# Patient Record
Sex: Female | Born: 1950
Health system: Southern US, Community
[De-identification: ages and names within clinical notes are randomized; demographics above are authoritative.]

## PROBLEM LIST (undated history)

## (undated) DIAGNOSIS — M1990 Unspecified osteoarthritis, unspecified site: Secondary | ICD-10-CM

## (undated) DIAGNOSIS — C4492 Squamous cell carcinoma of skin, unspecified: Secondary | ICD-10-CM

## (undated) DIAGNOSIS — K5900 Constipation, unspecified: Secondary | ICD-10-CM

## (undated) DIAGNOSIS — E785 Hyperlipidemia, unspecified: Secondary | ICD-10-CM

## (undated) DIAGNOSIS — J939 Pneumothorax, unspecified: Secondary | ICD-10-CM

## (undated) DIAGNOSIS — C4491 Basal cell carcinoma of skin, unspecified: Secondary | ICD-10-CM

## (undated) DIAGNOSIS — E538 Deficiency of other specified B group vitamins: Secondary | ICD-10-CM

## (undated) DIAGNOSIS — E559 Vitamin D deficiency, unspecified: Secondary | ICD-10-CM

## (undated) HISTORY — PX: ABDOMINAL HYSTERECTOMY: SHX81

## (undated) HISTORY — PX: KNEE SURGERY: SHX244

## (undated) HISTORY — DX: Basal cell carcinoma of skin, unspecified: C44.91

## (undated) HISTORY — DX: Constipation, unspecified: K59.00

## (undated) HISTORY — DX: Deficiency of other specified B group vitamins: E53.8

## (undated) HISTORY — PX: RECONSTRUCTION OF EYELID: SHX6576

## (undated) HISTORY — DX: Vitamin D deficiency, unspecified: E55.9

## (undated) HISTORY — DX: Hyperlipidemia, unspecified: E78.5

## (undated) HISTORY — PX: WISDOM TOOTH EXTRACTION: SHX21

## (undated) HISTORY — PX: APPENDECTOMY: SHX54

## (undated) HISTORY — DX: Squamous cell carcinoma of skin, unspecified: C44.92

## (undated) HISTORY — DX: Unspecified osteoarthritis, unspecified site: M19.90

## (undated) HISTORY — PX: TUBAL LIGATION: SHX77

## (undated) HISTORY — DX: Pneumothorax, unspecified: J93.9

## (undated) MED FILL — Nivolumab IV Soln 100 MG/10ML: INTRAVENOUS | Qty: 48 | Status: AC

## (undated) MED FILL — Nivolumab IV Soln 100 MG/10ML: INTRAVENOUS | Qty: 21.1 | Status: AC

---

## 2017-10-06 DIAGNOSIS — Z01818 Encounter for other preprocedural examination: Secondary | ICD-10-CM | POA: Diagnosis not present

## 2017-11-04 DIAGNOSIS — F418 Other specified anxiety disorders: Secondary | ICD-10-CM | POA: Diagnosis not present

## 2017-11-04 DIAGNOSIS — R079 Chest pain, unspecified: Secondary | ICD-10-CM | POA: Diagnosis not present

## 2017-11-04 DIAGNOSIS — K219 Gastro-esophageal reflux disease without esophagitis: Secondary | ICD-10-CM | POA: Diagnosis not present

## 2017-11-04 DIAGNOSIS — M1711 Unilateral primary osteoarthritis, right knee: Secondary | ICD-10-CM | POA: Diagnosis not present

## 2017-11-04 DIAGNOSIS — I5033 Acute on chronic diastolic (congestive) heart failure: Secondary | ICD-10-CM | POA: Diagnosis not present

## 2017-11-04 DIAGNOSIS — E7800 Pure hypercholesterolemia, unspecified: Secondary | ICD-10-CM | POA: Diagnosis not present

## 2017-11-05 DIAGNOSIS — E7800 Pure hypercholesterolemia, unspecified: Secondary | ICD-10-CM | POA: Diagnosis not present

## 2017-11-05 DIAGNOSIS — I5033 Acute on chronic diastolic (congestive) heart failure: Secondary | ICD-10-CM | POA: Diagnosis not present

## 2017-11-05 DIAGNOSIS — K219 Gastro-esophageal reflux disease without esophagitis: Secondary | ICD-10-CM | POA: Diagnosis not present

## 2017-11-05 DIAGNOSIS — M1711 Unilateral primary osteoarthritis, right knee: Secondary | ICD-10-CM | POA: Diagnosis not present

## 2017-11-05 DIAGNOSIS — F418 Other specified anxiety disorders: Secondary | ICD-10-CM | POA: Diagnosis not present

## 2017-11-06 DIAGNOSIS — M1711 Unilateral primary osteoarthritis, right knee: Secondary | ICD-10-CM | POA: Diagnosis not present

## 2017-11-06 DIAGNOSIS — I5033 Acute on chronic diastolic (congestive) heart failure: Secondary | ICD-10-CM | POA: Diagnosis not present

## 2017-11-06 DIAGNOSIS — F418 Other specified anxiety disorders: Secondary | ICD-10-CM | POA: Diagnosis not present

## 2017-11-06 DIAGNOSIS — E7800 Pure hypercholesterolemia, unspecified: Secondary | ICD-10-CM | POA: Diagnosis not present

## 2017-11-07 DIAGNOSIS — F418 Other specified anxiety disorders: Secondary | ICD-10-CM | POA: Diagnosis not present

## 2017-11-07 DIAGNOSIS — E7800 Pure hypercholesterolemia, unspecified: Secondary | ICD-10-CM | POA: Diagnosis not present

## 2017-11-07 DIAGNOSIS — I5033 Acute on chronic diastolic (congestive) heart failure: Secondary | ICD-10-CM | POA: Diagnosis not present

## 2017-11-07 DIAGNOSIS — M1711 Unilateral primary osteoarthritis, right knee: Secondary | ICD-10-CM | POA: Diagnosis not present

## 2018-03-11 ENCOUNTER — Encounter: Payer: Self-pay | Admitting: Gastroenterology

## 2020-02-01 DIAGNOSIS — C44722 Squamous cell carcinoma of skin of right lower limb, including hip: Secondary | ICD-10-CM | POA: Diagnosis not present

## 2020-02-01 DIAGNOSIS — C44212 Basal cell carcinoma of skin of right ear and external auricular canal: Secondary | ICD-10-CM | POA: Diagnosis not present

## 2020-02-01 DIAGNOSIS — C44319 Basal cell carcinoma of skin of other parts of face: Secondary | ICD-10-CM | POA: Diagnosis not present

## 2020-02-01 DIAGNOSIS — D485 Neoplasm of uncertain behavior of skin: Secondary | ICD-10-CM | POA: Diagnosis not present

## 2020-02-17 DIAGNOSIS — E349 Endocrine disorder, unspecified: Secondary | ICD-10-CM | POA: Diagnosis not present

## 2020-02-17 DIAGNOSIS — Z1231 Encounter for screening mammogram for malignant neoplasm of breast: Secondary | ICD-10-CM | POA: Diagnosis not present

## 2020-02-17 DIAGNOSIS — Z01419 Encounter for gynecological examination (general) (routine) without abnormal findings: Secondary | ICD-10-CM | POA: Diagnosis not present

## 2020-02-17 DIAGNOSIS — Z780 Asymptomatic menopausal state: Secondary | ICD-10-CM | POA: Diagnosis not present

## 2020-02-17 DIAGNOSIS — F329 Major depressive disorder, single episode, unspecified: Secondary | ICD-10-CM | POA: Diagnosis not present

## 2020-02-21 DIAGNOSIS — C44212 Basal cell carcinoma of skin of right ear and external auricular canal: Secondary | ICD-10-CM | POA: Diagnosis not present

## 2020-02-21 DIAGNOSIS — C44319 Basal cell carcinoma of skin of other parts of face: Secondary | ICD-10-CM | POA: Diagnosis not present

## 2020-02-28 DIAGNOSIS — E7800 Pure hypercholesterolemia, unspecified: Secondary | ICD-10-CM | POA: Diagnosis not present

## 2020-02-28 DIAGNOSIS — Z79899 Other long term (current) drug therapy: Secondary | ICD-10-CM | POA: Diagnosis not present

## 2020-02-28 DIAGNOSIS — R739 Hyperglycemia, unspecified: Secondary | ICD-10-CM | POA: Diagnosis not present

## 2020-03-07 DIAGNOSIS — M4800 Spinal stenosis, site unspecified: Secondary | ICD-10-CM | POA: Diagnosis not present

## 2020-03-07 DIAGNOSIS — Z6827 Body mass index (BMI) 27.0-27.9, adult: Secondary | ICD-10-CM | POA: Diagnosis not present

## 2020-03-07 DIAGNOSIS — J309 Allergic rhinitis, unspecified: Secondary | ICD-10-CM | POA: Diagnosis not present

## 2020-03-07 DIAGNOSIS — E7800 Pure hypercholesterolemia, unspecified: Secondary | ICD-10-CM | POA: Diagnosis not present

## 2020-03-07 DIAGNOSIS — E538 Deficiency of other specified B group vitamins: Secondary | ICD-10-CM | POA: Diagnosis not present

## 2020-03-07 DIAGNOSIS — I10 Essential (primary) hypertension: Secondary | ICD-10-CM | POA: Diagnosis not present

## 2020-03-07 DIAGNOSIS — Z0000 Encounter for general adult medical examination without abnormal findings: Secondary | ICD-10-CM | POA: Diagnosis not present

## 2020-03-13 DIAGNOSIS — H8109 Meniere's disease, unspecified ear: Secondary | ICD-10-CM | POA: Diagnosis not present

## 2020-03-13 DIAGNOSIS — H903 Sensorineural hearing loss, bilateral: Secondary | ICD-10-CM | POA: Diagnosis not present

## 2020-03-20 DIAGNOSIS — C44212 Basal cell carcinoma of skin of right ear and external auricular canal: Secondary | ICD-10-CM | POA: Diagnosis not present

## 2020-03-21 DIAGNOSIS — H8103 Meniere's disease, bilateral: Secondary | ICD-10-CM | POA: Diagnosis not present

## 2020-06-15 DIAGNOSIS — E538 Deficiency of other specified B group vitamins: Secondary | ICD-10-CM | POA: Diagnosis not present

## 2020-06-15 DIAGNOSIS — R5383 Other fatigue: Secondary | ICD-10-CM | POA: Diagnosis not present

## 2020-06-15 DIAGNOSIS — Z6826 Body mass index (BMI) 26.0-26.9, adult: Secondary | ICD-10-CM | POA: Diagnosis not present

## 2020-06-27 DIAGNOSIS — H8103 Meniere's disease, bilateral: Secondary | ICD-10-CM | POA: Diagnosis not present

## 2020-07-14 DIAGNOSIS — D692 Other nonthrombocytopenic purpura: Secondary | ICD-10-CM | POA: Diagnosis not present

## 2020-07-14 DIAGNOSIS — Z85828 Personal history of other malignant neoplasm of skin: Secondary | ICD-10-CM | POA: Diagnosis not present

## 2020-07-14 DIAGNOSIS — L905 Scar conditions and fibrosis of skin: Secondary | ICD-10-CM | POA: Diagnosis not present

## 2020-07-14 DIAGNOSIS — C44311 Basal cell carcinoma of skin of nose: Secondary | ICD-10-CM | POA: Diagnosis not present

## 2020-07-14 DIAGNOSIS — C44722 Squamous cell carcinoma of skin of right lower limb, including hip: Secondary | ICD-10-CM | POA: Diagnosis not present

## 2020-07-14 DIAGNOSIS — C44519 Basal cell carcinoma of skin of other part of trunk: Secondary | ICD-10-CM | POA: Diagnosis not present

## 2020-07-14 DIAGNOSIS — C44529 Squamous cell carcinoma of skin of other part of trunk: Secondary | ICD-10-CM | POA: Diagnosis not present

## 2020-07-14 DIAGNOSIS — D485 Neoplasm of uncertain behavior of skin: Secondary | ICD-10-CM | POA: Diagnosis not present

## 2020-07-24 DIAGNOSIS — C44529 Squamous cell carcinoma of skin of other part of trunk: Secondary | ICD-10-CM | POA: Diagnosis not present

## 2020-07-24 DIAGNOSIS — C44722 Squamous cell carcinoma of skin of right lower limb, including hip: Secondary | ICD-10-CM | POA: Diagnosis not present

## 2020-07-24 DIAGNOSIS — C44511 Basal cell carcinoma of skin of breast: Secondary | ICD-10-CM | POA: Diagnosis not present

## 2020-07-24 DIAGNOSIS — C44311 Basal cell carcinoma of skin of nose: Secondary | ICD-10-CM | POA: Diagnosis not present

## 2020-07-31 DIAGNOSIS — Z20828 Contact with and (suspected) exposure to other viral communicable diseases: Secondary | ICD-10-CM | POA: Diagnosis not present

## 2020-07-31 DIAGNOSIS — J0120 Acute ethmoidal sinusitis, unspecified: Secondary | ICD-10-CM | POA: Diagnosis not present

## 2020-08-10 DIAGNOSIS — D045 Carcinoma in situ of skin of trunk: Secondary | ICD-10-CM | POA: Diagnosis not present

## 2020-08-10 DIAGNOSIS — D0472 Carcinoma in situ of skin of left lower limb, including hip: Secondary | ICD-10-CM | POA: Diagnosis not present

## 2020-08-10 DIAGNOSIS — C44311 Basal cell carcinoma of skin of nose: Secondary | ICD-10-CM | POA: Diagnosis not present

## 2020-09-14 DIAGNOSIS — R0781 Pleurodynia: Secondary | ICD-10-CM | POA: Diagnosis not present

## 2020-09-21 DIAGNOSIS — R160 Hepatomegaly, not elsewhere classified: Secondary | ICD-10-CM | POA: Diagnosis not present

## 2020-09-21 DIAGNOSIS — J432 Centrilobular emphysema: Secondary | ICD-10-CM | POA: Diagnosis not present

## 2020-09-21 DIAGNOSIS — J90 Pleural effusion, not elsewhere classified: Secondary | ICD-10-CM | POA: Diagnosis not present

## 2020-09-21 DIAGNOSIS — Z902 Acquired absence of lung [part of]: Secondary | ICD-10-CM | POA: Diagnosis not present

## 2020-09-21 DIAGNOSIS — I700 Atherosclerosis of aorta: Secondary | ICD-10-CM | POA: Diagnosis not present

## 2020-09-28 DIAGNOSIS — R160 Hepatomegaly, not elsewhere classified: Secondary | ICD-10-CM | POA: Diagnosis not present

## 2020-09-28 DIAGNOSIS — N2889 Other specified disorders of kidney and ureter: Secondary | ICD-10-CM | POA: Diagnosis not present

## 2020-09-28 DIAGNOSIS — K760 Fatty (change of) liver, not elsewhere classified: Secondary | ICD-10-CM | POA: Diagnosis not present

## 2020-09-28 DIAGNOSIS — I700 Atherosclerosis of aorta: Secondary | ICD-10-CM | POA: Diagnosis not present

## 2020-09-28 DIAGNOSIS — J90 Pleural effusion, not elsewhere classified: Secondary | ICD-10-CM | POA: Diagnosis not present

## 2020-10-05 DIAGNOSIS — H8103 Meniere's disease, bilateral: Secondary | ICD-10-CM | POA: Diagnosis not present

## 2020-10-06 ENCOUNTER — Other Ambulatory Visit (HOSPITAL_COMMUNITY): Payer: Self-pay | Admitting: Urology

## 2020-10-06 DIAGNOSIS — D49511 Neoplasm of unspecified behavior of right kidney: Secondary | ICD-10-CM | POA: Diagnosis not present

## 2020-10-06 DIAGNOSIS — D376 Neoplasm of uncertain behavior of liver, gallbladder and bile ducts: Secondary | ICD-10-CM | POA: Diagnosis not present

## 2020-10-10 ENCOUNTER — Encounter (HOSPITAL_COMMUNITY): Payer: Self-pay

## 2020-10-10 NOTE — Progress Notes (Signed)
[place] Female, 69 y.o., 08/29/1951 MRN:  [id number] Phone:  [phone] (H) PCP:  Mateo Flow, MD Coverage:  [employer] [employer]  RE: Biopsy Received: Today Message Details  Greggory Keen, MD  Lenore Cordia Ceasar Mons, MD Dr Lovena Neighbours is off today so I cc'd him on this message.   Large right liver dome lesions appear to be identical to normal liver and likely benign contour irregularity from previous trauma or diaphragmatic hernia.   The other very small 1.8 cm right liver dome lesion described on the MR is very high and would be difficult to target with [place] or CT to confidently bx.   I reviewed with Drs Earleen Newport and [facility]), and we agree that surveillance with MR in 3-6 months is best option. It would be unlikely to be an isolated small renal met to the liver.   Dr Lovena Neighbours, If you have any questions or want to discuss further please reach out to me when you are back this week.   We will cancel this bx request.   Thanks  Daryll Brod   Previous Messages  ----- Message -----  From: Lenore Cordia  Sent: 10/09/2020  4:33 PM EDT  To: [facility] Procedure Requests  Subject: Biopsy                      Procedure Requested: Interventional Radiology Without Contrast    Reason for Procedure: Biopsy of Liver Mass    Provider Requesting: Ellison Hughs  Provider Telephone: [phone]    Other Info:

## 2020-10-17 ENCOUNTER — Other Ambulatory Visit: Payer: Self-pay | Admitting: Urology

## 2020-11-14 DIAGNOSIS — D49511 Neoplasm of unspecified behavior of right kidney: Secondary | ICD-10-CM | POA: Diagnosis not present

## 2020-11-14 NOTE — Patient Instructions (Addendum)
DUE TO COVID-19 ONLY ONE VISITOR IS ALLOWED TO COME WITH YOU AND STAY IN THE WAITING ROOM ONLY DURING PRE OP AND PROCEDURE.   IF YOU WILL BE ADMITTED INTO THE HOSPITAL YOU ARE ALLOWED ONE SUPPORT PERSON DURING VISITATION HOURS ONLY (10AM -8PM)   . The support person may change daily. . The support person must pass our screening, gel in and out, and wear a mask at all times, including in the patient's room. . Patients must also wear a mask when staff or their support person are in the room.   COVID SWAB TESTING MUST BE COMPLETED ON:  Tuesday, 11-21-20 @   [address] [place], [place] [address]  (Must self quarantine after testing. Follow instructions on handout.)        Your procedure is scheduled on:  Friday, 11-24-20   Report to [facility] Main  Entrance   Report to admitting at 11:30 AM   Call this number if you have problems the morning of surgery [phone]   Do not eat food :After Midnight.   May have liquids until 10:30 AM day of surgery  CLEAR LIQUID DIET  Foods Allowed                                                                     Foods Excluded  Water, Black Coffee and tea, regular and decaf              liquids that you cannot  Plain Jell-O in any flavor  (No red)                                     see through such as: Fruit ices (not with fruit pulp)                                      milk, soups, orange juice              Iced Popsicles (No red)                                      All solid food                                   Apple juices Sports drinks like Gatorade (No red) Lightly seasoned clear broth or consume(fat free) Sugar, honey syrup    Oral Hygiene is also important to reduce your risk of infection.                                    Remember - BRUSH YOUR TEETH THE MORNING OF SURGERY WITH YOUR REGULAR TOOTHPASTE   Do NOT smoke after Midnight   Take these medicines the morning of surgery with A SIP OF WATER:  Celexa, Pepcid  You may not have any metal on your body including hair pins, jewelry, and body piercings             Do not wear make-up, lotions, powders, perfumes/cologne, or deodorant             Do not wear nail polish.  Do not shave  48 hours prior to surgery.              Do not bring valuables to the hospital. [place].   Contacts, dentures or bridgework may not be worn into surgery.   Bring small overnight bag day of surgery.    Special Instructions: Bring a copy of your healthcare power of attorney and living will documents         the day of surgery if you haven't scanned them in before.              Please read over the following fact sheets you were given: IF YOU HAVE QUESTIONS ABOUT YOUR PRE OP INSTRUCTIONS PLEASE CALL [phone]   [place] - Preparing for Surgery Before surgery, you can play an important role.  Because skin is not sterile, your skin needs to be as free of germs as possible.  You can reduce the number of germs on your skin by washing with CHG (chlorahexidine gluconate) soap before surgery.  CHG is an antiseptic cleaner which kills germs and bonds with the skin to continue killing germs even after washing. Please DO NOT use if you have an allergy to CHG or antibacterial soaps.  If your skin becomes reddened/irritated stop using the CHG and inform your nurse when you arrive at Short Stay. Do not shave (including legs and underarms) for at least 48 hours prior to the first CHG shower.  You may shave your face/neck.  Please follow these instructions carefully:  1.  Shower with CHG Soap the night before surgery and the  morning of surgery.  2.  If you choose to wash your hair, wash your hair first as usual with your normal  shampoo.  3.  After you shampoo, rinse your hair and body thoroughly to remove the shampoo.                             4.  Use CHG as you would any other liquid soap.  You can apply chg  directly to the skin and wash.  Gently with a scrungie or clean washcloth.  5.  Apply the CHG Soap to your body ONLY FROM THE NECK DOWN.   Do   not use on face/ open                           Wound or open sores. Avoid contact with eyes, ears mouth and   genitals (private parts).                       Wash face,  Genitals (private parts) with your normal soap.             6.  Wash thoroughly, paying special attention to the area where your    surgery  will be performed.  7.  Thoroughly rinse your body with warm water from the neck down.  8.  DO NOT shower/wash with your normal soap after using and rinsing off the CHG Soap.  9.  Pat yourself dry with a clean towel.            10.  Wear clean pajamas.            11.  Place clean sheets on your bed the night of your first shower and do not  sleep with pets. Day of Surgery : Do not apply any lotions/deodorants the morning of surgery.  Please wear clean clothes to the hospital/surgery center.  FAILURE TO FOLLOW THESE INSTRUCTIONS MAY RESULT IN THE CANCELLATION OF YOUR SURGERY  PATIENT SIGNATURE_________________________________  NURSE SIGNATURE__________________________________  ________________________________________________________________________  WHAT IS A BLOOD TRANSFUSION? Blood Transfusion Information  A transfusion is the replacement of blood or some of its parts. Blood is made up of multiple cells which provide different functions.  Red blood cells carry oxygen and are used for blood loss replacement.  White blood cells fight against infection.  Platelets control bleeding.  Plasma helps clot blood.  Other blood products are available for specialized needs, such as hemophilia or other clotting disorders. BEFORE THE TRANSFUSION  Who gives blood for transfusions?   Healthy volunteers who are fully evaluated to make sure their blood is safe. This is blood bank blood. Transfusion therapy is the safest it has ever  been in the practice of medicine. Before blood is taken from a donor, a complete history is taken to make sure that person has no history of diseases nor engages in risky social behavior (examples are intravenous drug use or sexual activity with multiple partners). The donor's travel history is screened to minimize risk of transmitting infections, such as malaria. The donated blood is tested for signs of infectious diseases, such as HIV and hepatitis. The blood is then tested to be sure it is compatible with you in order to minimize the chance of a transfusion reaction. If you or a relative donates blood, this is often done in anticipation of surgery and is not appropriate for emergency situations. It takes many days to process the donated blood. RISKS AND COMPLICATIONS Although transfusion therapy is very safe and saves many lives, the main dangers of transfusion include:   Getting an infectious disease.  Developing a transfusion reaction. This is an allergic reaction to something in the blood you were given. Every precaution is taken to prevent this. The decision to have a blood transfusion has been considered carefully by your caregiver before blood is given. Blood is not given unless the benefits outweigh the risks. AFTER THE TRANSFUSION  Right after receiving a blood transfusion, you will usually feel much better and more energetic. This is especially true if your red blood cells have gotten low (anemic). The transfusion raises the level of the red blood cells which carry oxygen, and this usually causes an energy increase.  The nurse administering the transfusion will monitor you carefully for complications. HOME CARE INSTRUCTIONS  No special instructions are needed after a transfusion. You may find your energy is better. Speak with your caregiver about any limitations on activity for underlying diseases you may have. SEEK MEDICAL CARE IF:   Your condition is not improving after your  transfusion.  You develop redness or irritation at the intravenous (IV) site. SEEK IMMEDIATE MEDICAL CARE IF:  Any of the following symptoms occur over the next 12 hours:  Shaking chills.  You have a temperature by mouth above 102 F (38.9 C), not controlled by medicine.  Chest, back, or muscle pain.  People around you feel you are not acting correctly  or are confused.  Shortness of breath or difficulty breathing.  Dizziness and fainting.  You get a rash or develop hives.  You have a decrease in urine output.  Your urine turns a dark color or changes to pink, red, or brown. Any of the following symptoms occur over the next 10 days:  You have a temperature by mouth above 102 F (38.9 C), not controlled by medicine.  Shortness of breath.  Weakness after normal activity.  The white part of the eye turns yellow (jaundice).  You have a decrease in the amount of urine or are urinating less often.  Your urine turns a dark color or changes to pink, red, or brown. Document Released: 12/06/2000 Document Revised: 03/02/2012 Document Reviewed: 07/25/2008 ExitCare Patient Information 2014 [place].  _______________________________________________________________________   Incentive Spirometer  An incentive spirometer is a tool that can help keep your lungs clear and active. This tool measures how well you are filling your lungs with each breath. Taking long deep breaths may help reverse or decrease the chance of developing breathing (pulmonary) problems (especially infection) following:  A long period of time when you are unable to move or be active. BEFORE THE PROCEDURE   If the spirometer includes an indicator to show your best effort, your nurse or respiratory therapist will set it to a desired goal.  If possible, sit up straight or lean slightly forward. Try not to slouch.  Hold the incentive spirometer in an upright position. INSTRUCTIONS FOR USE  1. Sit on the  edge of your bed if possible, or sit up as far as you can in bed or on a chair. 2. Hold the incentive spirometer in an upright position. 3. Breathe out normally. 4. Place the mouthpiece in your mouth and seal your lips tightly around it. 5. Breathe in slowly and as deeply as possible, raising the piston or the ball toward the top of the column. 6. Hold your breath for 3-5 seconds or for as long as possible. Allow the piston or ball to fall to the bottom of the column. 7. Remove the mouthpiece from your mouth and breathe out normally. 8. Rest for a few seconds and repeat Steps 1 through 7 at least 10 times every 1-2 hours when you are awake. Take your time and take a few normal breaths between deep breaths. 9. The spirometer may include an indicator to show your best effort. Use the indicator as a goal to work toward during each repetition. 10. After each set of 10 deep breaths, practice coughing to be sure your lungs are clear. If you have an incision (the cut made at the time of surgery), support your incision when coughing by placing a pillow or rolled up towels firmly against it. Once you are able to get out of bed, walk around indoors and cough well. You may stop using the incentive spirometer when instructed by your caregiver.  RISKS AND COMPLICATIONS  Take your time so you do not get dizzy or light-headed.  If you are in pain, you may need to take or ask for pain medication before doing incentive spirometry. It is harder to take a deep breath if you are having pain. AFTER USE  Rest and breathe slowly and easily.  It can be helpful to keep track of a log of your progress. Your caregiver can provide you with a simple table to help with this. If you are using the spirometer at home, follow these instructions: [place] IF:  You are having difficultly using the spirometer.  You have trouble using the spirometer as often as instructed.  Your pain medication is not giving enough  relief while using the spirometer.  You develop fever of 100.5 F (38.1 C) or higher. SEEK IMMEDIATE MEDICAL CARE IF:   You cough up bloody sputum that had not been present before.  You develop fever of 102 F (38.9 C) or greater.  You develop worsening pain at or near the incision site. MAKE SURE YOU:   Understand these instructions.  Will watch your condition.  Will get help right away if you are not doing well or get worse. Document Released: 04/21/2007 Document Revised: 03/02/2012 Document Reviewed: 06/22/2007 [facility] Patient Information 2014 [place], [place].   ________________________________________________________________________

## 2020-11-14 NOTE — Progress Notes (Signed)
COVID Vaccine Completed: Date COVID Vaccine completed: COVID vaccine manufacturer: [employer]   PCP - Bertram Millard, MD Cardiologist -   Chest x-ray -  EKG -  Stress Test -  ECHO -  Cardiac Cath -  Pacemaker/ICD device last checked:  Sleep Study -  CPAP -   Fasting Blood Sugar -  Checks Blood Sugar _____ times a day  Blood Thinner Instructions: Aspirin Instructions: Last Dose:  Anesthesia review:   Patient denies shortness of breath, fever, cough and chest pain at PAT appointment   Patient verbalized understanding of instructions that were given to them at the PAT appointment. Patient was also instructed that they will need to review over the PAT instructions again at home before surgery.

## 2020-11-15 ENCOUNTER — Encounter (HOSPITAL_COMMUNITY)
Admission: RE | Admit: 2020-11-15 | Discharge: 2020-11-15 | Disposition: A | Discharge status: Home / Self Care | Payer: Medicare PPO | Source: Ambulatory Visit | Attending: Urology | Admitting: Urology

## 2020-11-28 ENCOUNTER — Other Ambulatory Visit (HOSPITAL_COMMUNITY)
Admission: RE | Admit: 2020-11-28 | Discharge: 2020-11-28 | Disposition: A | Discharge status: Home / Self Care | Payer: Medicare PPO | Source: Ambulatory Visit | Attending: Pulmonary Disease | Admitting: Pulmonary Disease

## 2020-11-28 ENCOUNTER — Ambulatory Visit (INDEPENDENT_AMBULATORY_CARE_PROVIDER_SITE_OTHER): Payer: Medicare PPO

## 2020-11-28 ENCOUNTER — Other Ambulatory Visit: Payer: Self-pay

## 2020-11-28 ENCOUNTER — Encounter: Payer: Self-pay | Admitting: Pulmonary Disease

## 2020-11-28 ENCOUNTER — Ambulatory Visit: Payer: Medicare PPO | Admitting: Pulmonary Disease

## 2020-11-28 VITALS — BP 114/64 | HR 58 | Ht 64.0 in | Wt 153.6 lb

## 2020-11-28 DIAGNOSIS — J432 Centrilobular emphysema: Secondary | ICD-10-CM

## 2020-11-28 DIAGNOSIS — D49511 Neoplasm of unspecified behavior of right kidney: Secondary | ICD-10-CM

## 2020-11-28 DIAGNOSIS — R0602 Shortness of breath: Secondary | ICD-10-CM

## 2020-11-28 DIAGNOSIS — J90 Pleural effusion, not elsewhere classified: Secondary | ICD-10-CM

## 2020-11-28 DIAGNOSIS — J9811 Atelectasis: Secondary | ICD-10-CM | POA: Diagnosis not present

## 2020-11-28 DIAGNOSIS — Z85828 Personal history of other malignant neoplasm of skin: Secondary | ICD-10-CM | POA: Diagnosis not present

## 2020-11-28 DIAGNOSIS — C649 Malignant neoplasm of unspecified kidney, except renal pelvis: Secondary | ICD-10-CM | POA: Diagnosis not present

## 2020-11-28 DIAGNOSIS — N2889 Other specified disorders of kidney and ureter: Secondary | ICD-10-CM

## 2020-11-28 DIAGNOSIS — J910 Malignant pleural effusion: Secondary | ICD-10-CM | POA: Diagnosis not present

## 2020-11-28 DIAGNOSIS — R918 Other nonspecific abnormal finding of lung field: Secondary | ICD-10-CM | POA: Diagnosis not present

## 2020-11-28 DIAGNOSIS — C801 Malignant (primary) neoplasm, unspecified: Secondary | ICD-10-CM | POA: Diagnosis not present

## 2020-11-28 DIAGNOSIS — C782 Secondary malignant neoplasm of pleura: Secondary | ICD-10-CM | POA: Diagnosis not present

## 2020-11-28 LAB — BODY FLUID CELL COUNT WITH DIFFERENTIAL
Lymphs, Fluid: 22 %
Monocyte-Macrophage-Serous Fluid: 77 % (ref 50–90)
Neutrophil Count, Fluid: 1 % (ref 0–25)
Total Nucleated Cell Count, Fluid: 961 cu mm (ref 0–1000)

## 2020-11-28 LAB — COMPREHENSIVE METABOLIC PANEL
ALT: 21 U/L (ref 0–35)
AST: 22 U/L (ref 0–37)
Albumin: 4.3 g/dL (ref 3.5–5.2)
Alkaline Phosphatase: 66 U/L (ref 39–117)
BUN: 14 mg/dL (ref 6–23)
CO2: 26 mEq/L (ref 19–32)
Calcium: 8.8 mg/dL (ref 8.4–10.5)
Chloride: 104 mEq/L (ref 96–112)
Creatinine, Ser: 0.97 mg/dL (ref 0.40–1.20)
GFR: 58.62 mL/min — ABNORMAL LOW (ref >60.00)
Glucose, Bld: 98 mg/dL (ref 70–99)
Potassium: 3.9 mEq/L (ref 3.5–5.1)
Sodium: 138 mEq/L (ref 135–145)
Total Bilirubin: 0.3 mg/dL (ref 0.2–1.2)
Total Protein: 6.9 g/dL (ref 6.0–8.3)

## 2020-11-28 LAB — CBC
HCT: 41.8 % (ref 36.0–46.0)
Hemoglobin: 14 g/dL (ref 12.0–15.0)
MCHC: 33.4 g/dL (ref 30.0–36.0)
MCV: 97.2 fl (ref 78.0–100.0)
Platelets: 202 10*3/uL (ref 150.0–400.0)
RBC: 4.3 Mil/uL (ref 3.87–5.11)
RDW: 13.6 % (ref 11.5–15.5)
WBC: 8 10*3/uL (ref 4.0–10.5)

## 2020-11-28 LAB — PROTIME-INR
INR: 1 ratio (ref 0.8–1.0)
Prothrombin Time: 10.7 s (ref 9.6–13.1)

## 2020-11-28 LAB — LACTATE DEHYDROGENASE, PLEURAL OR PERITONEAL FLUID: LD, Fluid: 216 U/L — ABNORMAL HIGH (ref 3–23)

## 2020-11-28 LAB — CREATININE, FLUID (PLEURAL, PERITONEAL, JP DRAINAGE): Creat, Fluid: 0.8 mg/dL

## 2020-11-28 LAB — ALBUMIN, PLEURAL OR PERITONEAL FLUID: Albumin, Fluid: 3.4 g/dL

## 2020-11-28 LAB — PROTEIN, PLEURAL OR PERITONEAL FLUID: Total protein, fluid: 4.5 g/dL

## 2020-11-28 LAB — GLUCOSE, PLEURAL OR PERITONEAL FLUID: Glucose, Fluid: 110 mg/dL

## 2020-11-28 NOTE — Patient Instructions (Signed)
We will call you with the results of the thoracentesis by the end of the week.   We will schedule follow up according to the results of the pleural fluid  Please call if you have worsening pain or shortness of breath after the procedure.

## 2020-11-28 NOTE — Progress Notes (Signed)
Thoracentesis  Procedure Note  [name] [name]  [id number]  02/17/1951  Date:11/28/20  Time:1:37 PM   Provider Performing:Kimila Papaleo B Chrysta Fulcher   Procedure: Thoracentesis with imaging guidance ([address])  Indication(s) Pleural Effusion  Consent Risks of the procedure as well as the alternatives and risks of each were explained to the patient and/or caregiver.  Consent for the procedure was obtained and is signed in the bedside chart  Anesthesia Topical only with 1% lidocaine    Time Out Verified patient identification, verified procedure, site/side was marked, verified correct patient position, special equipment/implants available, medications/allergies/relevant history reviewed, required imaging and test results available.   Sterile Technique Maximal sterile technique including full sterile barrier drape, hand hygiene, sterile gown, sterile gloves, mask, hair covering, sterile ultrasound probe cover (if used).  Procedure Description Ultrasound was used to identify appropriate pleural anatomy for placement and overlying skin marked.  Area of drainage cleaned and draped in sterile fashion. Lidocaine was used to anesthetize the skin and subcutaneous tissue.  600 cc's of cloudy straw colored appearing fluid was drained from the right pleural space. Catheter then removed and bandaid applied to site.   Complications/Tolerance None; patient tolerated the procedure well. Chest X-ray is ordered to confirm no post-procedural complication.   EBL Minimal   Specimen(s) Pleural fluid

## 2020-11-28 NOTE — Progress Notes (Signed)
Synopsis: Referred by Ellison Hughs, MD for pleural effusion, cough and dyspnea  Subjective:   PATIENT ID: [name] [name] GENDER: female DOB: April 02, 1951, MRN: [id number]   HPI  Chief Complaint  Patient presents with  . Consult    Pt is here due to having pleural effusion seen on imaging. Pt does have problems with cough, SOB, and chest discomfort from coughing.   [name] [name] is a 69 year old woman, former smoker with history of GERD, seasonal allergies and Meniere's Disease who is referred to pulmonary clinic for evaluation of pleural effusion and shortness of breath.   She underwent workup by her PCP in 9/2061for cough and chest tightness. She had a chest radiograph first that showed fluid on right lung. She then had a CT Chest scan on 08/25/20 which showed large right pleural effusion and multiple exophytic liver masses. An MRI of her abdomen was obtained on 09/28/20 which showed Avidly enhancing 4.3 cm renal cortical mass in the lateral lower right kidney, compatible with clear cell renal cell carcinoma. Avidly enhancing 1.8 cm segment 7 right liver dome mass with subtle diffusion signal abnormality, indeterminate, cannot exclude a solitary liver metastasis. She reports she was scheduled for liver biopsy but then this was decided against by the radiologist. She was seen in Dr. [facility] on 11/14/20 for surgical planning of the right renal mass but then surgery was postponed due to concern for the right pleural effusion in regards to her respiratory status for surgery. Surgery is planned for 12/29/2020 at this time.   She has had dyspnea and cough since September. The cough has become progressively worse and is mainly dry with rare, clear sputum production. She has exertional dyspnea. She has discomfort on her right side. She feels like something isn't right when she takes a deep breath. She denies hemoptysis. She denies weight loss, fevers, chills or sweats.   She has history of  bilateral spontaneous pneumothoraces that required surgical intervention and wedge resection. She has centrilobular emphysematous changes of the upper lobes which likely led to these pneumothoraces in the past.   She has history of basal and squamous cell skin cancers. Her mother has history of pancreatic cancer.     She is a former smoker and quit in 2001. She has about a 50 pack year history.   Past Medical History:  Diagnosis Date  . Bilateral pneumothoraces   . Carcinoma, basal cell, skin   . Squamous cell skin cancer      Family History  Problem Relation Age of Onset  . Pancreatic cancer Mother      Social History   Socioeconomic History  . Marital status: Married    Spouse name: Not on file  . Number of children: Not on file  . Years of education: Not on file  . Highest education level: Not on file  Occupational History  . Not on file  Tobacco Use  . Smoking status: Former Smoker    Packs/day: 1.50    Years: 33.00    Pack years: 49.50    Types: Cigarettes    Quit date: 09/12/2000    Years since quitting: 20.2  . Smokeless tobacco: Never Used  Substance and Sexual Activity  . Alcohol use: Not on file  . Drug use: Not on file  . Sexual activity: Not on file  Other Topics Concern  . Not on file  Social History Narrative  . Not on file   Social Determinants of Health   Financial  Resource Strain:   . Difficulty of Paying Living Expenses: Not on file  Food Insecurity:   . Worried About Charity fundraiser in the Last Year: Not on file  . Ran Out of Food in the Last Year: Not on file  Transportation Needs:   . Lack of Transportation (Medical): Not on file  . Lack of Transportation (Non-Medical): Not on file  Physical Activity:   . Days of Exercise per Week: Not on file  . Minutes of Exercise per Session: Not on file  Stress:   . Feeling of Stress : Not on file  Social Connections:   . Frequency of Communication with Friends and Family: Not on file  .  Frequency of Social Gatherings with Friends and Family: Not on file  . Attends Religious Services: Not on file  . Active Member of Clubs or Organizations: Not on file  . Attends Archivist Meetings: Not on file  . Marital Status: Not on file  Intimate Partner Violence:   . Fear of Current or Ex-Partner: Not on file  . Emotionally Abused: Not on file  . Physically Abused: Not on file  . Sexually Abused: Not on file     Allergies  Allergen Reactions  . Gentamicin     Eyes burning    . Codeine Itching    Itching inside of the body, and "crazy"  . Demerol Hcl [Meperidine] Itching  . Fml [Fluorometholone]     Eyes burning   . Morphine And Related Itching    Itching inside the body, and "crazy"  . Neomycin-Polymyxin-Gramicidin     Eyes burning   . Prednisone Itching  . Sulfa Antibiotics Itching  . Tobramycin     Eyes burning   . Tramadol Hcl Itching    Itching inside the body     Outpatient Medications Prior to Visit  Medication Sig Dispense Refill  . amitriptyline (ELAVIL) 25 MG tablet Take 25 mg by mouth daily.    Marland Kitchen atorvastatin (LIPITOR) 20 MG tablet Take 20 mg by mouth at bedtime.    . Cholecalciferol (VITAMIN D) 50 MCG (2000 UT) CAPS Take 2,000 Units by mouth daily.    . citalopram (CELEXA) 20 MG tablet Take 20 mg by mouth daily.    Marland Kitchen docusate sodium (COLACE) 100 MG capsule Take 100 mg by mouth daily.    Marland Kitchen estrogens, conjugated, (PREMARIN) 0.3 MG tablet Take 0.3 mg by mouth every other day.    . famotidine (PEPCID) 20 MG tablet Take 20 mg by mouth daily.    Marland Kitchen gabapentin (NEURONTIN) 100 MG capsule Take 200 mg by mouth at bedtime.    . Inulin (FIBER CHOICE PO) Take 1 tablet by mouth 3 times/day as needed-between meals & bedtime.    . naproxen sodium (ALEVE) 220 MG tablet Take 220 mg by mouth 2 (two) times daily with a meal.    . vitamin B-12 (CYANOCOBALAMIN) 1000 MCG tablet Take 1,000 mcg by mouth daily.     No facility-administered medications prior to  visit.    Review of Systems  Constitutional: Negative for chills, diaphoresis, fever, malaise/fatigue and weight loss.  HENT: Negative for congestion, sinus pain and sore throat.   Eyes: Negative.   Respiratory: Positive for cough and shortness of breath. Negative for hemoptysis, sputum production and wheezing.   Cardiovascular: Negative for chest pain, palpitations, orthopnea, leg swelling and PND.  Gastrointestinal: Negative for abdominal pain, heartburn, nausea and vomiting.  Genitourinary: Negative for dysuria and hematuria.  Musculoskeletal: Negative.   Skin: Negative.   Neurological: Negative for dizziness, weakness and headaches.  Endo/Heme/Allergies: Negative.   Psychiatric/Behavioral: Negative.     Objective:   Vitals:   11/28/20 0911  BP: 114/64  Pulse: (!) 58  SpO2: 95%  Weight: 153 lb 9.6 oz (69.7 kg)  Height: 5\' 4"  (1.626 m)     Physical Exam Constitutional:      General: She is not in acute distress.    Appearance: Normal appearance. She is normal weight. She is not ill-appearing.  HENT:     Head: Normocephalic and atraumatic.     Nose: Nose normal.     Mouth/Throat:     Mouth: Mucous membranes are moist.     Pharynx: Oropharynx is clear.  Eyes:     Conjunctiva/sclera: Conjunctivae normal.     Pupils: Pupils are equal, round, and reactive to light.  Cardiovascular:     Rate and Rhythm: Normal rate and regular rhythm.     Pulses: Normal pulses.     Heart sounds: Normal heart sounds. No murmur heard.   Pulmonary:     Effort: Pulmonary effort is normal. No respiratory distress.     Breath sounds: Decreased breath sounds (right mid and lower lung fields) present. No wheezing, rhonchi or rales.  Abdominal:     General: Bowel sounds are normal.     Palpations: Abdomen is soft.  Musculoskeletal:     Cervical back: Neck supple.     Right lower leg: No edema.     Left lower leg: No edema.  Lymphadenopathy:     Cervical: No cervical adenopathy.  Skin:     Capillary Refill: Capillary refill takes less than 2 seconds.     Findings: No lesion or rash.  Neurological:     General: No focal deficit present.     Mental Status: She is alert.  Psychiatric:        Mood and Affect: Mood normal.        Behavior: Behavior normal.        Thought Content: Thought content normal.        Judgment: Judgment normal.    CBC    Component Value Date/Time   WBC 8.0 11/28/2020 0955   RBC 4.30 11/28/2020 0955   HGB 14.0 11/28/2020 0955   HCT 41.8 11/28/2020 0955   PLT 202.0 11/28/2020 0955   MCV 97.2 11/28/2020 0955   MCHC 33.4 11/28/2020 0955   RDW 13.6 11/28/2020 0955    Chest imaging: CT Chest 08/25/20 Multiple exophytic masses arising from the posterior right hepatic dome, measuring up to 5.2 cm. This appearance is unusual but is certainly considered concerning for neoplasm. Consider MRI abdomen with/without contrast for further evaluation.  Associated moderate right pleural effusion and subpulmonic effusion.  Postsurgical changes related to right upper lobe wedge resection.  Aortic Atherosclerosis (ICD10-I70.0) and Emphysema (ICD10-J43.9).  MRI Abdomen 09/28/20 1. Avidly enhancing 4.3 cm renal cortical mass in the lateral lower right kidney, compatible with clear cell renal cell carcinoma. No evidence of renal vein tumor thrombus. 2. Avidly enhancing 1.8 cm segment 7 right liver dome mass with subtle diffusion signal abnormality, indeterminate, cannot exclude a solitary liver metastasis. No additional potential sites of metastatic disease in the abdomen. 3. Multiple separate exophytic similar-appearing solid liver masses arising superiorly from the posterior right liver dome, largest 5.4 cm, each demonstrating signal intensity and enhancement identical to the normal liver parenchyma, favoring benign contour irregularity of the liver, potentially the sequelae  of remote trauma. 4. Mild diffuse hepatic steatosis. 5. Moderate dependent  right pleural effusion.  PFT: No flowsheet data found.  Labs:  Echo: No echo on file  Heart Catheterization: No cath on file      Assessment & Plan:   Pleural effusion - Plan: INR/PT, CBC, Lactate Dehydrogenase (LDH), Lactate Dehydrogenase (LDH), CBC, INR/PT, Comprehensive metabolic panel, Comprehensive metabolic panel, DG Chest 1 View, Body Fluid Culture, Creatinine, fluid - peritoneal or pleural, Cholesterol, Pleural Fluid, Lactate dehydrogenase, fluid - peritoneal or pleural, Albumin, fluid - peritoneal or pleural, Body fluid cell count with differential, Protein, total, Glucose, Glucose, Protein, total, Body fluid cell count with differential, Albumin, fluid - peritoneal or pleural, Lactate dehydrogenase, fluid - peritoneal or pleural, Cholesterol, Pleural Fluid, Creatinine, fluid - peritoneal or pleural, Body Fluid Culture, pH, body fluid, pH, body fluid, Cytology - non gyn, Cytology - non gyn, CANCELED: COMPLETE METABOLIC PANEL WITH GFR, CANCELED: Pleural Fluid Cytology, CANCELED: Glucose, CANCELED: Protein, total, CANCELED: Pleural Fluid Cytology, CANCELED: Body Fluid Culture  Shortness of breath - Plan: DG Chest 2 View, Comprehensive metabolic panel, Comprehensive metabolic panel, DG Chest 1 View  Renal mass - Plan: Comprehensive metabolic panel, Comprehensive metabolic panel  Neoplasm of right kidney - Plan: US BIOPSY (LIVER)  Centrilobular emphysema (HCC)  Discussion: Jackelin Correia is a 69 year old woman, former smoker with history of GERD, seasonal allergies and Meniere's Disease who is referred to pulmonary clinic for evaluation of pleural effusion and shortness of breath.   The pleural effusion is likely the leading cause of her dyspnea and cough. The etiology of the effusion is unknown but most concerning for a malignant effusion given her recent findings of right renal mass and exophytic liver masses.   We will plan for thoracentesis today in clinic. Check PT/INR and  CBC to check platelets. We will also check LDH and total protein levels. Pleural fluid will be sent for cell count, cultures, cytology, glucose, LDH, total protein, cholesterol, albumin and creatinine.  Will determine follow up based on pleural fluid studies.  She also has upper lobe predominant centriblobular emphysema based on recent CT chest scan which could also be contributing to her dyspnea. We will consider inhaler therapy in the future.   >60 minutes was spent on this visit including chart review, procedure scheduling, and documentation.   Freda Jackson, MD [facility] Office: [phone]   See Amion for Pager Details     Current Outpatient Medications:  .  amitriptyline (ELAVIL) 25 MG tablet, Take 25 mg by mouth daily., Disp: , Rfl:  .  atorvastatin (LIPITOR) 20 MG tablet, Take 20 mg by mouth at bedtime., Disp: , Rfl:  .  Cholecalciferol (VITAMIN D) 50 MCG (2000 UT) CAPS, Take 2,000 Units by mouth daily., Disp: , Rfl:  .  citalopram (CELEXA) 20 MG tablet, Take 20 mg by mouth daily., Disp: , Rfl:  .  docusate sodium (COLACE) 100 MG capsule, Take 100 mg by mouth daily., Disp: , Rfl:  .  estrogens, conjugated, (PREMARIN) 0.3 MG tablet, Take 0.3 mg by mouth every other day., Disp: , Rfl:  .  famotidine (PEPCID) 20 MG tablet, Take 20 mg by mouth daily., Disp: , Rfl:  .  gabapentin (NEURONTIN) 100 MG capsule, Take 200 mg by mouth at bedtime., Disp: , Rfl:  .  Inulin (FIBER CHOICE PO), Take 1 tablet by mouth 3 times/day as needed-between meals & bedtime., Disp: , Rfl:  .  naproxen sodium (ALEVE) 220 MG  tablet, Take 220 mg by mouth 2 (two) times daily with a meal., Disp: , Rfl:  .  vitamin B-12 (CYANOCOBALAMIN) 1000 MCG tablet, Take 1,000 mcg by mouth daily., Disp: , Rfl:

## 2020-11-29 LAB — LACTATE DEHYDROGENASE: LDH: 180 U/L (ref 120–250)

## 2020-11-30 ENCOUNTER — Telehealth: Payer: Self-pay | Admitting: Pulmonary Disease

## 2020-11-30 DIAGNOSIS — C801 Malignant (primary) neoplasm, unspecified: Secondary | ICD-10-CM

## 2020-11-30 LAB — CYTOLOGY - NON PAP

## 2020-11-30 NOTE — Telephone Encounter (Signed)
I have sent this order to [facility]

## 2020-11-30 NOTE — Telephone Encounter (Signed)
11/30/2020  I have placed a referral to oncology of the recommendations of Dr. Erin Fulling.  Placing comments section [place] for location.  Will route to [facility] for follow-up.  Will route to Dr. Katrine Coho.  Wyn Quaker FNP

## 2020-11-30 NOTE — Telephone Encounter (Signed)
Please place urgent referral to Oncology, preferably to the clinic in [place] as the pleural fluid from 11/28/20 has resulted with adenocarcinoma with final results pending from pathology based on specific staining. I am hoping the final results are in today or tomorrow. Ideal appointment would be for tomorrow or Monday.   I have called the patient and let her know the results so she is expecting calls with appointment times etc.  Thanks, Freda Jackson, MD [place] Office: [phone]   See Amion for Pager Details

## 2020-12-01 LAB — PH, BODY FLUID: pH, Fluid: 7.4

## 2020-12-01 NOTE — Telephone Encounter (Signed)
Please call the [facility] to confirm appointment date/time. I do not see an appointment scheduled in epic for her yet.  Thanks, Wille Glaser

## 2020-12-02 LAB — CHOLESTEROL, PLEURAL FLUID: Cholesterol, Pleural Fluid: 94 mg/dL

## 2020-12-04 LAB — BODY FLUID CULTURE

## 2020-12-07 ENCOUNTER — Inpatient Hospital Stay: Payer: Medicare PPO | Attending: Oncology

## 2020-12-07 ENCOUNTER — Other Ambulatory Visit: Payer: Self-pay | Admitting: Oncology

## 2020-12-07 ENCOUNTER — Other Ambulatory Visit: Payer: Self-pay | Admitting: Hematology and Oncology

## 2020-12-07 ENCOUNTER — Encounter: Payer: Self-pay | Admitting: Oncology

## 2020-12-07 ENCOUNTER — Other Ambulatory Visit: Payer: Self-pay

## 2020-12-07 ENCOUNTER — Inpatient Hospital Stay (INDEPENDENT_AMBULATORY_CARE_PROVIDER_SITE_OTHER): Payer: Medicare PPO | Admitting: Oncology

## 2020-12-07 VITALS — BP 173/74 | HR 75 | Temp 97.9°F | Resp 16 | Ht 64.0 in | Wt 153.5 lb

## 2020-12-07 DIAGNOSIS — D649 Anemia, unspecified: Secondary | ICD-10-CM | POA: Diagnosis not present

## 2020-12-07 DIAGNOSIS — E538 Deficiency of other specified B group vitamins: Secondary | ICD-10-CM | POA: Diagnosis not present

## 2020-12-07 DIAGNOSIS — E559 Vitamin D deficiency, unspecified: Secondary | ICD-10-CM

## 2020-12-07 DIAGNOSIS — D3001 Benign neoplasm of right kidney: Secondary | ICD-10-CM

## 2020-12-07 DIAGNOSIS — J910 Malignant pleural effusion: Secondary | ICD-10-CM | POA: Diagnosis not present

## 2020-12-07 DIAGNOSIS — Z0001 Encounter for general adult medical examination with abnormal findings: Secondary | ICD-10-CM | POA: Diagnosis not present

## 2020-12-07 DIAGNOSIS — C3410 Malignant neoplasm of upper lobe, unspecified bronchus or lung: Secondary | ICD-10-CM

## 2020-12-07 DIAGNOSIS — C641 Malignant neoplasm of right kidney, except renal pelvis: Secondary | ICD-10-CM

## 2020-12-07 DIAGNOSIS — F17210 Nicotine dependence, cigarettes, uncomplicated: Secondary | ICD-10-CM | POA: Diagnosis not present

## 2020-12-07 DIAGNOSIS — C801 Malignant (primary) neoplasm, unspecified: Secondary | ICD-10-CM | POA: Diagnosis not present

## 2020-12-07 LAB — HEPATIC FUNCTION PANEL
ALT: 27 (ref 7–35)
AST: 32 (ref 13–35)
Alkaline Phosphatase: 76 (ref 25–125)
Bilirubin, Total: 0.4

## 2020-12-07 LAB — CBC
MCV: 97 (ref 81–99)
RBC: 4.22 (ref 3.87–5.11)

## 2020-12-07 LAB — BASIC METABOLIC PANEL
BUN: 17 (ref 4–21)
CO2: 25 — AB (ref 13–22)
Chloride: 106 (ref 99–108)
Creatinine: 0.9 (ref 0.5–1.1)
Glucose: 109
Potassium: 3.7 (ref 3.4–5.3)
Sodium: 139 (ref 137–147)

## 2020-12-07 LAB — CBC AND DIFFERENTIAL
HCT: 41 (ref 36–46)
Hemoglobin: 13.7 (ref 12.0–16.0)
Neutrophils Absolute: 4.74
Platelets: 196 (ref 150–399)
WBC: 7.9

## 2020-12-07 LAB — COMPREHENSIVE METABOLIC PANEL
Albumin: 4.2 (ref 3.5–5.0)
Calcium: 8.9 (ref 8.7–10.7)

## 2020-12-07 NOTE — Progress Notes (Signed)
[facility]  [address] [place],  [place]  [address] [phone]  Clinic Day:  12/10/2020  Referring physician: Lauraine Rinne, NP   HISTORY OF PRESENT ILLNESS:  The patient is a 69 y.o. female  who I was asked to consult upon for newly diagnosed adenocarcinoma.   The patient's history dates back to September 2021 when she began having significant coughing for 2 consecutive days.  She initially was tested for COVID, which came back negative.  However, as her coughing persisted, a chest x-ray was ordered.  This study showed a right-sided pleural effusion.  Based upon this finding, a chest CT was then done, which also showed a prominent right-sided pleural effusion.  As concerning abdominal findings were seen per her chest CT, an abdominal MRI was done, which showed a lesion in her right kidney. Ultimately, the patient underwent a thoracentesis in early December 2021, which revealed malignant cells consistent with adenocarcinoma.  Immunohistochemistry of her malignant cells came back positive for CA-9 and CK-7.  These findings raised the suspicion of an underlying renal cell carcinoma being present.  The patient comes in today to go over her thoracentesis results and their implications.  The patient denies having any weight loss over these past few months.  She also denies having any changes in her urinary habits, which includes the possibility of hematuria.  PAST MEDICAL HISTORY:   Past Medical History:  Diagnosis Date  . Arthritis   . Bilateral pneumothoraces   . Carcinoma, basal cell, skin   . Constipation   . Hyperlipidemia   . Squamous cell skin cancer   . Vitamin B12 deficiency   . Vitamin D deficiency    Hypercholesterolemia, vertigo, TMJ complications, endometriosis  PAST SURGICAL HISTORY:   Past Surgical History:  Procedure Laterality Date  . ABDOMINAL HYSTERECTOMY    . APPENDECTOMY    . KNEE SURGERY Right    TOTAL REPLACEMENT  .  RECONSTRUCTION OF EYELID Bilateral    LOWER LID REPAIR POST CANCER EXCISION  . TUBAL LIGATION    . WISDOM TOOTH EXTRACTION     Right partial lung surgery, right knee surgery, appendectomy  CURRENT MEDICATIONS:   Current Outpatient Medications  Medication Sig Dispense Refill  . amitriptyline (ELAVIL) 25 MG tablet Take 25 mg by mouth daily.    Marland Kitchen atorvastatin (LIPITOR) 20 MG tablet Take 20 mg by mouth at bedtime.    . Cholecalciferol (VITAMIN D) 50 MCG (2000 UT) CAPS Take 2,000 Units by mouth daily.    . citalopram (CELEXA) 20 MG tablet Take 20 mg by mouth daily.    Marland Kitchen docusate sodium (COLACE) 100 MG capsule Take 100 mg by mouth daily.    Marland Kitchen estrogens, conjugated, (PREMARIN) 0.3 MG tablet Take 0.3 mg by mouth every other day.    . famotidine (PEPCID) 20 MG tablet Take 20 mg by mouth daily.    Marland Kitchen gabapentin (NEURONTIN) 100 MG capsule Take 200 mg by mouth at bedtime.    . Inulin (FIBER CHOICE PO) Take 1 tablet by mouth 3 times/day as needed-between meals & bedtime.    . naproxen sodium (ALEVE) 220 MG tablet Take 220 mg by mouth 2 (two) times daily with a meal.    . vitamin B-12 (CYANOCOBALAMIN) 1000 MCG tablet Take 1,000 mcg by mouth daily.     No current facility-administered medications for this visit.    ALLERGIES:   Allergies  Allergen Reactions  . Gentamicin     Eyes burning    .  Codeine Itching    Itching inside of the body, and "crazy"  . Demerol Hcl [Meperidine] Itching  . Fml [Fluorometholone]     Eyes burning   . Morphine And Related Itching    Itching inside the body, and "crazy"  . Neomycin-Polymyxin-Gramicidin     Eyes burning   . Prednisone Itching  . Sulfa Antibiotics Itching  . Tobramycin     Eyes burning   . Tramadol Hcl Itching    Itching inside the body    FAMILY HISTORY:   Family History  Problem Relation Age of Onset  . Pancreatic cancer Mother   . Breast cancer Mother   . Melanoma Mother   . Diabetes Father   . Heart attack Father    Her  mother also had ocular melanoma and breast cancer.  Both grandmothers died from unknown cancers.  SOCIAL HISTORY:  The patient was born and raised in [place].  She lives in town with her husband of nearly 78 years.  They have no children.  She was a history teacher for 30 years.  She has smoked as much as a pack and half cigarettes daily for 32 years.  She drinks a glass of wine at night.  REVIEW OF SYSTEMS:  Review of Systems  Constitutional: Positive for fatigue. Negative for fever.  HENT:   Positive for hearing loss. Negative for sore throat.   Eyes: Negative for eye problems.  Respiratory: Positive for cough and shortness of breath. Negative for chest tightness and hemoptysis.   Cardiovascular: Negative for chest pain and palpitations.  Gastrointestinal: Positive for constipation. Negative for abdominal distention, abdominal pain, blood in stool, diarrhea, nausea and vomiting.  Endocrine: Negative for hot flashes.  Genitourinary: Positive for bladder incontinence. Negative for difficulty urinating, dysuria, frequency, hematuria and nocturia.   Musculoskeletal: Positive for arthralgias. Negative for back pain, gait problem and myalgias.  Skin: Negative.  Negative for itching and rash.  Neurological: Negative.  Negative for dizziness, extremity weakness, gait problem, headaches, light-headedness and numbness.  Hematological: Negative.   Psychiatric/Behavioral: Negative for depression and suicidal ideas. The patient is nervous/anxious.      PHYSICAL EXAM:  Blood pressure (!) 173/74, pulse 75, temperature 97.9 F (36.6 C), resp. rate 16, height 5\' 4"  (1.626 m), weight 153 lb 8 oz (69.6 kg), SpO2 95 %. Wt Readings from Last 3 Encounters:  12/07/20 153 lb 8 oz (69.6 kg)  11/28/20 153 lb 9.6 oz (69.7 kg)   Body mass index is 26.35 kg/m. Performance status (ECOG): 0 Physical Exam Constitutional:      Appearance: Normal appearance. She is not ill-appearing.  HENT:      Mouth/Throat:     Mouth: Mucous membranes are moist.     Pharynx: Oropharynx is clear. No oropharyngeal exudate or posterior oropharyngeal erythema.  Cardiovascular:     Rate and Rhythm: Normal rate and regular rhythm.     Heart sounds: No murmur heard. No friction rub. No gallop.   Pulmonary:     Effort: Pulmonary effort is normal. No respiratory distress.     Breath sounds: Normal breath sounds. No wheezing, rhonchi or rales.  Chest:  Breasts:     Right: No axillary adenopathy or supraclavicular adenopathy.     Left: No axillary adenopathy or supraclavicular adenopathy.    Abdominal:     General: Bowel sounds are normal. There is no distension.     Palpations: Abdomen is soft. There is no mass.     Tenderness: There is  no abdominal tenderness.  Musculoskeletal:        General: No swelling.     Right lower leg: No edema.     Left lower leg: No edema.  Lymphadenopathy:     Cervical: No cervical adenopathy.     Upper Body:     Right upper body: No supraclavicular or axillary adenopathy.     Left upper body: No supraclavicular or axillary adenopathy.     Lower Body: No right inguinal adenopathy. No left inguinal adenopathy.  Skin:    General: Skin is warm.     Coloration: Skin is not jaundiced.     Findings: No lesion or rash.  Neurological:     General: No focal deficit present.     Mental Status: She is alert and oriented to person, place, and time. Mental status is at baseline.     Cranial Nerves: Cranial nerves are intact.  Psychiatric:        Mood and Affect: Mood normal.        Behavior: Behavior normal.        Thought Content: Thought content normal.    LABS:   CBC Latest Ref Rng & Units 12/07/2020 11/28/2020  WBC - 7.9 8.0  Hemoglobin 12.0 - 16.0 13.7 14.0  Hematocrit 36 - 46 41 41.8  Platelets 150 - 399 196 202.0   CMP Latest Ref Rng & Units 12/07/2020 11/28/2020  Glucose 70 - 99 mg/dL - 98  BUN 4 - 21 17 14   Creatinine 0.5 - 1.1 0.9 0.97  Sodium 137 - 147  139 138  Potassium 3.4 - 5.3 3.7 3.9  Chloride 99 - 108 106 104  CO2 13 - 22 25(A) 26  Calcium 8.7 - 10.7 8.9 8.8  Total Protein 6.0 - 8.3 g/dL - 6.9  Total Bilirubin 0.2 - 1.2 mg/dL - 0.3  Alkaline Phos 25 - 125 76 66  AST 13 - 35 32 22  ALT 7 - 35 27 21   STUDIES:  DG Chest 1 View  Result Date: 11/28/2020 CLINICAL DATA:  Post thoracentesis EXAM: CHEST  1 VIEW COMPARISON:  Single view 1304 hours compared to 0902 hours FINDINGS: Decrease in RIGHT pleural effusion and basilar atelectasis post thoracentesis. No pneumothorax. Stable heart size and mediastinal contours. Bronchitic changes with RIGHT mid lung scarring. Postsurgical changes at both lung apices. No acute infiltrate. Bones demineralized with degenerative disc disease changes thoracic spine. IMPRESSION: Decreased RIGHT pleural effusion and basilar atelectasis post thoracentesis. No pneumothorax. Persistent bronchitic and postsurgical changes. Electronically Signed   By: Lavonia Dana M.D.   On: 11/28/2020 13:27   DG Chest 2 View  Result Date: 11/28/2020 CLINICAL DATA:  Shortness of breath. EXAM: CHEST - 2 VIEW COMPARISON:  11/04/2017 chest radiographs and prior.  09/21/2020 CT. FINDINGS: Unchanged peribronchial cuffing. Biapical postsurgical sequela, unchanged. Right basilar opacities and small right pleural effusion, unchanged. No pneumothorax. No new focal consolidation. Stable cardiomediastinal silhouette. Multilevel spondylosis. IMPRESSION: Decreased right basilar opacities and pleural effusion. No pneumothorax. Stable postsurgical sequela and bronchitic changes. Electronically Signed   By: Primitivo Gauze M.D.   On: 11/28/2020 16:40     ASSESSMENT & PLAN:  A 69 y.o. female who I was asked to consult upon for metastatic adenocarcinoma.    In clinic today, I went over her CT scans and pleural fluid pathology with her, for which she understands she is likely dealing with metastatic disease.  For completeness, I will get a PET scan  to ensure she  does not have other occult areas of disease metastasis.  As her pleural fluid did stain positive for CA 9, this is highly suspicious for her having renal cell carcinoma. Ultimately, I would like to get more tissue to definitively prove this diagnosis.  This patient may need a kidney biopsy or biopsy from another site which can definitively give a tissue diagnosis of her type of metastatic disease.  I will see this patient back in 1 week to go over her PET scan images and their implications.  Although somewhat despondent with everything discussed, the patient understands all the plans discussed today and is in agreement with them.  I do appreciate Lauraine Rinne, NP for his new consult.   Kodie Pick Macarthur Critchley, MD

## 2020-12-11 ENCOUNTER — Telehealth: Payer: Self-pay | Admitting: Oncology

## 2020-12-11 NOTE — Telephone Encounter (Signed)
Per 12/16 sched note -pet scan sched on 12/25/20.DV sched on 1/4.All appts given to patient

## 2020-12-19 ENCOUNTER — Encounter: Payer: Self-pay | Admitting: Hematology and Oncology

## 2020-12-20 DIAGNOSIS — H903 Sensorineural hearing loss, bilateral: Secondary | ICD-10-CM | POA: Diagnosis not present

## 2020-12-25 DIAGNOSIS — I2510 Atherosclerotic heart disease of native coronary artery without angina pectoris: Secondary | ICD-10-CM | POA: Diagnosis not present

## 2020-12-25 DIAGNOSIS — N2889 Other specified disorders of kidney and ureter: Secondary | ICD-10-CM | POA: Diagnosis not present

## 2020-12-25 DIAGNOSIS — C3410 Malignant neoplasm of upper lobe, unspecified bronchus or lung: Secondary | ICD-10-CM | POA: Diagnosis not present

## 2020-12-25 DIAGNOSIS — J90 Pleural effusion, not elsewhere classified: Secondary | ICD-10-CM | POA: Diagnosis not present

## 2020-12-25 DIAGNOSIS — J432 Centrilobular emphysema: Secondary | ICD-10-CM | POA: Diagnosis not present

## 2020-12-25 DIAGNOSIS — D3001 Benign neoplasm of right kidney: Secondary | ICD-10-CM | POA: Diagnosis not present

## 2020-12-25 DIAGNOSIS — R933 Abnormal findings on diagnostic imaging of other parts of digestive tract: Secondary | ICD-10-CM | POA: Diagnosis not present

## 2020-12-25 DIAGNOSIS — I700 Atherosclerosis of aorta: Secondary | ICD-10-CM | POA: Diagnosis not present

## 2020-12-25 NOTE — Progress Notes (Signed)
[name]  [address] [place],  [place]  [address] [phone]  Clinic Day:  12/27/2020  Referring physician: Mateo Flow, MD   HISTORY OF PRESENT ILLNESS:  The patient is a 70 y.o. female with newly diagnosed adenocarcinoma.  This was proven per a right-sided thoracentesis in early December 2021, which revealed malignant cells consistent with adenocarcinoma.  Immunohistochemistry of her malignant cells came back positive for CA-9 and CK-7.  The CA-9 positivity raised the suspicion of an underlying renal cell carcinoma being present.  Scans done around the same time showed a right renal mass.  She comes in today to go over her PET scan images.  This study was done to determine the origin of her cancer and if there are other areas of occult disease metastasis.  The patient has been doing okay.  However, she is starting to cough again.  This is what led to a chest x-ray being done initially, which revealed her right-sided pleural effusion.    PHYSICAL EXAM:  Blood pressure (!) 176/82, pulse 69, temperature 98.1 F (36.7 C), resp. rate 16, height 5\' 4"  (1.626 m), weight 155 lb 4.8 oz (70.4 kg), SpO2 95 %. Wt Readings from Last 3 Encounters:  12/26/20 155 lb 4.8 oz (70.4 kg)  12/07/20 153 lb 8 oz (69.6 kg)  11/28/20 153 lb 9.6 oz (69.7 kg)   Body mass index is 26.66 kg/m. Performance status (ECOG): 0 Physical Exam Constitutional:      Appearance: Normal appearance. She is not ill-appearing.  HENT:     Mouth/Throat:     Mouth: Mucous membranes are moist.     Pharynx: Oropharynx is clear. No oropharyngeal exudate or posterior oropharyngeal erythema.  Cardiovascular:     Rate and Rhythm: Normal rate and regular rhythm.     Heart sounds: No murmur heard. No friction rub. No gallop.   Pulmonary:     Effort: Pulmonary effort is normal. No respiratory distress.     Breath sounds: Normal breath sounds. No wheezing, rhonchi or rales.  Chest:   Breasts:     Right: No axillary adenopathy or supraclavicular adenopathy.     Left: No axillary adenopathy or supraclavicular adenopathy.    Abdominal:     General: Bowel sounds are normal. There is no distension.     Palpations: Abdomen is soft. There is no mass.     Tenderness: There is no abdominal tenderness.  Musculoskeletal:        General: No swelling.     Right lower leg: No edema.     Left lower leg: No edema.  Lymphadenopathy:     Cervical: No cervical adenopathy.     Upper Body:     Right upper body: No supraclavicular or axillary adenopathy.     Left upper body: No supraclavicular or axillary adenopathy.     Lower Body: No right inguinal adenopathy. No left inguinal adenopathy.  Skin:    General: Skin is warm.     Coloration: Skin is not jaundiced.     Findings: No lesion or rash.  Neurological:     General: No focal deficit present.     Mental Status: She is alert and oriented to person, place, and time. Mental status is at baseline.     Cranial Nerves: Cranial nerves are intact.  Psychiatric:        Mood and Affect: Mood normal.        Behavior: Behavior normal.  Thought Content: Thought content normal.    LABS:   CBC Latest Ref Rng & Units 12/07/2020 11/28/2020  WBC - 7.9 8.0  Hemoglobin 12.0 - 16.0 13.7 14.0  Hematocrit 36 - 46 41 41.8  Platelets 150 - 399 196 202.0   CMP Latest Ref Rng & Units 12/07/2020 11/28/2020  Glucose 70 - 99 mg/dL - 98  BUN 4 - 21 17 14   Creatinine 0.5 - 1.1 0.9 0.97  Sodium 137 - 147 139 138  Potassium 3.4 - 5.3 3.7 3.9  Chloride 99 - 108 106 104  CO2 13 - 22 25(A) 26  Calcium 8.7 - 10.7 8.9 8.8  Total Protein 6.0 - 8.3 g/dL - 6.9  Total Bilirubin 0.2 - 1.2 mg/dL - 0.3  Alkaline Phos 25 - 125 76 66  AST 13 - 35 32 22  ALT 7 - 35 27 21   STUDIES:  Her PET scan revealed the following: FINDINGS: Mediastinal blood pool activity: SUV max 2.4  Liver activity: SUV max NA  NECK: No areas of abnormal  hypermetabolism.  Incidental CT findings: No cervical adenopathy.  CHEST: Areas of left-sided pleural hypermetabolism corresponding to soft tissue thickening most conspicuous is at the anterior left apex, adjacent to hyperattenuation (calcification versus surgical sutures). 2.6 x 1.2 cm and a S.U.V. max of 5.7 on 62/3. This is similar CT appearance to 09/21/2020. Other areas of more mild hypermetabolism and left-sided pleural thickening, including posteriorly on 87/3 ( a S.U.V. max of 2.9). Similar in CT appearance on 09/21/2020.  No pulmonary parenchymal hypermetabolism.  Incidental CT findings: Aortic and lad coronary artery calcification. Similar moderate right pleural effusion. Right apical surgical sutures. Centrilobular emphysema.  ABDOMEN/PELVIS: No hypermetabolism within segment 7 of the liver to correspond to the hyperenhancing lesion on prior MRI. No hypermetabolism to correspond to the presumed lobular hepatic contour within the adjacent hepatic dome as detailed on MRI.  The partially exophytic inter/lower pole right renal mass measures 3.4 cm and a S.U.V. max of 4.8 on 178/3. Grossly similar to on the prior MRI.  No abdominopelvic nodal hypermetabolism.  Incidental CT findings: Abdominal aortic atherosclerosis. No abdominopelvic adenopathy. Colonic stool burden suggests constipation. Mild pelvic floor laxity.  SKELETON: Left anterior fifth and sixth rib hypermetabolism corresponding to nonacute fractures. No suspicious osseous hypermetabolism.  Incidental CT findings: Grade 1-2 L4-5 anterolisthesis. Presumed bone islands within the pelvis.  IMPRESSION: 1. Hypermetabolic right renal mass, consistent with renal cell carcinoma. 2. No hypermetabolism to correspond to the hyperenhancing lesion within segment 7 of the liver. This remains technically indeterminate and can be re-evaluated at follow-up imaging. 3. Extensive left-sided pleural hypermetabolism and soft  tissue thickening. Given clinical history and morphology, favored to be related to talc pleurodesis. Correlate with prior surgical history. If no such history, pleural metastasis would be a concern. 4. Similar moderate right pleural effusion. 5. Incidental findings, including: Coronary artery atherosclerosis. Aortic Atherosclerosis (ICD10-I70.0). Possible constipation. Pelvic floor laxity.  ASSESSMENT & PLAN:  A 70 y.o. female who I was asked to consult upon for metastatic adenocarcinoma.    In clinic today, I went over her PET scan images with her, for which she could see her right renal lesion, the disease in her left hemithorax, and her recurrent right effusion.  All of these findings are worrisome for metastatic renal cell cancer.  I did speak with her urologist, who is in agreement with her no longer being considered for a cytoreductive nephrectomy, particularly as she has no problems with hematuria  or other genitourinary issues.  I will arrange for her to undergo an ultrasound-guided right renal biopsy to prove her diagnosis.  I will see her back in 2 weeks to undergo her biopsy results, which will be used to formulate her treatment plan.  The patient understands all the plans discussed today and is in agreement with them.  Washington Whedbee Macarthur Critchley, MD

## 2020-12-26 ENCOUNTER — Other Ambulatory Visit: Payer: Self-pay

## 2020-12-26 ENCOUNTER — Inpatient Hospital Stay: Payer: Medicare PPO | Attending: Oncology | Admitting: Oncology

## 2020-12-26 VITALS — BP 176/82 | HR 69 | Temp 98.1°F | Resp 16 | Ht 64.0 in | Wt 155.3 lb

## 2020-12-26 DIAGNOSIS — C641 Malignant neoplasm of right kidney, except renal pelvis: Secondary | ICD-10-CM

## 2020-12-27 ENCOUNTER — Encounter: Payer: Self-pay | Admitting: Oncology

## 2020-12-28 ENCOUNTER — Telehealth: Payer: Self-pay | Admitting: Oncology

## 2020-12-28 DIAGNOSIS — Z03818 Encounter for observation for suspected exposure to other biological agents ruled out: Secondary | ICD-10-CM | POA: Diagnosis not present

## 2020-12-28 NOTE — Telephone Encounter (Signed)
Gave Tammy message that patient called regarding her Biopsy that is to be scheduled

## 2020-12-29 ENCOUNTER — Ambulatory Visit: Admit: 2020-12-29 | Payer: Medicare PPO | Admitting: Urology

## 2020-12-29 SURGERY — NEPHRECTOMY, PARTIAL, ROBOT-ASSISTED
Anesthesia: General | Laterality: Right

## 2021-01-01 ENCOUNTER — Other Ambulatory Visit: Payer: Self-pay | Admitting: Hematology and Oncology

## 2021-01-01 DIAGNOSIS — C649 Malignant neoplasm of unspecified kidney, except renal pelvis: Secondary | ICD-10-CM | POA: Diagnosis not present

## 2021-01-01 DIAGNOSIS — C641 Malignant neoplasm of right kidney, except renal pelvis: Secondary | ICD-10-CM | POA: Diagnosis not present

## 2021-01-01 DIAGNOSIS — N2889 Other specified disorders of kidney and ureter: Secondary | ICD-10-CM | POA: Diagnosis not present

## 2021-01-01 DIAGNOSIS — Z01812 Encounter for preprocedural laboratory examination: Secondary | ICD-10-CM | POA: Diagnosis not present

## 2021-01-01 LAB — BASIC METABOLIC PANEL
BUN: 12 (ref 4–21)
CO2: 24 — AB (ref 13–22)
Chloride: 108 (ref 99–108)
Creatinine: 0.8 (ref 0.5–1.1)
Glucose: 132
Potassium: 3.9 (ref 3.4–5.3)
Sodium: 141 (ref 137–147)

## 2021-01-01 LAB — COMPREHENSIVE METABOLIC PANEL
Albumin: 4.6 (ref 3.5–5.0)
Calcium: 9.5 (ref 8.7–10.7)

## 2021-01-01 LAB — CBC AND DIFFERENTIAL
HCT: 33 — AB (ref 36–46)
Hemoglobin: 15.2 (ref 12.0–16.0)
Neutrophils Absolute: 3.9
Platelets: 192 (ref 150–399)
WBC: 6.4

## 2021-01-01 LAB — PROTIME-INR: Protime: 10.3 (ref 10.0–13.8)

## 2021-01-01 LAB — HEPATIC FUNCTION PANEL
ALT: 28 (ref 7–35)
AST: 30 (ref 13–35)
Alkaline Phosphatase: 71 (ref 25–125)
Bilirubin, Total: 0.6

## 2021-01-01 LAB — CBC
MCV: 98 (ref 81–99)
RBC: 4.62 (ref 3.87–5.11)

## 2021-01-04 ENCOUNTER — Telehealth: Payer: Self-pay | Admitting: Hematology and Oncology

## 2021-01-04 NOTE — Telephone Encounter (Signed)
Patient scheduled for 1/19 at 1:30 pm

## 2021-01-05 ENCOUNTER — Other Ambulatory Visit: Payer: Self-pay | Admitting: Hematology and Oncology

## 2021-01-05 DIAGNOSIS — C641 Malignant neoplasm of right kidney, except renal pelvis: Secondary | ICD-10-CM

## 2021-01-05 DIAGNOSIS — D3001 Benign neoplasm of right kidney: Secondary | ICD-10-CM | POA: Insufficient documentation

## 2021-01-10 ENCOUNTER — Telehealth: Payer: Self-pay

## 2021-01-10 ENCOUNTER — Inpatient Hospital Stay: Payer: Medicare PPO | Attending: Hematology and Oncology | Admitting: Hematology and Oncology

## 2021-01-10 ENCOUNTER — Other Ambulatory Visit: Payer: Self-pay

## 2021-01-10 DIAGNOSIS — C641 Malignant neoplasm of right kidney, except renal pelvis: Secondary | ICD-10-CM

## 2021-01-10 NOTE — Progress Notes (Deleted)
[facility]  [address] [place],  [place]  [address] [phone]  Clinic Day:  01/10/2021  Referring physician: Mateo Flow, MD   CHIEF COMPLAINT:  CC: ***  Current Treatment:  ***   HISTORY OF PRESENT ILLNESS:  [name] [name] is a 70 y.o. female with a history of ***    INTERVAL HISTORY:  [name] [name] is here today for routine follow up of her ***. She denies fevers or chills. She  denies pain. Her appetite is good. Her weight {Weight change:10426}.  REVIEW OF SYSTEMS:  Review of Systems - Oncology   VITALS:  There were no vitals taken for this visit.  Wt Readings from Last 3 Encounters:  12/26/20 155 lb 4.8 oz (70.4 kg)  12/07/20 153 lb 8 oz (69.6 kg)  11/28/20 153 lb 9.6 oz (69.7 kg)    There is no height or weight on file to calculate BMI.  Performance status (ECOG): {CHL ONC [id number]  PHYSICAL EXAM:  Physical Exam Lymph nodes:   There is no cervical, clavicular, axillary or inguinal lymphadenopathy.   LABS:   CBC Latest Ref Rng & Units 01/01/2021 12/07/2020 11/28/2020  WBC - 6.4 7.9 8.0  Hemoglobin 12.0 - 16.0 15.2 13.7 14.0  Hematocrit 36 - 46 33(A) 41 41.8  Platelets 150 - 399 192 196 202.0   CMP Latest Ref Rng & Units 01/01/2021 12/07/2020 11/28/2020  Glucose 70 - 99 mg/dL - - 98  BUN 4 - 21 12 17 14   Creatinine 0.5 - 1.1 0.8 0.9 0.97  Sodium 137 - 147 141 139 138  Potassium 3.4 - 5.3 3.9 3.7 3.9  Chloride 99 - 108 108 106 104  CO2 13 - 22 24(A) 25(A) 26  Calcium 8.7 - 10.7 9.5 8.9 8.8  Total Protein 6.0 - 8.3 g/dL - - 6.9  Total Bilirubin 0.2 - 1.2 mg/dL - - 0.3  Alkaline Phos 25 - 125 71 76 66  AST 13 - 35 30 32 22  ALT 7 - 35 28 27 21      No results found for: CEA1 / No results found for: CEA1 No results found for: PSA1 No results found for: TDH741 No results found for: CAN125  No results found for: TOTALPROTELP, ALBUMINELP, A1GS, A2GS, BETS, BETA2SER, GAMS, MSPIKE, SPEI No results found for:  TIBC, FERRITIN, IRONPCTSAT Lab Results  Component Value Date   LDH 180 11/28/2020    STUDIES:  No results found.    HISTORY:   Past Medical History:  Diagnosis Date  . Arthritis   . Bilateral pneumothoraces   . Carcinoma, basal cell, skin   . Constipation   . Hyperlipidemia   . Squamous cell skin cancer   . Vitamin B12 deficiency   . Vitamin D deficiency     Past Surgical History:  Procedure Laterality Date  . ABDOMINAL HYSTERECTOMY    . APPENDECTOMY    . KNEE SURGERY Right    TOTAL REPLACEMENT  . RECONSTRUCTION OF EYELID Bilateral    LOWER LID REPAIR POST CANCER EXCISION  . TUBAL LIGATION    . WISDOM TOOTH EXTRACTION      Family History  Problem Relation Age of Onset  . Pancreatic cancer Mother   . Breast cancer Mother   . Melanoma Mother   . Diabetes Father   . Heart attack Father     Social History:  reports that she quit smoking about 20 years ago. Her smoking use included cigarettes. She has a 49.50  pack-year smoking history. She has never used smokeless tobacco. She reports current alcohol use. She reports previous drug use.The patient is {Blank single:19197::"alone","accompanied by"} *** today.  Allergies:  Allergies  Allergen Reactions  . Gentamicin     Eyes burning    . Codeine Itching    Itching inside of the body, and "crazy"  . Demerol Hcl [Meperidine] Itching  . Fml [Fluorometholone]     Eyes burning   . Morphine And Related Itching    Itching inside the body, and "crazy"  . Neomycin-Polymyxin-Gramicidin     Eyes burning   . Prednisone Itching  . Sulfa Antibiotics Itching  . Tobramycin     Eyes burning   . Tramadol Hcl Itching    Itching inside the body    Current Medications: Current Outpatient Medications  Medication Sig Dispense Refill  . amitriptyline (ELAVIL) 25 MG tablet Take 25 mg by mouth daily.    Marland Kitchen atorvastatin (LIPITOR) 20 MG tablet Take 20 mg by mouth at bedtime.    . Cholecalciferol (VITAMIN D) 50 MCG (2000 UT)  CAPS Take 2,000 Units by mouth daily.    . citalopram (CELEXA) 20 MG tablet Take 20 mg by mouth daily.    Marland Kitchen docusate sodium (COLACE) 100 MG capsule Take 100 mg by mouth daily.    Marland Kitchen estrogens, conjugated, (PREMARIN) 0.3 MG tablet Take 0.3 mg by mouth every other day.    . famotidine (PEPCID) 20 MG tablet Take 20 mg by mouth daily.    Marland Kitchen gabapentin (NEURONTIN) 100 MG capsule Take 200 mg by mouth at bedtime.    . Inulin (FIBER CHOICE PO) Take 1 tablet by mouth 3 times/day as needed-between meals & bedtime.    . naproxen sodium (ALEVE) 220 MG tablet Take 220 mg by mouth 2 (two) times daily with a meal.    . vitamin B-12 (CYANOCOBALAMIN) 1000 MCG tablet Take 1,000 mcg by mouth daily.     No current facility-administered medications for this visit.     ASSESSMENT & PLAN:   Assessment:  [name] [name] is a 70 y.o. female ***  Plan: ***  The patient understands the plans discussed today and is in agreement with them.  She knows to contact our office if she develops concerns prior to her next appointment.   I provided *** minutes (8:32 AM - 8:32 AM) of face-to-face time during this this encounter and > 50% was spent counseling as documented under my assessment and plan.    Melodye Ped, NP

## 2021-01-10 NOTE — Progress Notes (Signed)
  [facility]  [address] [place],  [place]  [address] [phone]  Clinic Day:  01/10/2021  Referring physician: Mateo Flow, MD   HISTORY OF PRESENT ILLNESS:  The patient is a 70 y.o. female with newly diagnosed adenocarcinoma.  This was proven per a right-sided thoracentesis in early December 2021, which revealed malignant cells consistent with adenocarcinoma.  Immunohistochemistry of her malignant cells came back positive for CA-9 and CK-7.  The CA-9 positivity raised the suspicion of an underlying renal cell carcinoma being present.  Scans done around the same time showed a right renal mass. PET scan revealed a right renal lesion as well as disease in her left hemithorax and recurrent pleural effusion.This study was done to determine the origin of her cancer and if there are other areas of occult disease metastasis. She then underwent ultrasound guided right renal biopsy which revealed clear cell renal cell carcinoma. She presents to clinic today for follow up on pathology results and to discuss a treatment plan.   She denies fever, chills, nausea or vomiting. She denies issue with bowel or bladder. She has continued shortness of breath and cough without chest pain.   PHYSICAL EXAM:  There were no vitals taken for this visit. Wt Readings from Last 3 Encounters:  12/26/20 155 lb 4.8 oz (70.4 kg)  12/07/20 153 lb 8 oz (69.6 kg)  11/28/20 153 lb 9.6 oz (69.7 kg)   There is no height or weight on file to calculate BMI. Performance status (ECOG): 1 Physical Exam LABS:   CBC Latest Ref Rng & Units 01/01/2021 12/07/2020 11/28/2020  WBC - 6.4 7.9 8.0  Hemoglobin 12.0 - 16.0 15.2 13.7 14.0  Hematocrit 36 - 46 33(A) 41 41.8  Platelets 150 - 399 192 196 202.0   CMP Latest Ref Rng & Units 01/01/2021 12/07/2020 11/28/2020  Glucose 70 - 99 mg/dL - - 98  BUN 4 - 21 12 17 14   Creatinine 0.5 - 1.1 0.8 0.9 0.97  Sodium 137 - 147 141 139 138  Potassium 3.4 -  5.3 3.9 3.7 3.9  Chloride 99 - 108 108 106 104  CO2 13 - 22 24(A) 25(A) 26  Calcium 8.7 - 10.7 9.5 8.9 8.8  Total Protein 6.0 - 8.3 g/dL - - 6.9  Total Bilirubin 0.2 - 1.2 mg/dL - - 0.3  Alkaline Phos 25 - 125 71 76 66  AST 13 - 35 30 32 22  ALT 7 - 35 28 27 21         ASSESSMENT & PLAN:  A 70 y.o. female with recently diagnosed clear cell renal cell carcinoma. Today we went over her biopsy results as well as the treatment plan which will include nivolumab and ipilumumab IV every 4 weeks. We will arrange for mediport placement and obtain authorization for treatment plan. Once these steps have been completed, we will schedule the patient for treatment. Both her and her husband verbalized understanding of and agreement to the plans discussed today.     Melodye Ped, NP

## 2021-01-10 NOTE — Telephone Encounter (Signed)
Appointment with Dr. Noberto Retort for port placement on Tuesday, January 23, 2021 at 0945am arrival time for a 10:00am appointment time. Patient called and notified.

## 2021-01-10 NOTE — Progress Notes (Signed)
The patient is a with newly diagnosed.  Patient presents to clinic today for chemotherapy education and palliative care consult.  We will start.  We will send in prescriptions for prochlorperazine and ondansetron.  The patient verbalizes understanding of and agreement to the plan as discussed today.  Provided general information including the following: 1.  Date of education: 01-10-2021 2.  Physician name: Dr. Bobby Rumpf 3.  Diagnosis: Clear cell renal cell carcinoma 4.  Metastatic 5.  Palliative 6.  Chemotherapy plan including drugs and how often: Nivolumab/ Ipilimumab IV every 4 weeks 7.  Start date: Pending mediport placement and authorization 8.  Other referrals: No other referrals at this time 9.  The patient is to call our office with any questions or concerns.  Our office number [phone], if after hours or on the weekend, call the same number and wait for the answering service.  There is always an oncologist on call 10.  Medications prescribed: Zofran and Compazine 11.  The patient has verbalized understanding of the treatment plan and has no barriers to adherence or understanding.  Obtained signed consent from patient.  Discussed symptoms including 1.  Low blood counts including red blood cells, white blood cells and platelets. 2. Infection including to avoid large crowds, wash hands frequently, and stay away from people who were sick.  If fever develops of 100.4 or higher, call our office. 3.  Mucositis-given instructions on mouth rinse (baking soda and salt mixture).  Keep mouth clean.  Use soft bristle toothbrush.  If mouth sores develop, call our clinic. 4.  Nausea/vomiting-gave prescriptions for ondansetron 4 mg every 4 hours as needed for nausea, may take around the clock if persistent.  Compazine 10 mg every 6 hours, may take around the clock if persistent. 5.  Diarrhea-use over-the-counter Imodium.  Call clinic if not controlled. 6.  Constipation-use senna, 1 to 2 tablets twice  a day.  If no BM in 2 to 3 days call the clinic. 7.  Loss of appetite-try to eat small meals every 2-3 hours.  Call clinic if not eating. 8.  Taste changes-zinc 500 mg daily.  If becomes severe call clinic. 9.  Alcoholic beverages. 10.  Drink 2 to 3 quarts of water per day. 11.  Peripheral neuropathy-patient to call if numbness or tingling in hands or feet is persistent   Gave information on the supportive care team and how to contact them regarding services.  Discussed advanced directives.  The patient does not have their advanced directives but will look at the copy provided in their notebook and will call with any questions. [employer] Social worker Advanced directives  Answered questions to patient satisfaction.  Patient is to call with any further questions or concerns.  Time spent on this palliative care/chemotherapy education was 60 minutes with more than 50% spent discussing diagnosis, prognosis and symptom management.

## 2021-01-12 ENCOUNTER — Other Ambulatory Visit: Payer: Self-pay

## 2021-01-12 DIAGNOSIS — R112 Nausea with vomiting, unspecified: Secondary | ICD-10-CM

## 2021-01-12 DIAGNOSIS — T451X5A Adverse effect of antineoplastic and immunosuppressive drugs, initial encounter: Secondary | ICD-10-CM

## 2021-01-12 MED ORDER — ONDANSETRON HCL 4 MG PO TABS: 4.0000 mg | ORAL_TABLET | ORAL | 1 refills | Status: DC | PRN

## 2021-01-12 MED ORDER — PROCHLORPERAZINE MALEATE 10 MG PO TABS: 10.0000 mg | ORAL_TABLET | Freq: Four times a day (QID) | ORAL | 1 refills | Status: DC | PRN

## 2021-01-12 NOTE — Telephone Encounter (Signed)
Sent!

## 2021-01-23 DIAGNOSIS — C641 Malignant neoplasm of right kidney, except renal pelvis: Secondary | ICD-10-CM | POA: Diagnosis not present

## 2021-01-23 DIAGNOSIS — Z1152 Encounter for screening for COVID-19: Secondary | ICD-10-CM | POA: Diagnosis not present

## 2021-01-23 DIAGNOSIS — C782 Secondary malignant neoplasm of pleura: Secondary | ICD-10-CM | POA: Diagnosis not present

## 2021-01-26 DIAGNOSIS — J9811 Atelectasis: Secondary | ICD-10-CM | POA: Diagnosis not present

## 2021-01-26 DIAGNOSIS — Z87891 Personal history of nicotine dependence: Secondary | ICD-10-CM | POA: Diagnosis not present

## 2021-01-26 DIAGNOSIS — C641 Malignant neoplasm of right kidney, except renal pelvis: Secondary | ICD-10-CM | POA: Diagnosis not present

## 2021-01-26 DIAGNOSIS — C782 Secondary malignant neoplasm of pleura: Secondary | ICD-10-CM | POA: Diagnosis not present

## 2021-01-26 DIAGNOSIS — C799 Secondary malignant neoplasm of unspecified site: Secondary | ICD-10-CM | POA: Diagnosis not present

## 2021-01-26 DIAGNOSIS — J90 Pleural effusion, not elsewhere classified: Secondary | ICD-10-CM | POA: Diagnosis not present

## 2021-01-26 DIAGNOSIS — Z79899 Other long term (current) drug therapy: Secondary | ICD-10-CM | POA: Diagnosis not present

## 2021-01-26 DIAGNOSIS — K219 Gastro-esophageal reflux disease without esophagitis: Secondary | ICD-10-CM | POA: Diagnosis not present

## 2021-01-30 ENCOUNTER — Telehealth: Payer: Self-pay

## 2021-01-30 NOTE — Telephone Encounter (Signed)
Dr. Venita Sheffield office called to let us know that the patient had her port placed last Friday and is ready to start treatment. Please call patient with date and time treatment will be started.

## 2021-01-31 ENCOUNTER — Other Ambulatory Visit: Payer: Self-pay | Admitting: Oncology

## 2021-02-01 ENCOUNTER — Other Ambulatory Visit: Payer: Self-pay | Admitting: Oncology

## 2021-02-02 ENCOUNTER — Other Ambulatory Visit: Payer: Self-pay | Admitting: Oncology

## 2021-02-02 ENCOUNTER — Other Ambulatory Visit: Payer: Self-pay

## 2021-02-02 ENCOUNTER — Encounter: Payer: Self-pay | Admitting: Oncology

## 2021-02-02 ENCOUNTER — Inpatient Hospital Stay: Payer: Medicare PPO

## 2021-02-02 ENCOUNTER — Inpatient Hospital Stay (INDEPENDENT_AMBULATORY_CARE_PROVIDER_SITE_OTHER): Payer: Medicare PPO | Admitting: Oncology

## 2021-02-02 ENCOUNTER — Telehealth: Payer: Self-pay | Admitting: Oncology

## 2021-02-02 ENCOUNTER — Other Ambulatory Visit: Payer: Self-pay | Admitting: Hematology and Oncology

## 2021-02-02 ENCOUNTER — Inpatient Hospital Stay: Payer: Medicare PPO | Attending: Oncology

## 2021-02-02 VITALS — BP 144/93 | HR 99 | Temp 97.9°F | Resp 16 | Ht 64.0 in | Wt 152.7 lb

## 2021-02-02 DIAGNOSIS — C641 Malignant neoplasm of right kidney, except renal pelvis: Secondary | ICD-10-CM

## 2021-02-02 DIAGNOSIS — D3001 Benign neoplasm of right kidney: Secondary | ICD-10-CM

## 2021-02-02 DIAGNOSIS — C649 Malignant neoplasm of unspecified kidney, except renal pelvis: Secondary | ICD-10-CM | POA: Diagnosis not present

## 2021-02-02 DIAGNOSIS — Z5112 Encounter for antineoplastic immunotherapy: Secondary | ICD-10-CM | POA: Diagnosis not present

## 2021-02-02 DIAGNOSIS — Z79899 Other long term (current) drug therapy: Secondary | ICD-10-CM | POA: Insufficient documentation

## 2021-02-02 DIAGNOSIS — R12 Heartburn: Secondary | ICD-10-CM

## 2021-02-02 DIAGNOSIS — D649 Anemia, unspecified: Secondary | ICD-10-CM | POA: Diagnosis not present

## 2021-02-02 LAB — BASIC METABOLIC PANEL
BUN: 12 (ref 4–21)
CO2: 23 — AB (ref 13–22)
Chloride: 105 (ref 99–108)
Creatinine: 0.7 (ref 0.5–1.1)
Glucose: 145
Potassium: 4 (ref 3.4–5.3)
Sodium: 137 (ref 137–147)

## 2021-02-02 LAB — TSH: TSH: 2.825 u[IU]/mL (ref 0.350–4.500)

## 2021-02-02 LAB — CBC AND DIFFERENTIAL
HCT: 43 (ref 36–46)
Hemoglobin: 14.3 (ref 12.0–16.0)
Neutrophils Absolute: 3.83
Platelets: 199 (ref 150–399)
WBC: 6.6

## 2021-02-02 LAB — HEPATIC FUNCTION PANEL
ALT: 29 (ref 7–35)
AST: 32 (ref 13–35)
Alkaline Phosphatase: 63 (ref 25–125)
Bilirubin, Total: 0.6

## 2021-02-02 LAB — COMPREHENSIVE METABOLIC PANEL
Albumin: 4.4 (ref 3.5–5.0)
Calcium: 8.2 — AB (ref 8.7–10.7)

## 2021-02-02 LAB — CBC: RBC: 4.37 (ref 3.87–5.11)

## 2021-02-02 NOTE — Telephone Encounter (Signed)
Per 2/11 LOS, patient scheduled for March Appt's.  Gave patient Appt Summary

## 2021-02-03 LAB — T4: T4, Total: 5.9 ug/dL (ref 4.5–12.0)

## 2021-02-05 ENCOUNTER — Other Ambulatory Visit: Payer: Self-pay

## 2021-02-05 ENCOUNTER — Inpatient Hospital Stay: Payer: Medicare PPO

## 2021-02-05 VITALS — BP 187/92 | HR 59 | Temp 97.7°F | Resp 20 | Wt 150.2 lb

## 2021-02-05 DIAGNOSIS — C649 Malignant neoplasm of unspecified kidney, except renal pelvis: Secondary | ICD-10-CM | POA: Diagnosis not present

## 2021-02-05 DIAGNOSIS — D3001 Benign neoplasm of right kidney: Secondary | ICD-10-CM

## 2021-02-05 DIAGNOSIS — Z5112 Encounter for antineoplastic immunotherapy: Secondary | ICD-10-CM | POA: Diagnosis not present

## 2021-02-05 DIAGNOSIS — R12 Heartburn: Secondary | ICD-10-CM

## 2021-02-05 DIAGNOSIS — Z79899 Other long term (current) drug therapy: Secondary | ICD-10-CM | POA: Diagnosis not present

## 2021-02-05 MED ORDER — SODIUM CHLORIDE 0.9 % IV SOLN
1.0000 mg/kg | Freq: Once | INTRAVENOUS | Status: AC
  Administered 2021-02-05: 70 mg via INTRAVENOUS
  Filled 2021-02-05: qty 14

## 2021-02-05 MED ORDER — ALUM & MAG HYDROXIDE-SIMETH 200-200-20 MG/5ML PO SUSP
ORAL | Status: AC
  Filled 2021-02-05: qty 30

## 2021-02-05 MED ORDER — DIPHENHYDRAMINE HCL 50 MG/ML IJ SOLN
INTRAMUSCULAR | Status: AC
  Filled 2021-02-05: qty 1

## 2021-02-05 MED ORDER — DIPHENHYDRAMINE HCL 50 MG/ML IJ SOLN
25.0000 mg | Freq: Once | INTRAMUSCULAR | Status: AC
  Administered 2021-02-05: 25 mg via INTRAVENOUS

## 2021-02-05 MED ORDER — FAMOTIDINE IN NACL 20-0.9 MG/50ML-% IV SOLN
20.0000 mg | Freq: Once | INTRAVENOUS | Status: AC
  Administered 2021-02-05: 20 mg via INTRAVENOUS

## 2021-02-05 MED ORDER — SODIUM CHLORIDE 0.9 % IV SOLN
Freq: Once | INTRAVENOUS | Status: AC
  Filled 2021-02-05: qty 250

## 2021-02-05 MED ORDER — ALUM & MAG HYDROXIDE-SIMETH 200-200-20 MG/5ML PO SUSP
30.0000 mL | Freq: Once | ORAL | Status: AC
  Administered 2021-02-05: 30 mL via ORAL

## 2021-02-05 MED ORDER — FAMOTIDINE IN NACL 20-0.9 MG/50ML-% IV SOLN
INTRAVENOUS | Status: AC
  Filled 2021-02-05: qty 50

## 2021-02-05 MED ORDER — HEPARIN SOD (PORK) LOCK FLUSH 100 UNIT/ML IV SOLN
500.0000 [IU] | Freq: Once | INTRAVENOUS | Status: AC | PRN
  Administered 2021-02-05: 500 [IU]
  Filled 2021-02-05: qty 5

## 2021-02-05 MED ORDER — SODIUM CHLORIDE 0.9 % IV SOLN
2.8400 mg/kg | Freq: Once | INTRAVENOUS | Status: AC
  Administered 2021-02-05: 200 mg via INTRAVENOUS
  Filled 2021-02-05: qty 20

## 2021-02-05 NOTE — Patient Instructions (Signed)
[facility] Discharge Instructions for Patients Receiving Chemotherapy   Today you received the following chemotherapy agents Ipilimumab, Nivolumab  To help prevent nausea and vomiting after your treatment, we encourage you to take your nausea medication.   Nivolumab injection What is this medicine? NIVOLUMAB (nye VOL ue mab) is a monoclonal antibody. It treats certain types of cancer. Some of the cancers treated are colon cancer, head and neck cancer, Hodgkin lymphoma, lung cancer, and melanoma. This medicine may be used for other purposes; ask your health care provider or pharmacist if you have questions. COMMON BRAND NAME(S): Opdivo What should I tell my health care provider before I take this medicine? They need to know if you have any of these conditions:  autoimmune diseases like Crohn's disease, ulcerative colitis, or lupus  have had or planning to have an allogeneic stem cell transplant (uses someone else's stem cells)  history of chest radiation  history of organ transplant  nervous system problems like myasthenia gravis or Guillain-Barre syndrome  an unusual or allergic reaction to nivolumab, other medicines, foods, dyes, or preservatives  pregnant or trying to get pregnant  breast-feeding How should I use this medicine? This medicine is for infusion into a vein. It is given by a health care professional in a hospital or clinic setting. A special MedGuide will be given to you before each treatment. Be sure to read this information carefully each time. Talk to your pediatrician regarding the use of this medicine in children. While this drug may be prescribed for children as young as 12 years for selected conditions, precautions do apply. Overdosage: If you think you have taken too much of this medicine contact a poison control center or emergency room at once. NOTE: This medicine is only for you. Do not share this medicine with others. What if I  miss a dose? It is important not to miss your dose. Call your doctor or health care professional if you are unable to keep an appointment. What may interact with this medicine? Interactions have not been studied. This list may not describe all possible interactions. Give your health care provider a list of all the medicines, herbs, non-prescription drugs, or dietary supplements you use. Also tell them if you smoke, drink alcohol, or use illegal drugs. Some items may interact with your medicine. What should I watch for while using this medicine? This drug may make you feel generally unwell. Continue your course of treatment even though you feel ill unless your doctor tells you to stop. You may need blood work done while you are taking this medicine. Do not become pregnant while taking this medicine or for 5 months after stopping it. Women should inform their doctor if they wish to become pregnant or think they might be pregnant. There is a potential for serious side effects to an unborn child. Talk to your health care professional or pharmacist for more information. Do not breast-feed an infant while taking this medicine or for 5 months after stopping it. What side effects may I notice from receiving this medicine? Side effects that you should report to your doctor or health care professional as soon as possible:  allergic reactions like skin rash, itching or hives, swelling of the face, lips, or tongue  breathing problems  blood in the urine  bloody or watery diarrhea or black, tarry stools  changes in emotions or moods  changes in vision  chest pain  cough  dizziness  feeling faint or lightheaded,  falls  fever, chills  headache with fever, neck stiffness, confusion, loss of memory, sensitivity to light, hallucination, loss of contact with reality, or seizures  joint pain  mouth sores  redness, blistering, peeling or loosening of the skin, including inside the mouth  severe  muscle pain or weakness  signs and symptoms of high blood sugar such as dizziness; dry mouth; dry skin; fruity breath; nausea; stomach pain; increased hunger or thirst; increased urination  signs and symptoms of kidney injury like trouble passing urine or change in the amount of urine  signs and symptoms of liver injury like dark yellow or brown urine; general ill feeling or flu-like symptoms; light-colored stools; loss of appetite; nausea; right upper belly pain; unusually weak or tired; yellowing of the eyes or skin  swelling of the ankles, feet, hands  trouble passing urine or change in the amount of urine  unusually weak or tired  weight gain or loss Side effects that usually do not require medical attention (report to your doctor or health care professional if they continue or are bothersome):  bone pain  constipation  decreased appetite  diarrhea  muscle pain  nausea, vomiting  tiredness This list may not describe all possible side effects. Call your doctor for medical advice about side effects. You may report side effects to FDA at 1-800-FDA-1088. Where should I keep my medicine? This drug is given in a hospital or clinic and will not be stored at home. NOTE: This sheet is a summary. It may not cover all possible information. If you have questions about this medicine, talk to your doctor, pharmacist, or health care provider.  2021 Elsevier/Gold Standard (2020-04-12 10:08:25)   Ipilimumab injection What is this medicine? IPILIMUMAB (IP i LIM ue mab) is a monoclonal antibody. It is used to treat colorectal cancer, kidney cancer, liver cancer, lung cancer, melanoma, and mesothelioma. This medicine may be used for other purposes; ask your health care provider or pharmacist if you have questions. COMMON BRAND NAME(S): YERVOY What should I tell my health care provider before I take this medicine? They need to know if you have any of these conditions:  autoimmune  diseases like Crohn's disease, ulcerative colitis, or lupus  have had or planning to have an allogeneic stem cell transplant (uses someone else's stem cells)  history of organ transplant  nervous system problems like myasthenia gravis or Guillain-Barre syndrome  an unusual or allergic reaction to ipilimumab, other medicines, foods, dyes, or preservatives  pregnant or trying to get pregnant  breast-feeding How should I use this medicine? This medicine is for infusion into a vein. It is given by a health care professional in a hospital or clinic setting. A special MedGuide will be given to you before each treatment. Be sure to read this information carefully each time. Talk to your pediatrician regarding the use of this medicine in children. While this drug may be prescribed for children as young as 12 years for selected conditions, precautions do apply. Overdosage: If you think you have taken too much of this medicine contact a poison control center or emergency room at once. NOTE: This medicine is only for you. Do not share this medicine with others. What if I miss a dose? It is important not to miss your dose. Call your doctor or health care professional if you are unable to keep an appointment. What may interact with this medicine? Interactions are not expected. This list may not describe all possible interactions. Give your health care  provider a list of all the medicines, herbs, non-prescription drugs, or dietary supplements you use. Also tell them if you smoke, drink alcohol, or use illegal drugs. Some items may interact with your medicine. What should I watch for while using this medicine? Tell your doctor or healthcare professional if your symptoms do not start to get better or if they get worse. Do not become pregnant while taking this medicine or for 3 months after stopping it. Women should inform their doctor if they wish to become pregnant or think they might be pregnant. There  is a potential for serious side effects to an unborn child. Talk to your health care professional or pharmacist for more information. Do not breast-feed an infant while taking this medicine or for 3 months after the last dose. Your condition will be monitored carefully while you are receiving this medicine. You may need blood work done while you are taking this medicine. What side effects may I notice from receiving this medicine? Side effects that you should report to your doctor or health care professional as soon as possible:  allergic reactions like skin rash, itching or hives, swelling of the face, lips, or tongue  black, tarry stools  bloody or watery diarrhea  changes in vision  dizziness  eye pain  fast, irregular heartbeat  feeling anxious  feeling faint or lightheaded, falls  nausea, vomiting  pain, tingling, numbness in the hands or feet  redness, blistering, peeling or loosening of the skin, including inside the mouth  signs and symptoms of liver injury like dark yellow or brown urine; general ill feeling or flu-like symptoms; light-colored stools; loss of appetite; nausea; right upper belly pain; unusually weak or tired; yellowing of the eyes or skin  unusual bleeding or bruising Side effects that usually do not require medical attention (report to your doctor or health care professional if they continue or are bothersome):  headache  loss of appetite  trouble sleeping This list may not describe all possible side effects. Call your doctor for medical advice about side effects. You may report side effects to FDA at 1-800-FDA-1088. Where should I keep my medicine? This drug is given in a hospital or clinic and will not be stored at home. NOTE: This sheet is a summary. It may not cover all possible information. If you have questions about this medicine, talk to your doctor, pharmacist, or health care provider.  2021 Elsevier/Gold Standard (2019-11-10  18:53:00)    If you develop nausea and vomiting that is not controlled by your nausea medication, call the clinic.   BELOW ARE SYMPTOMS THAT SHOULD BE REPORTED IMMEDIATELY:  *FEVER GREATER THAN 100.5 F  *CHILLS WITH OR WITHOUT FEVER  NAUSEA AND VOMITING THAT IS NOT CONTROLLED WITH YOUR NAUSEA MEDICATION  *UNUSUAL SHORTNESS OF BREATH  *UNUSUAL BRUISING OR BLEEDING  TENDERNESS IN MOUTH AND THROAT WITH OR WITHOUT PRESENCE OF ULCERS  *URINARY PROBLEMS  *BOWEL PROBLEMS  UNUSUAL RASH Items with * indicate a potential emergency and should be followed up as soon as possible.  Feel free to call the clinic should you have any questions or concerns at The clinic phone number is [phone].  Please show the [facility] at check-in to the Emergency Department and triage nurse.

## 2021-02-05 NOTE — Progress Notes (Signed)
1234-Pt c/o pressure beneath lower sternum, stationary, non-radiating.  VS as on chart. Pt states that she is not in pain.  Rosanne Sack, PA informed, new orders received.

## 2021-02-06 ENCOUNTER — Telehealth: Payer: Self-pay

## 2021-02-06 NOTE — Telephone Encounter (Signed)
I spoke with pt to see how she did last night after infusion. She denies any further mid chest pain. She states, "I really think all of that was from the constipation. I took a half bottle of mag citrate last night, and I had good bowel movement. I have another one this morning too". She denies N/V, skin rashes & fever. Reminded pt of importance of calling if temp rises to 100.4 or over, day or night @ [phone]. Pt verbalized understanding, and thanked me for calling to check on her.      Per Ulice Dash, pharmacist..Marland KitchenMarland KitchenThis patient was a new start yesterday and at the end of treatment, she complained of mid-chest pain. She said that it felt like a tender spot. We monitored her for a while and gave some maalox. When she left, she seemed fine, and mentioned that she had been constipated and thinks that may have something to do with the pain. When you call her, will you ask about this and see if the pain resolved?

## 2021-02-09 DIAGNOSIS — C649 Malignant neoplasm of unspecified kidney, except renal pelvis: Secondary | ICD-10-CM | POA: Insufficient documentation

## 2021-02-09 NOTE — Progress Notes (Signed)
[facility]  [address] [place],  [place]  [address] [phone]  Clinic Day:  02/02/2021  Referring physician: Mateo Flow, MD   HISTORY OF PRESENT ILLNESS:  The patient is a 70 y.o. female with metastatic renal cell carcinoma, proven per a right-sided thoracentesis in early December 2021.  Recently, she had a biopsy of her right renal mass in January 2022, which confirmed clear cell carcinoma.  She comes in today to be evaluated before starting her 1st cycle of combination immunotherapy, which consists of nivolumab/ipilimumab.  Since her last visit, the patient has been doing okay.  However, she continues to cough, which concerns her for reaccumulation of malignant fluid in her right hemithorax.  Otherwise, she continues to function very well on a daily basis.  She denies having other symptoms/findings which concern her for overt signs of disease progression.    PHYSICAL EXAM:  Blood pressure (!) 144/93, pulse 99, temperature 97.9 F (36.6 C), resp. rate 16, height 5\' 4"  (1.626 m), weight 152 lb 11.2 oz (69.3 kg), SpO2 94 %. Wt Readings from Last 3 Encounters:  02/05/21 150 lb 4 oz (68.2 kg)  02/02/21 152 lb 11.2 oz (69.3 kg)  12/26/20 155 lb 4.8 oz (70.4 kg)   Body mass index is 26.21 kg/m. Performance status (ECOG): 0 Physical Exam Constitutional:      Appearance: Normal appearance. She is not ill-appearing.  HENT:     Mouth/Throat:     Mouth: Mucous membranes are moist.     Pharynx: Oropharynx is clear. No oropharyngeal exudate or posterior oropharyngeal erythema.  Cardiovascular:     Rate and Rhythm: Normal rate and regular rhythm.     Heart sounds: No murmur heard. No friction rub. No gallop.   Pulmonary:     Effort: Pulmonary effort is normal. No respiratory distress.     Breath sounds: Normal breath sounds. No wheezing, rhonchi or rales.  Chest:  Breasts:     Right: No axillary adenopathy or supraclavicular adenopathy.      Left: No axillary adenopathy or supraclavicular adenopathy.    Abdominal:     General: Bowel sounds are normal. There is no distension.     Palpations: Abdomen is soft. There is no mass.     Tenderness: There is no abdominal tenderness.  Musculoskeletal:        General: No swelling.     Right lower leg: No edema.     Left lower leg: No edema.  Lymphadenopathy:     Cervical: No cervical adenopathy.     Upper Body:     Right upper body: No supraclavicular or axillary adenopathy.     Left upper body: No supraclavicular or axillary adenopathy.     Lower Body: No right inguinal adenopathy. No left inguinal adenopathy.  Skin:    General: Skin is warm.     Coloration: Skin is not jaundiced.     Findings: No lesion or rash.  Neurological:     General: No focal deficit present.     Mental Status: She is alert and oriented to person, place, and time. Mental status is at baseline.     Cranial Nerves: Cranial nerves are intact.  Psychiatric:        Mood and Affect: Mood normal.        Behavior: Behavior normal.        Thought Content: Thought content normal.    LABS:   CBC Latest Ref Rng & Units 02/02/2021  01/01/2021 12/07/2020  WBC - 6.6 6.4 7.9  Hemoglobin 12.0 - 16.0 14.3 15.2 13.7  Hematocrit 36 - 46 43 33(A) 41  Platelets 150 - 399 199 192 196   CMP Latest Ref Rng & Units 02/02/2021 01/01/2021 12/07/2020  Glucose 70 - 99 mg/dL - - -  BUN 4 - 21 12 12 17   Creatinine 0.5 - 1.1 0.7 0.8 0.9  Sodium 137 - 147 137 141 139  Potassium 3.4 - 5.3 4.0 3.9 3.7  Chloride 99 - 108 105 108 106  CO2 13 - 22 23(A) 24(A) 25(A)  Calcium 8.7 - 10.7 8.2(A) 9.5 8.9  Total Protein 6.0 - 8.3 g/dL - - -  Total Bilirubin 0.2 - 1.2 mg/dL - - -  Alkaline Phos 25 - 125 63 71 76  AST 13 - 35 32 30 32  ALT 7 - 35 29 28 27     ASSESSMENT & PLAN:  A 70 y.o. female with metastatic renal cell carcinoma.  She will proceed with her 1st cycle of nivolumab/ipilimumab early next week.  She is aware of the side  effects to be potentially mindful of with this regimen, including severe shortness of breath, diarrhea and skin rashes.  I will see her back 3 weeks later before she heads into her 2nd cycle of combination immunotherapy.  The patient understands all the plans discussed today and is in agreement with them.   Shakeela Rabadan Macarthur Critchley, MD

## 2021-02-13 ENCOUNTER — Other Ambulatory Visit: Payer: Self-pay | Admitting: Hematology and Oncology

## 2021-02-13 MED ORDER — TRIAMCINOLONE ACETONIDE 0.5 % EX OINT: 1.0000 "application " | TOPICAL_OINTMENT | Freq: Two times a day (BID) | CUTANEOUS | 0 refills | Status: DC

## 2021-02-22 ENCOUNTER — Other Ambulatory Visit: Payer: Self-pay | Admitting: Oncology

## 2021-02-22 NOTE — Progress Notes (Signed)
[name] [name]  [address] [place],  [place]  [address] [phone]  Clinic Day:  02/23/2021  Referring physician: Mateo Flow, MD   HISTORY OF PRESENT ILLNESS:  The patient is a 70 y.o. female with metastatic renal cell carcinoma, proven per a right-sided thoracentesis in early December 2021.  Recently, she had a biopsy of her right renal mass in January 2022, which confirmed clear cell carcinoma.  She comes in today to be evaluated before proceeding with her 2nd cycle of combination immunotherapy, which consists of nivolumab/ipilimumab.  The patient claims to have tolerated her 1st cycle of combination immunotherapy very well.  With respect to her respriratory symptoms, she still coughs, but it is not as prevalent as it has been previously.  She denies having other symptoms which concern her for overt disease progression.  PHYSICAL EXAM:  Blood pressure (!) 169/74, pulse 71, temperature 97.9 F (36.6 C), resp. rate 16, height 5\' 4"  (1.626 m), weight 151 lb 14.4 oz (68.9 kg), SpO2 96 %. Wt Readings from Last 3 Encounters:  02/26/21 145 lb (65.8 kg)  02/23/21 151 lb 14.4 oz (68.9 kg)  02/05/21 150 lb 4 oz (68.2 kg)   Body mass index is 26.07 kg/m. Performance status (ECOG): 0 Physical Exam Constitutional:      Appearance: Normal appearance. She is not ill-appearing.  HENT:     Mouth/Throat:     Mouth: Mucous membranes are moist.     Pharynx: Oropharynx is clear. No oropharyngeal exudate or posterior oropharyngeal erythema.  Cardiovascular:     Rate and Rhythm: Normal rate and regular rhythm.     Heart sounds: No murmur heard. No friction rub. No gallop.   Pulmonary:     Effort: Pulmonary effort is normal. No respiratory distress.     Breath sounds: Normal breath sounds. No wheezing, rhonchi or rales.  Chest:  Breasts:     Right: No axillary adenopathy or supraclavicular adenopathy.     Left: No axillary adenopathy or supraclavicular  adenopathy.    Abdominal:     General: Bowel sounds are normal. There is no distension.     Palpations: Abdomen is soft. There is no mass.     Tenderness: There is no abdominal tenderness.  Musculoskeletal:        General: No swelling.     Right lower leg: No edema.     Left lower leg: No edema.  Lymphadenopathy:     Cervical: No cervical adenopathy.     Upper Body:     Right upper body: No supraclavicular or axillary adenopathy.     Left upper body: No supraclavicular or axillary adenopathy.     Lower Body: No right inguinal adenopathy. No left inguinal adenopathy.  Skin:    General: Skin is warm.     Coloration: Skin is not jaundiced.     Findings: No lesion or rash.  Neurological:     General: No focal deficit present.     Mental Status: She is alert and oriented to person, place, and time. Mental status is at baseline.     Cranial Nerves: Cranial nerves are intact.  Psychiatric:        Mood and Affect: Mood normal.        Behavior: Behavior normal.        Thought Content: Thought content normal.    LABS:   CBC Latest Ref Rng & Units 02/23/2021 02/02/2021 01/01/2021  WBC - 6.4 6.6 6.4  Hemoglobin  12.0 - 16.0 14.3 14.3 15.2  Hematocrit 36 - 46 42 43 33(A)  Platelets 150 - 399 170 199 192   CMP Latest Ref Rng & Units 02/23/2021 02/02/2021 01/01/2021  Glucose 70 - 99 mg/dL - - -  BUN 4 - 21 13 12 12   Creatinine 0.5 - 1.1 0.7 0.7 0.8  Sodium 137 - 147 137 137 141  Potassium 3.4 - 5.3 4.1 4.0 3.9  Chloride 99 - 108 105 105 108  CO2 13 - 22 23(A) 23(A) 24(A)  Calcium 8.7 - 10.7 8.9 8.2(A) 9.5  Total Protein 6.0 - 8.3 g/dL - - -  Total Bilirubin 0.2 - 1.2 mg/dL - - -  Alkaline Phos 25 - 125 67 63 71  AST 13 - 35 28 32 30  ALT 7 - 35 26 29 28     ASSESSMENT & PLAN:  A 70 y.o. female with metastatic renal cell carcinoma.  She will proceed with her 2nd cycle of nivolumab/ipilimumab early next week.  Clinically, the patient appears to be doing well.  I will see her back in 3  weeks before she heads into her 3rd cycle of combination immunotherapy.  The patient understands all the plans discussed today and is in agreement with them.   [name] Lahmann Macarthur Critchley, MD

## 2021-02-23 ENCOUNTER — Inpatient Hospital Stay (INDEPENDENT_AMBULATORY_CARE_PROVIDER_SITE_OTHER): Payer: Medicare PPO | Admitting: Oncology

## 2021-02-23 ENCOUNTER — Other Ambulatory Visit: Payer: Self-pay

## 2021-02-23 ENCOUNTER — Inpatient Hospital Stay: Payer: Medicare PPO | Attending: Oncology

## 2021-02-23 ENCOUNTER — Other Ambulatory Visit: Payer: Self-pay | Admitting: Oncology

## 2021-02-23 ENCOUNTER — Telehealth: Payer: Self-pay | Admitting: Oncology

## 2021-02-23 ENCOUNTER — Other Ambulatory Visit: Payer: Self-pay | Admitting: Hematology and Oncology

## 2021-02-23 VITALS — BP 169/74 | HR 71 | Temp 97.9°F | Resp 16 | Ht 64.0 in | Wt 151.9 lb

## 2021-02-23 DIAGNOSIS — Z79899 Other long term (current) drug therapy: Secondary | ICD-10-CM | POA: Diagnosis not present

## 2021-02-23 DIAGNOSIS — C641 Malignant neoplasm of right kidney, except renal pelvis: Secondary | ICD-10-CM | POA: Insufficient documentation

## 2021-02-23 DIAGNOSIS — D3001 Benign neoplasm of right kidney: Secondary | ICD-10-CM

## 2021-02-23 DIAGNOSIS — C649 Malignant neoplasm of unspecified kidney, except renal pelvis: Secondary | ICD-10-CM | POA: Diagnosis not present

## 2021-02-23 DIAGNOSIS — Z5112 Encounter for antineoplastic immunotherapy: Secondary | ICD-10-CM | POA: Diagnosis not present

## 2021-02-23 DIAGNOSIS — D649 Anemia, unspecified: Secondary | ICD-10-CM | POA: Diagnosis not present

## 2021-02-23 LAB — COMPREHENSIVE METABOLIC PANEL
Albumin: 4.4 (ref 3.5–5.0)
Calcium: 8.9 (ref 8.7–10.7)

## 2021-02-23 LAB — CBC AND DIFFERENTIAL
HCT: 42 (ref 36–46)
Hemoglobin: 14.3 (ref 12.0–16.0)
Platelets: 170 (ref 150–399)
WBC: 6.4

## 2021-02-23 LAB — TSH: TSH: 1.406 u[IU]/mL (ref 0.350–4.500)

## 2021-02-23 LAB — HEPATIC FUNCTION PANEL
ALT: 26 (ref 7–35)
AST: 28 (ref 13–35)
Alkaline Phosphatase: 67 (ref 25–125)
Bilirubin, Total: 0.6

## 2021-02-23 LAB — CBC: RBC: 4.33 (ref 3.87–5.11)

## 2021-02-23 LAB — BASIC METABOLIC PANEL
BUN: 13 (ref 4–21)
CO2: 23 — AB (ref 13–22)
Chloride: 105 (ref 99–108)
Creatinine: 0.7 (ref 0.5–1.1)
Glucose: 89
Potassium: 4.1 (ref 3.4–5.3)
Sodium: 137 (ref 137–147)

## 2021-02-23 NOTE — Telephone Encounter (Signed)
Per 3/4 los next appt sched and given to patient

## 2021-02-25 LAB — T4: T4, Total: 6.2 ug/dL (ref 4.5–12.0)

## 2021-02-26 ENCOUNTER — Other Ambulatory Visit: Payer: Self-pay | Admitting: Hematology and Oncology

## 2021-02-26 ENCOUNTER — Inpatient Hospital Stay: Payer: Medicare PPO

## 2021-02-26 ENCOUNTER — Other Ambulatory Visit: Payer: Self-pay

## 2021-02-26 VITALS — BP 159/72 | HR 77 | Temp 98.1°F | Resp 18 | Ht 64.0 in | Wt 145.0 lb

## 2021-02-26 DIAGNOSIS — C641 Malignant neoplasm of right kidney, except renal pelvis: Secondary | ICD-10-CM | POA: Diagnosis not present

## 2021-02-26 DIAGNOSIS — Z5112 Encounter for antineoplastic immunotherapy: Secondary | ICD-10-CM | POA: Diagnosis not present

## 2021-02-26 DIAGNOSIS — Z79899 Other long term (current) drug therapy: Secondary | ICD-10-CM | POA: Diagnosis not present

## 2021-02-26 DIAGNOSIS — D3001 Benign neoplasm of right kidney: Secondary | ICD-10-CM

## 2021-02-26 MED ORDER — DIPHENHYDRAMINE HCL 50 MG/ML IJ SOLN
25.0000 mg | Freq: Once | INTRAMUSCULAR | Status: AC
  Administered 2021-02-26: 25 mg via INTRAVENOUS

## 2021-02-26 MED ORDER — SODIUM CHLORIDE 0.9 % IV SOLN
2.8450 mg/kg | Freq: Once | INTRAVENOUS | Status: AC
  Administered 2021-02-26: 200 mg via INTRAVENOUS
  Filled 2021-02-26: qty 20

## 2021-02-26 MED ORDER — HEPARIN SOD (PORK) LOCK FLUSH 100 UNIT/ML IV SOLN
500.0000 [IU] | Freq: Once | INTRAVENOUS | Status: AC | PRN
  Administered 2021-02-26: 500 [IU]
  Filled 2021-02-26: qty 5

## 2021-02-26 MED ORDER — SODIUM CHLORIDE 0.9 % IV SOLN
Freq: Once | INTRAVENOUS | Status: AC
  Filled 2021-02-26: qty 250

## 2021-02-26 MED ORDER — DIPHENHYDRAMINE HCL 50 MG/ML IJ SOLN
INTRAMUSCULAR | Status: AC
  Filled 2021-02-26: qty 1

## 2021-02-26 MED ORDER — FAMOTIDINE IN NACL 20-0.9 MG/50ML-% IV SOLN
20.0000 mg | Freq: Once | INTRAVENOUS | Status: AC
  Administered 2021-02-26: 20 mg via INTRAVENOUS

## 2021-02-26 MED ORDER — TRIAMCINOLONE ACETONIDE 0.1 % EX CREA: 1.0000 "application " | TOPICAL_CREAM | Freq: Two times a day (BID) | CUTANEOUS | 1 refills | Status: DC

## 2021-02-26 MED ORDER — SODIUM CHLORIDE 0.9 % IV SOLN
1.0000 mg/kg | Freq: Once | INTRAVENOUS | Status: AC
  Administered 2021-02-26: 70 mg via INTRAVENOUS
  Filled 2021-02-26: qty 14

## 2021-02-26 MED ORDER — FAMOTIDINE IN NACL 20-0.9 MG/50ML-% IV SOLN
INTRAVENOUS | Status: AC
  Filled 2021-02-26: qty 50

## 2021-02-26 NOTE — Patient Instructions (Signed)
[facility] Discharge Instructions for Patients Receiving Chemotherapy  Today you received the following chemotherapy agents Nivolumab,Ipilimumab  To help prevent nausea and vomiting after your treatment, we encourage you to take your nausea medication as directed   If you develop nausea and vomiting that is not controlled by your nausea medication, call the clinic.   BELOW ARE SYMPTOMS THAT SHOULD BE REPORTED IMMEDIATELY:  *FEVER GREATER THAN 100.5 F  *CHILLS WITH OR WITHOUT FEVER  NAUSEA AND VOMITING THAT IS NOT CONTROLLED WITH YOUR NAUSEA MEDICATION  *UNUSUAL SHORTNESS OF BREATH  *UNUSUAL BRUISING OR BLEEDING  TENDERNESS IN MOUTH AND THROAT WITH OR WITHOUT PRESENCE OF ULCERS  *URINARY PROBLEMS  *BOWEL PROBLEMS  UNUSUAL RASH Items with * indicate a potential emergency and should be followed up as soon as possible.  Feel free to call the clinic should you have any questions or concerns at The clinic phone number is [phone].  Please show the CHEMO ALERT CARD at check-in to the Emergency Department and triage nurse.   

## 2021-02-26 NOTE — Progress Notes (Signed)
1615: PT STABLE AT TIME OF DISCHARGE

## 2021-03-06 DIAGNOSIS — H25811 Combined forms of age-related cataract, right eye: Secondary | ICD-10-CM | POA: Diagnosis not present

## 2021-03-06 DIAGNOSIS — H2512 Age-related nuclear cataract, left eye: Secondary | ICD-10-CM | POA: Diagnosis not present

## 2021-03-06 DIAGNOSIS — E7800 Pure hypercholesterolemia, unspecified: Secondary | ICD-10-CM | POA: Diagnosis not present

## 2021-03-06 DIAGNOSIS — Z79899 Other long term (current) drug therapy: Secondary | ICD-10-CM | POA: Diagnosis not present

## 2021-03-07 ENCOUNTER — Telehealth: Payer: Self-pay | Admitting: Oncology

## 2021-03-07 NOTE — Telephone Encounter (Signed)
03/07/21 Spoke with patient and rescheduled appt

## 2021-03-08 DIAGNOSIS — H8109 Meniere's disease, unspecified ear: Secondary | ICD-10-CM | POA: Diagnosis not present

## 2021-03-08 DIAGNOSIS — H903 Sensorineural hearing loss, bilateral: Secondary | ICD-10-CM | POA: Diagnosis not present

## 2021-03-13 DIAGNOSIS — Z1231 Encounter for screening mammogram for malignant neoplasm of breast: Secondary | ICD-10-CM | POA: Diagnosis not present

## 2021-03-14 DIAGNOSIS — Z6826 Body mass index (BMI) 26.0-26.9, adult: Secondary | ICD-10-CM | POA: Diagnosis not present

## 2021-03-14 DIAGNOSIS — C641 Malignant neoplasm of right kidney, except renal pelvis: Secondary | ICD-10-CM | POA: Diagnosis not present

## 2021-03-14 DIAGNOSIS — Z0000 Encounter for general adult medical examination without abnormal findings: Secondary | ICD-10-CM | POA: Diagnosis not present

## 2021-03-14 DIAGNOSIS — E7800 Pure hypercholesterolemia, unspecified: Secondary | ICD-10-CM | POA: Diagnosis not present

## 2021-03-14 NOTE — Progress Notes (Signed)
[facility]  [address] [place],  [place]  [address] [phone]  Clinic Day:  03/15/2021  Referring physician: Mateo Flow, MD   HISTORY OF PRESENT ILLNESS:  The patient is a 70 y.o. female with metastatic renal cell carcinoma, proven per a right-sided thoracentesis in early December 2021.  Recently, she had a biopsy of her right renal mass in January 2022, which confirmed clear cell carcinoma.  She comes in today to be evaluated before proceeding with her 3rd cycle of combination immunotherapy, which consists of nivolumab/ipilimumab.  The patient claims to have tolerated her 2nd cycle of combination immunotherapy fairly well.  She is noticing progressive fatigue with each successive cycle of combination immunotherapy.  With respect to her respriratory symptoms, she still coughs, but it is not as prevalent as it has been previously.  She denies having other symptoms which concern her for overt disease progression.  PHYSICAL EXAM:  Blood pressure (!) 163/74, pulse 88, temperature 98.3 F (36.8 C), resp. rate 16, height 5\' 4"  (1.626 m), weight 149 lb 8 oz (67.8 kg), SpO2 95 %. Wt Readings from Last 3 Encounters:  03/22/21 147 lb (66.7 kg)  03/19/21 149 lb 8 oz (67.8 kg)  03/15/21 149 lb 8 oz (67.8 kg)   Body mass index is 25.66 kg/m. Performance status (ECOG): 0 Physical Exam Constitutional:      Appearance: Normal appearance. She is not ill-appearing.  HENT:     Mouth/Throat:     Mouth: Mucous membranes are moist.     Pharynx: Oropharynx is clear. No oropharyngeal exudate or posterior oropharyngeal erythema.  Cardiovascular:     Rate and Rhythm: Normal rate and regular rhythm.     Heart sounds: No murmur heard. No friction rub. No gallop.   Pulmonary:     Effort: Pulmonary effort is normal. No respiratory distress.     Breath sounds: Normal breath sounds. No wheezing, rhonchi or rales.  Chest:  Breasts:     Right: No axillary adenopathy  or supraclavicular adenopathy.     Left: No axillary adenopathy or supraclavicular adenopathy.    Abdominal:     General: Bowel sounds are normal. There is no distension.     Palpations: Abdomen is soft. There is no mass.     Tenderness: There is no abdominal tenderness.  Musculoskeletal:        General: No swelling.     Right lower leg: No edema.     Left lower leg: No edema.  Lymphadenopathy:     Cervical: No cervical adenopathy.     Upper Body:     Right upper body: No supraclavicular or axillary adenopathy.     Left upper body: No supraclavicular or axillary adenopathy.     Lower Body: No right inguinal adenopathy. No left inguinal adenopathy.  Skin:    General: Skin is warm.     Coloration: Skin is not jaundiced.     Findings: No lesion or rash.  Neurological:     General: No focal deficit present.     Mental Status: She is alert and oriented to person, place, and time. Mental status is at baseline.     Cranial Nerves: Cranial nerves are intact.  Psychiatric:        Mood and Affect: Mood normal.        Behavior: Behavior normal.        Thought Content: Thought content normal.    LABS:  Results for [name], [name] (MRN  [id number]) as of 03/25/2021 09:54  Ref. Range 03/15/2021 00:00 03/15/2021 09:12  Sodium Latest Ref Range: 137 - 147  138   Potassium Latest Ref Range: 3.4 - 5.3  3.4   Chloride Latest Ref Range: 99 - 108  103   CO2 Latest Ref Range: 13 - 22  24 (A)   Glucose Unknown 139   BUN Latest Ref Range: 4 - 21  9   Creatinine Latest Ref Range: 0.5 - 1.1  0.6   Calcium Latest Ref Range: 8.7 - 10.7  8.6 (A)   Alkaline Phosphatase Latest Ref Range: 25 - 125  88   Albumin Latest Ref Range: 3.5 - 5.0  4.4   AST Latest Ref Range: 13 - 35  29   ALT Latest Ref Range: 7 - 35  26   Bilirubin, Total Unknown 0.6   CBC W DIFFERENTIAL ([facility] SCANNED REPORT) Unknown  Rpt  WBC Unknown 6.1   RBC Latest Ref Range: 3.87 - 5.11  4.2   Hemoglobin Latest Ref Range: 12.0 -  16.0  13.7   HCT Latest Ref Range: 36 - 46  40   MCV Latest Ref Range: 81 - 99  96   Platelets Latest Ref Range: 150 - 399  161   NEUT# Unknown 3.84   TSH Latest Ref Range: 0.350 - 4.500 uIU/mL  1.747  Thyroxine (T4) Latest Ref Range: 4.5 - 12.0 ug/dL  7.8     ASSESSMENT & PLAN:  A 70 y.o. female with metastatic renal cell carcinoma.  She will proceed with her 3rd cycle of nivolumab/ipilimumab early next week.  Clinically, the patient appears to be doing fine.  She knows to let us know if she develops progressive side effects from her immunotherapy to where supportive care would be necessary.  Otherwise, I will see her back in 3 weeks before she heads into her 4th and final cycle of combination immunotherapy.  The patient understands all the plans discussed today and is in agreement with them.   Keyon Liller Macarthur Critchley, MD

## 2021-03-15 ENCOUNTER — Inpatient Hospital Stay: Payer: Medicare PPO

## 2021-03-15 ENCOUNTER — Inpatient Hospital Stay: Payer: Medicare PPO | Admitting: Oncology

## 2021-03-15 ENCOUNTER — Other Ambulatory Visit: Payer: Self-pay

## 2021-03-15 ENCOUNTER — Other Ambulatory Visit: Payer: Self-pay | Admitting: Hematology and Oncology

## 2021-03-15 ENCOUNTER — Telehealth: Payer: Self-pay | Admitting: Oncology

## 2021-03-15 ENCOUNTER — Other Ambulatory Visit: Payer: Self-pay | Admitting: Oncology

## 2021-03-15 VITALS — BP 163/74 | HR 88 | Temp 98.3°F | Resp 16 | Ht 64.0 in | Wt 149.5 lb

## 2021-03-15 DIAGNOSIS — C641 Malignant neoplasm of right kidney, except renal pelvis: Secondary | ICD-10-CM

## 2021-03-15 DIAGNOSIS — Z79899 Other long term (current) drug therapy: Secondary | ICD-10-CM | POA: Diagnosis not present

## 2021-03-15 DIAGNOSIS — D3001 Benign neoplasm of right kidney: Secondary | ICD-10-CM

## 2021-03-15 DIAGNOSIS — C649 Malignant neoplasm of unspecified kidney, except renal pelvis: Secondary | ICD-10-CM | POA: Diagnosis not present

## 2021-03-15 DIAGNOSIS — D649 Anemia, unspecified: Secondary | ICD-10-CM | POA: Diagnosis not present

## 2021-03-15 DIAGNOSIS — Z5112 Encounter for antineoplastic immunotherapy: Secondary | ICD-10-CM | POA: Diagnosis not present

## 2021-03-15 LAB — CBC
MCV: 96 (ref 81–99)
RBC: 4.2 (ref 3.87–5.11)

## 2021-03-15 LAB — CBC AND DIFFERENTIAL
HCT: 40 (ref 36–46)
Hemoglobin: 13.7 (ref 12.0–16.0)
Neutrophils Absolute: 3.84
Platelets: 161 (ref 150–399)
WBC: 6.1

## 2021-03-15 LAB — BASIC METABOLIC PANEL
BUN: 9 (ref 4–21)
CO2: 24 — AB (ref 13–22)
Chloride: 103 (ref 99–108)
Creatinine: 0.6 (ref 0.5–1.1)
Glucose: 139
Potassium: 3.4 (ref 3.4–5.3)
Sodium: 138 (ref 137–147)

## 2021-03-15 LAB — HEPATIC FUNCTION PANEL
ALT: 26 (ref 7–35)
AST: 29 (ref 13–35)
Alkaline Phosphatase: 88 (ref 25–125)
Bilirubin, Total: 0.6

## 2021-03-15 LAB — TSH: TSH: 1.747 u[IU]/mL (ref 0.350–4.500)

## 2021-03-15 LAB — COMPREHENSIVE METABOLIC PANEL
Albumin: 4.4 (ref 3.5–5.0)
Calcium: 8.6 — AB (ref 8.7–10.7)

## 2021-03-15 NOTE — Telephone Encounter (Signed)
Per 3/24 los next appt scheduled and given to patient 

## 2021-03-16 ENCOUNTER — Other Ambulatory Visit: Payer: Medicare PPO

## 2021-03-16 ENCOUNTER — Ambulatory Visit: Payer: Medicare PPO | Admitting: Oncology

## 2021-03-16 LAB — T4: T4, Total: 7.8 ug/dL (ref 4.5–12.0)

## 2021-03-19 ENCOUNTER — Inpatient Hospital Stay: Payer: Medicare PPO

## 2021-03-19 ENCOUNTER — Other Ambulatory Visit: Payer: Self-pay

## 2021-03-19 VITALS — BP 148/72 | HR 89 | Temp 98.4°F | Resp 18 | Ht 64.0 in | Wt 149.5 lb

## 2021-03-19 DIAGNOSIS — C641 Malignant neoplasm of right kidney, except renal pelvis: Secondary | ICD-10-CM | POA: Diagnosis not present

## 2021-03-19 DIAGNOSIS — Z5112 Encounter for antineoplastic immunotherapy: Secondary | ICD-10-CM | POA: Diagnosis not present

## 2021-03-19 DIAGNOSIS — D3001 Benign neoplasm of right kidney: Secondary | ICD-10-CM

## 2021-03-19 DIAGNOSIS — Z79899 Other long term (current) drug therapy: Secondary | ICD-10-CM | POA: Diagnosis not present

## 2021-03-19 MED ORDER — DIPHENHYDRAMINE HCL 50 MG/ML IJ SOLN
25.0000 mg | Freq: Once | INTRAMUSCULAR | Status: AC
Start: 2021-03-19 — End: 2021-03-19
  Administered 2021-03-19: 25 mg via INTRAVENOUS

## 2021-03-19 MED ORDER — SODIUM CHLORIDE 0.9 % IV SOLN
1.0000 mg/kg | Freq: Once | INTRAVENOUS | Status: AC
  Administered 2021-03-19: 70 mg via INTRAVENOUS
  Filled 2021-03-19: qty 14

## 2021-03-19 MED ORDER — NIVOLUMAB CHEMO INJECTION 100 MG/10ML
2.8450 mg/kg | Freq: Once | INTRAVENOUS | Status: AC
  Administered 2021-03-19: 200 mg via INTRAVENOUS
  Filled 2021-03-19: qty 20

## 2021-03-19 MED ORDER — DIPHENHYDRAMINE HCL 50 MG/ML IJ SOLN
INTRAMUSCULAR | Status: AC
  Filled 2021-03-19: qty 1

## 2021-03-19 MED ORDER — HEPARIN SOD (PORK) LOCK FLUSH 100 UNIT/ML IV SOLN
500.0000 [IU] | Freq: Once | INTRAVENOUS | Status: AC | PRN
  Administered 2021-03-19: 500 [IU]
  Filled 2021-03-19: qty 5

## 2021-03-19 MED ORDER — FAMOTIDINE IN NACL 20-0.9 MG/50ML-% IV SOLN
20.0000 mg | Freq: Once | INTRAVENOUS | Status: AC
  Administered 2021-03-19: 20 mg via INTRAVENOUS

## 2021-03-19 MED ORDER — SODIUM CHLORIDE 0.9 % IV SOLN
Freq: Once | INTRAVENOUS | Status: AC
  Filled 2021-03-19: qty 250

## 2021-03-19 MED ORDER — FAMOTIDINE IN NACL 20-0.9 MG/50ML-% IV SOLN
INTRAVENOUS | Status: AC
  Filled 2021-03-19: qty 50

## 2021-03-19 NOTE — Patient Instructions (Signed)
[facility] Discharge Instructions for Patients Receiving Chemotherapy  Today you received the following chemotherapy agents Nivolumab,Ipilimumab  To help prevent nausea and vomiting after your treatment, we encourage you to take your nausea medication as directed   If you develop nausea and vomiting that is not controlled by your nausea medication, call the clinic.   BELOW ARE SYMPTOMS THAT SHOULD BE REPORTED IMMEDIATELY:  *FEVER GREATER THAN 100.5 F  *CHILLS WITH OR WITHOUT FEVER  NAUSEA AND VOMITING THAT IS NOT CONTROLLED WITH YOUR NAUSEA MEDICATION  *UNUSUAL SHORTNESS OF BREATH  *UNUSUAL BRUISING OR BLEEDING  TENDERNESS IN MOUTH AND THROAT WITH OR WITHOUT PRESENCE OF ULCERS  *URINARY PROBLEMS  *BOWEL PROBLEMS  UNUSUAL RASH Items with * indicate a potential emergency and should be followed up as soon as possible.  Feel free to call the clinic should you have any questions or concerns at The clinic phone number is [phone].  Please show the CHEMO ALERT CARD at check-in to the Emergency Department and triage nurse.   

## 2021-03-19 NOTE — Progress Notes (Signed)
1532: PT STABLE AT TIME OF DISCHARGE

## 2021-03-22 ENCOUNTER — Encounter: Payer: Self-pay | Admitting: Hematology and Oncology

## 2021-03-22 ENCOUNTER — Other Ambulatory Visit: Payer: Self-pay | Admitting: Hematology and Oncology

## 2021-03-22 ENCOUNTER — Inpatient Hospital Stay: Payer: Medicare PPO

## 2021-03-22 ENCOUNTER — Inpatient Hospital Stay (INDEPENDENT_AMBULATORY_CARE_PROVIDER_SITE_OTHER): Payer: Medicare PPO | Admitting: Hematology and Oncology

## 2021-03-22 ENCOUNTER — Telehealth: Payer: Self-pay | Admitting: Hematology and Oncology

## 2021-03-22 ENCOUNTER — Other Ambulatory Visit: Payer: Self-pay

## 2021-03-22 ENCOUNTER — Telehealth: Payer: Self-pay

## 2021-03-22 VITALS — BP 139/66 | HR 88 | Temp 98.6°F | Resp 18 | Ht 64.0 in | Wt 147.0 lb

## 2021-03-22 DIAGNOSIS — D3001 Benign neoplasm of right kidney: Secondary | ICD-10-CM

## 2021-03-22 DIAGNOSIS — R0689 Other abnormalities of breathing: Secondary | ICD-10-CM

## 2021-03-22 DIAGNOSIS — Z79899 Other long term (current) drug therapy: Secondary | ICD-10-CM | POA: Diagnosis not present

## 2021-03-22 DIAGNOSIS — E860 Dehydration: Secondary | ICD-10-CM | POA: Diagnosis not present

## 2021-03-22 DIAGNOSIS — I517 Cardiomegaly: Secondary | ICD-10-CM | POA: Diagnosis not present

## 2021-03-22 DIAGNOSIS — R0602 Shortness of breath: Secondary | ICD-10-CM | POA: Diagnosis not present

## 2021-03-22 DIAGNOSIS — J90 Pleural effusion, not elsewhere classified: Secondary | ICD-10-CM

## 2021-03-22 DIAGNOSIS — C641 Malignant neoplasm of right kidney, except renal pelvis: Secondary | ICD-10-CM | POA: Diagnosis not present

## 2021-03-22 DIAGNOSIS — Z5112 Encounter for antineoplastic immunotherapy: Secondary | ICD-10-CM | POA: Diagnosis not present

## 2021-03-22 DIAGNOSIS — C3490 Malignant neoplasm of unspecified part of unspecified bronchus or lung: Secondary | ICD-10-CM | POA: Diagnosis not present

## 2021-03-22 DIAGNOSIS — I498 Other specified cardiac arrhythmias: Secondary | ICD-10-CM | POA: Diagnosis not present

## 2021-03-22 LAB — BASIC METABOLIC PANEL
BUN: 12 (ref 4–21)
CO2: 22 (ref 13–22)
Chloride: 100 (ref 99–108)
Creatinine: 0.6 (ref 0.5–1.1)
Glucose: 215
Potassium: 3.2 — AB (ref 3.4–5.3)
Sodium: 133 — AB (ref 137–147)

## 2021-03-22 LAB — CBC AND DIFFERENTIAL
HCT: 37 (ref 36–46)
Hemoglobin: 12.7 (ref 12.0–16.0)
Neutrophils Absolute: 6.32
Platelets: 198 (ref 150–399)
WBC: 7.9

## 2021-03-22 LAB — COMPREHENSIVE METABOLIC PANEL
Albumin: 3.9 (ref 3.5–5.0)
Calcium: 8.4 — AB (ref 8.7–10.7)

## 2021-03-22 LAB — HEPATIC FUNCTION PANEL
ALT: 24 (ref 7–35)
AST: 29 (ref 13–35)
Alkaline Phosphatase: 71 (ref 25–125)
Bilirubin, Total: 0.7

## 2021-03-22 LAB — CBC
MCV: 94 (ref 81–99)
RBC: 3.97 (ref 3.87–5.11)

## 2021-03-22 LAB — TSH: TSH: 0.747 u[IU]/mL (ref 0.350–4.500)

## 2021-03-22 MED ORDER — HEPARIN SOD (PORK) LOCK FLUSH 100 UNIT/ML IV SOLN
500.0000 [IU] | Freq: Once | INTRAVENOUS | Status: AC | PRN
  Administered 2021-03-22: 500 [IU]
  Filled 2021-03-22: qty 5

## 2021-03-22 MED ORDER — SODIUM CHLORIDE 0.9 % IV SOLN
Freq: Once | INTRAVENOUS | Status: AC
  Filled 2021-03-22: qty 250

## 2021-03-22 MED ORDER — SODIUM CHLORIDE 0.9% FLUSH
10.0000 mL | Freq: Once | INTRAVENOUS | Status: AC | PRN
  Administered 2021-03-22: 10 mL
  Filled 2021-03-22: qty 10

## 2021-03-22 NOTE — Telephone Encounter (Signed)
Per Vida Roller, write order for X-Ray Chest for Decreased Breathe Sound - Pleural Sound

## 2021-03-22 NOTE — Telephone Encounter (Addendum)
Pt will be here within 10-15 min.  ----- Message from Marvia Pickles, PA-C sent at 03/22/2021  1:07 PM EDT ----- Regarding: RE: "I feel like crap" Put her in asap with me with labs ----- Message ----- From: Dairl Ponder, RN Sent: 03/22/2021  12:51 PM EDT To: Melodye Ped, NP, Marvia Pickles, PA-C Subject: "I feel like crap"                             Pt had last Ipi, Nivo on 03/19/21, Cycle 3. She reports, "I just feel like crap. I have had chills, but no fever. No energy. I feel like a limp noodle. I go from the bed to the recliner and nap. I make myself eat, but I don't have an appetite". Pt admits to diarrhea 4-5 times/day, - only has taken Imodium a couple of times. I educated her on the correct administration of the Imodium, up to 8 tabs/day. Also, I told her the importance of increasing her po fluids while having diarrhea. She mentioned she noticed blurred vision and a headache for last 2 days. No falls.  Please advise.  I sent this to you both, as Melissa has 3 B[facility]CP his pm and Vida Roller has training for something @ infusion this pm.  Thanks!

## 2021-03-22 NOTE — Progress Notes (Signed)
[facility]  [address] [place],  [place]  [address] [phone]  Clinic Day:  03/22/2021  Referring physician: Mateo Flow, MD   CHIEF COMPLAINT:  CC:  Severe fatigue, new onset headache and blurry vision  Current Treatment:   Ipilimumab Ardine Eng  HISTORY OF PRESENT ILLNESS:  [name] [name] is a 70 y.o. female with metastatic renal cell carcinoma to the lung, proven per a right-sided thoracentesis in early December 2021.  Biopsy of her right renal mass in January 2022, which confirmed clear cell carcinoma.  She received a 3rd cycle of combination immunotherapy, which consists of nivolumab/ipilimumab on March 28th. She claim to have tolerated her 2nd cycle of nivolumab /ipilimumab without difficulty  She has a persistent cough though not as prevalent as it has been previously.  INTERVAL HISTORY:  [name] [name] is added to the schedule today as she has been feeling poorly since her infusion on Monday. She reports severe fatigue and generalized weakness.  She reports dizziness, intermittent headache and blurry vision. She attributes the dizziness to vertigo.  She is on histamine shots for this.  She attributes the blurry vision to recent eye exam with dilation. She reports intermittent nausea without vomiting, as well as diarrhea several times a day.  She doesn't like to take medications, so the diarrhea is not controlled.  She denies increased cough or dyspnea.  She has had some chills with diaphoresis since yesterday.  She then states "she feels like she has the flu". She denies fevers. She denies pain. Her appetite is poor. Her weight has decreased 2 pounds over last 3 days.  REVIEW OF SYSTEMS:  Review of Systems  Constitutional: Positive for appetite change, chills, diaphoresis and fatigue. Negative for fever and unexpected weight change.  HENT:   Negative for lump/mass, mouth sores and sore throat.   Respiratory: Positive for cough (chronic, stable).  Negative for hemoptysis and shortness of breath.   Cardiovascular: Negative for chest pain, leg swelling and palpitations.  Gastrointestinal: Negative for abdominal pain, constipation, diarrhea, nausea and vomiting.  Endocrine: Negative for hot flashes.  Genitourinary: Negative for difficulty urinating, dysuria, frequency and hematuria.   Musculoskeletal: Negative for arthralgias, back pain and myalgias.  Skin: Negative for rash.  Neurological: Positive for dizziness and light-headedness. Negative for headaches.  Hematological: Negative for adenopathy. Does not bruise/bleed easily.  Psychiatric/Behavioral: Negative for depression and sleep disturbance. The patient is not nervous/anxious.      VITALS:  Temperature 98.5 F (36.9 C), temperature source Oral, resp. rate (!) 24, height 5\' 4"  (1.626 m), weight 147 lb (66.7 kg), SpO2 95 %.   Standing blood pressure is 113/61.  Wt Readings from Last 3 Encounters:  03/22/21 147 lb (66.7 kg)  03/19/21 149 lb 8 oz (67.8 kg)  03/15/21 149 lb 8 oz (67.8 kg)    Body mass index is 25.23 kg/m.  Performance status (ECOG): 2 - Symptomatic, <50% confined to bed  PHYSICAL EXAM:  Physical Exam Vitals and nursing note reviewed.  Constitutional:      General: She is not in acute distress.    Appearance: Normal appearance. She is ill-appearing and diaphoretic.  HENT:     Head: Normocephalic and atraumatic.     Mouth/Throat:     Mouth: Mucous membranes are dry.     Pharynx: Oropharynx is clear. No oropharyngeal exudate or posterior oropharyngeal erythema.  Eyes:     General: No scleral icterus.    Extraocular Movements: Extraocular movements intact.  Conjunctiva/sclera: Conjunctivae normal.     Pupils: Pupils are equal, round, and reactive to light.  Cardiovascular:     Rate and Rhythm: Normal rate. Rhythm irregular. Occasional extrasystoles are present.    Heart sounds: Normal heart sounds. No murmur heard. No friction rub. No gallop.    Pulmonary:     Effort: Pulmonary effort is normal.     Breath sounds: Examination of the right-lower field reveals decreased breath sounds. Examination of the left-lower field reveals decreased breath sounds. Decreased breath sounds present. No wheezing, rhonchi or rales.  Chest:  Breasts:     Right: No axillary adenopathy or supraclavicular adenopathy.     Left: No axillary adenopathy or supraclavicular adenopathy.    Abdominal:     General: There is no distension.     Palpations: Abdomen is soft. There is no hepatomegaly, splenomegaly or mass.     Tenderness: There is no abdominal tenderness.  Musculoskeletal:        General: Normal range of motion.     Cervical back: Normal range of motion and neck supple. No tenderness.     Right lower leg: No edema.     Left lower leg: No edema.  Lymphadenopathy:     Cervical: No cervical adenopathy.     Upper Body:     Right upper body: No supraclavicular or axillary adenopathy.     Left upper body: No supraclavicular or axillary adenopathy.     Lower Body: No right inguinal adenopathy. No left inguinal adenopathy.  Skin:    General: Skin is warm and moist.     Coloration: Skin is not jaundiced.     Findings: No rash.     Comments:   Skin turgor is decreased  Neurological:     Mental Status: She is alert and oriented to person, place, and time.     Cranial Nerves: No cranial nerve deficit.  Psychiatric:        Mood and Affect: Mood normal.        Behavior: Behavior normal.        Thought Content: Thought content normal.    LABS:   CBC Latest Ref Rng & Units 03/22/2021 03/15/2021 02/23/2021  WBC - 7.9 6.1 6.4  Hemoglobin 12.0 - 16.0 12.7 13.7 14.3  Hematocrit 36 - 46 37 40 42  Platelets 150 - 399 198 161 170   CMP Latest Ref Rng & Units 03/22/2021 03/15/2021 02/23/2021  Glucose 70 - 99 mg/dL - - -  BUN 4 - 21 12 9 13   Creatinine 0.5 - 1.1 0.6 0.6 0.7  Sodium 137 - 147 133(A) 138 137  Potassium 3.4 - 5.3 3.2(A) 3.4 4.1  Chloride 99 -  108 100 103 105  CO2 13 - 22 22 24(A) 23(A)  Calcium 8.7 - 10.7 8.4(A) 8.6(A) 8.9  Total Protein 6.0 - 8.3 g/dL - - -  Total Bilirubin 0.2 - 1.2 mg/dL - - -  Alkaline Phos 25 - 125 71 88 67  AST 13 - 35 29 29 28   ALT 7 - 35 24 26 26      No results found for: CEA1 / No results found for: CEA1 No results found for: PSA1 No results found for: NOB096 No results found for: CAN125  No results found for: TOTALPROTELP, ALBUMINELP, A1GS, A2GS, BETS, BETA2SER, GAMS, MSPIKE, SPEI No results found for: TIBC, FERRITIN, IRONPCTSAT Lab Results  Component Value Date   LDH 180 11/28/2020    STUDIES:  No results found.  HISTORY:   Past Medical History:  Diagnosis Date  . Arthritis   . Bilateral pneumothoraces   . Carcinoma, basal cell, skin   . Constipation   . Hyperlipidemia   . Squamous cell skin cancer   . Vitamin B12 deficiency   . Vitamin D deficiency     Past Surgical History:  Procedure Laterality Date  . ABDOMINAL HYSTERECTOMY    . APPENDECTOMY    . KNEE SURGERY Right    TOTAL REPLACEMENT  . RECONSTRUCTION OF EYELID Bilateral    LOWER LID REPAIR POST CANCER EXCISION  . TUBAL LIGATION    . WISDOM TOOTH EXTRACTION      Family History  Problem Relation Age of Onset  . Pancreatic cancer Mother   . Breast cancer Mother   . Melanoma Mother   . Diabetes Father   . Heart attack Father     Social History:  reports that she quit smoking about 20 years ago. Her smoking use included cigarettes. She has a 49.50 pack-year smoking history. She has never used smokeless tobacco. She reports current alcohol use. She reports previous drug use.The patient is alone today.  Allergies:  Allergies  Allergen Reactions  . Gentamicin     Eyes burning    . Codeine Itching    Itching inside of the body, and "crazy"  . Demerol Hcl [Meperidine] Itching  . Fml [Fluorometholone]     Eyes burning   . Morphine And Related Itching    Itching inside the body, and "crazy"  .  Neomycin-Polymyxin-Gramicidin     Eyes burning   . Prednisone Itching  . Sulfa Antibiotics Itching  . Tobramycin     Eyes burning   . Tramadol Hcl Itching    Itching inside the body    Current Medications: Current Outpatient Medications  Medication Sig Dispense Refill  . amitriptyline (ELAVIL) 25 MG tablet Take 25 mg by mouth daily.    Marland Kitchen atorvastatin (LIPITOR) 20 MG tablet Take 20 mg by mouth at bedtime.    . Calcium Carbonate-Vitamin D (CALCIUM-VITAMIN D) 600-125 MG-UNIT TABS Take 1 tablet by mouth 2 (two) times daily.    . Cholecalciferol (VITAMIN D) 50 MCG (2000 UT) CAPS Take 2,000 Units by mouth daily.    . citalopram (CELEXA) 20 MG tablet Take 20 mg by mouth daily.    Marland Kitchen docusate sodium (COLACE) 100 MG capsule Take 100 mg by mouth daily.    Marland Kitchen estrogens, conjugated, (PREMARIN) 0.3 MG tablet Take 0.3 mg by mouth every other day.    . famotidine (PEPCID) 20 MG tablet Take 20 mg by mouth daily.    Marland Kitchen gabapentin (NEURONTIN) 100 MG capsule Take 200 mg by mouth at bedtime.    . Inulin (FIBER CHOICE PO) Take 1 tablet by mouth 3 times/day as needed-between meals & bedtime.    . naproxen sodium (ALEVE) 220 MG tablet Take 220 mg by mouth 2 (two) times daily with a meal.    . ondansetron (ZOFRAN) 4 MG tablet Take 1 tablet (4 mg total) by mouth every 4 (four) hours as needed for nausea or vomiting. 30 tablet 1  . prochlorperazine (COMPAZINE) 10 MG tablet Take 1 tablet (10 mg total) by mouth every 6 (six) hours as needed for nausea or vomiting. 30 tablet 1  . triamcinolone (KENALOG) 0.1 % Apply 1 application topically 2 (two) times daily. 453.6 g 1  . vitamin B-12 (CYANOCOBALAMIN) 1000 MCG tablet Take 1,000 mcg by mouth daily.     No  current facility-administered medications for this visit.     ASSESSMENT & PLAN:   Assessment/Plan:  Tarrah Furuta is a 70 y.o. female with metastatic renal cell carcinoma. She has multiple complaints today many of which can be associated with the  immunotherapy. She appears clinically dehydrated, so we will give her IV fluids today. I will also check a flu and COVID test today.  If positive for COVID, we will refer her to [facility].  If this is negative, I will plan to obtain a chest x-ray. We will plan to see her in 2 weeks before she heads into her 4th and final cycle of combination immunotherapy.  The patient understands all the plans discussed today and is in agreement with them. If she has persistent symptoms, she will contact us.   I provided 30 minutes of face-to-face time during this this encounter and > 50% was spent counseling as documented under my assessment and plan.   COVID in flu testing were negative so we will proceed with a chest x-ray.    Marvia Pickles, PA-C

## 2021-03-23 LAB — T4: T4, Total: 8.4 ug/dL (ref 4.5–12.0)

## 2021-03-28 ENCOUNTER — Telehealth: Payer: Self-pay

## 2021-03-28 ENCOUNTER — Other Ambulatory Visit: Payer: Self-pay | Admitting: Hematology and Oncology

## 2021-03-28 NOTE — Telephone Encounter (Signed)
Pt calls and states, "I'm having a hell of a time with this Yervoy and Opdivo. I think I need more fluids again. I have extreme weakness. I'm nauseous and have diarrhea". I asked if she felt like she did last week when she came in. She replied, "worse". I asked how many emesis and diarrhea in last 24 hours. She replied, "I have vomited once. I've had 1 diarrhea today, but I threw up the Imodium".  Afebrile (Temp 97.3 @ present). She is still having intermittent chills, and her cough is the same. No falls.   I sent the above message to [facility], as well as spoke with [facility]. Pt to be placed on schedule for early morning.

## 2021-03-28 NOTE — Progress Notes (Signed)
[name] [name]  [address] [place],  [place]  [address] [phone]  Clinic Day:  03/29/2021  Referring physician: Marvia Pickles, PA-C   CHIEF COMPLAINT:  CC:  Severe fatigue, new onset headache and blurry vision  Current Treatment:   Ipilimumab Ardine Eng  HISTORY OF PRESENT ILLNESS:  [name] [name] is a 70 y.o. female with metastatic renal cell carcinoma to the lung, proven per a right-sided thoracentesis in early December 2021.  Biopsy of her right renal mass in January 2022, which confirmed clear cell carcinoma.  She received a 3rd cycle of combination immunotherapy, which consists of nivolumab/ipilimumab on March 28th. She claim to have tolerated her 2nd cycle of nivolumab /ipilimumab without difficulty  She has had a persistent cough though not as prevalent as it has been previously.  She was added to the schedule on March 31st as she had been doing poorly since her nivolumab/ipilimumab on March 28th.  She had severe fatigue, general weakness, dizziness, headaches, vision changes, nausea and diarrhea.  COVID and flu testing was negative.  She was given IV fluids with improvement. Chest x-ray revealed stable right pleural effusion.  EKG revealed  Sinus rhythm with marked sinus arrhythmia with premature superventricular complexes.  INTERVAL HISTORY:  [name] [name] is added to the schedule today as she continues to feel poorly. She feels she may be dehydrated again. She continues to report severe fatigue and generalized weakness.  She continues to report dizziness, intermittent headache and blurry vision.  She reports intermittent nausea without vomiting, as well as diarrhea several times a day.  She doesn't like to take medications, so the diarrhea is not controlled.  She denies increased cough or dyspnea.  She continues to report chills, but denies fevers. She denies pain. Her appetite remains poor. Her weight has decreased 2 pounds over last 3 days.  REVIEW  OF SYSTEMS:  Review of Systems  Constitutional: Positive for appetite change, chills and fatigue. Negative for fever and unexpected weight change.  HENT:   Negative for lump/mass, mouth sores, sore throat and trouble swallowing.   Respiratory: Negative for cough and shortness of breath.   Cardiovascular: Negative for chest pain and leg swelling.  Gastrointestinal: Negative for abdominal pain, constipation, diarrhea, nausea and vomiting.  Endocrine: Negative for hot flashes.  Genitourinary: Negative for difficulty urinating, dysuria, frequency and hematuria.   Musculoskeletal: Negative for arthralgias, back pain and myalgias.  Skin: Negative for rash.  Neurological: Negative for dizziness and headaches.  Hematological: Negative for adenopathy. Does not bruise/bleed easily.  Psychiatric/Behavioral: Negative for depression and sleep disturbance. The patient is not nervous/anxious.      VITALS:  Blood pressure 120/63, pulse 97, temperature 98.6 F (37 C), temperature source Oral, resp. rate 18, height 5\' 4"  (1.626 m), SpO2 93 %.    Standing blood pressure is 130/59.  Wt Readings from Last 3 Encounters:  03/22/21 147 lb (66.7 kg)  03/19/21 149 lb 8 oz (67.8 kg)  03/15/21 149 lb 8 oz (67.8 kg)    Body mass index is 25.23 kg/m.  Performance status (ECOG): 2 - Symptomatic, <50% confined to bed  PHYSICAL EXAM:  Physical Exam Vitals and nursing note reviewed.  Constitutional:      General: She is not in acute distress.    Appearance: Normal appearance. She is ill-appearing.  HENT:     Head: Normocephalic and atraumatic.     Mouth/Throat:     Mouth: Mucous membranes are dry.     Pharynx: Oropharynx  is clear. No oropharyngeal exudate or posterior oropharyngeal erythema.  Eyes:     General: No scleral icterus.    Extraocular Movements: Extraocular movements intact.     Conjunctiva/sclera: Conjunctivae normal.     Pupils: Pupils are equal, round, and reactive to light.   Cardiovascular:     Rate and Rhythm: Normal rate and regular rhythm.     Heart sounds: Normal heart sounds. No murmur heard. No friction rub. No gallop.   Pulmonary:     Effort: Pulmonary effort is normal.     Breath sounds: Examination of the right-lower field reveals decreased breath sounds. Decreased breath sounds present. No wheezing, rhonchi or rales.  Chest:  Breasts:     Right: No axillary adenopathy or supraclavicular adenopathy.     Left: No axillary adenopathy or supraclavicular adenopathy.    Abdominal:     General: There is no distension.     Palpations: Abdomen is soft. There is no hepatomegaly, splenomegaly or mass.     Tenderness: There is no abdominal tenderness.  Musculoskeletal:        General: Normal range of motion.     Cervical back: Normal range of motion and neck supple. No tenderness.     Right lower leg: No edema.     Left lower leg: No edema.  Lymphadenopathy:     Cervical: No cervical adenopathy.     Upper Body:     Right upper body: No supraclavicular or axillary adenopathy.     Left upper body: No supraclavicular or axillary adenopathy.     Lower Body: No right inguinal adenopathy. No left inguinal adenopathy.  Skin:    General: Skin is warm and dry.     Coloration: Skin is not jaundiced.     Findings: No rash.  Neurological:     Mental Status: She is oriented to person, place, and time. She is lethargic.     Cranial Nerves: Cranial nerves are intact. No cranial nerve deficit.     Sensory: Sensation is intact.     Motor: Motor function is intact.     Coordination: Coordination normal. Finger-Nose-Finger Test abnormal. Impaired rapid alternating movements.     Comments:  Patient is slow to answer questions and perform neurologic tests.  Finger-to-nose is slow and imprecise. Rapid alternating movements are very slow.  Psychiatric:        Mood and Affect: Mood normal.        Behavior: Behavior normal.        Thought Content: Thought content  normal.    LABS:   CBC Latest Ref Rng & Units 03/29/2021 03/22/2021 03/15/2021  WBC - 6.6 7.9 6.1  Hemoglobin 12.0 - 16.0 12.7 12.7 13.7  Hematocrit 36 - 46 38 37 40  Platelets 150 - 399 250 198 161   CMP Latest Ref Rng & Units 03/29/2021 03/22/2021 03/15/2021  Glucose 70 - 99 mg/dL - - -  BUN 4 - 21 13 12 9   Creatinine 0.5 - 1.1 0.7 0.6 0.6  Sodium 137 - 147 128(A) 133(A) 138  Potassium 3.4 - 5.3 3.1(A) 3.2(A) 3.4  Chloride 99 - 108 91(A) 100 103  CO2 13 - 22 25(A) 22 24(A)  Calcium 8.7 - 10.7 8.1(A) 8.4(A) 8.6(A)  Total Protein 6.0 - 8.3 g/dL - - -  Total Bilirubin 0.2 - 1.2 mg/dL - - -  Alkaline Phos 25 - 125 86 71 88  AST 13 - 35 37(A) 29 29  ALT 7 - 35 22  24 26     No results found for: CEA1 / No results found for: CEA1 No results found for: PSA1 No results found for: PRF163 No results found for: CAN125  No results found for: TOTALPROTELP, ALBUMINELP, A1GS, A2GS, BETS, BETA2SER, GAMS, MSPIKE, SPEI No results found for: TIBC, FERRITIN, IRONPCTSAT Lab Results  Component Value Date   LDH 180 11/28/2020    STUDIES:  No results found.  Exam(s): [id number] CT/CT HEAD WO/W CM CLINICAL DATA:  Dizziness, blurry vision, history of lung and kidney cancer  EXAM: CT HEAD WITHOUT AND WITH CONTRAST  TECHNIQUE: Contiguous axial images were obtained from the base of the skull through the vertex without and with intravenous contrast  CONTRAST:  100 mL Isovue 370  COMPARISON:  None.  FINDINGS: Brain: There is no acute intracranial hemorrhage, mass, mass effect, or edema. No abnormal enhancement. Gray-white differentiation is preserved. There is no extra-axial fluid collection. Prominence of the ventricles and sulci reflects mild generalized parenchymal volume loss. Patchy and confluent areas of hypoattenuation in the supratentorial white matter nonspecific but may reflect chronic microvascular ischemic and/or therapy related changes.  Vascular: No hyperdense vessel. There is  intracranial atherosclerotic calcification at the skull base.  Skull: Calvarium is unremarkable.  Sinuses/Orbits: No acute finding.  Other: None.  IMPRESSION: No acute intracranial abnormality. No evidence of intracranial metastatic disease.  Chronic microvascular ischemic changes.    HISTORY:   Past Medical History:  Diagnosis Date  . Arthritis   . Bilateral pneumothoraces   . Carcinoma, basal cell, skin   . Constipation   . Hyperlipidemia   . Squamous cell skin cancer   . Vitamin B12 deficiency   . Vitamin D deficiency     Past Surgical History:  Procedure Laterality Date  . ABDOMINAL HYSTERECTOMY    . APPENDECTOMY    . KNEE SURGERY Right    TOTAL REPLACEMENT  . RECONSTRUCTION OF EYELID Bilateral    LOWER LID REPAIR POST CANCER EXCISION  . TUBAL LIGATION    . WISDOM TOOTH EXTRACTION      Family History  Problem Relation Age of Onset  . Pancreatic cancer Mother   . Breast cancer Mother   . Melanoma Mother   . Diabetes Father   . Heart attack Father     Social History:  reports that she quit smoking about 20 years ago. Her smoking use included cigarettes. She has a 49.50 pack-year smoking history. She has never used smokeless tobacco. She reports current alcohol use. She reports previous drug use.The patient is alone today.  Allergies:  Allergies  Allergen Reactions  . Gentamicin     Eyes burning    . Codeine Itching    Itching inside of the body, and "crazy"  . Demerol Hcl [Meperidine] Itching  . Fml [Fluorometholone]     Eyes burning   . Morphine And Related Itching    Itching inside the body, and "crazy"  . Neomycin-Polymyxin-Gramicidin     Eyes burning   . Prednisone Itching  . Sulfa Antibiotics Itching  . Tobramycin     Eyes burning   . Tramadol Hcl Itching    Itching inside the body    Current Medications: Current Outpatient Medications  Medication Sig Dispense Refill  . amitriptyline (ELAVIL) 25 MG tablet Take 25 mg by mouth  daily.    Marland Kitchen atorvastatin (LIPITOR) 20 MG tablet Take 20 mg by mouth at bedtime.    . Calcium Carbonate-Vitamin D (CALCIUM-VITAMIN D) 600-125 MG-UNIT TABS Take 1 tablet  by mouth 2 (two) times daily.    . Cholecalciferol (VITAMIN D) 50 MCG (2000 UT) CAPS Take 2,000 Units by mouth daily.    . citalopram (CELEXA) 20 MG tablet Take 20 mg by mouth daily.    Marland Kitchen docusate sodium (COLACE) 100 MG capsule Take 100 mg by mouth daily.    Marland Kitchen estrogens, conjugated, (PREMARIN) 0.3 MG tablet Take 0.3 mg by mouth every other day.    . famotidine (PEPCID) 20 MG tablet Take 20 mg by mouth daily.    Marland Kitchen gabapentin (NEURONTIN) 100 MG capsule Take 200 mg by mouth at bedtime.    . Inulin (FIBER CHOICE PO) Take 1 tablet by mouth 3 times/day as needed-between meals & bedtime.    . naproxen sodium (ALEVE) 220 MG tablet Take 220 mg by mouth 2 (two) times daily with a meal.    . ondansetron (ZOFRAN) 4 MG tablet Take 1 tablet (4 mg total) by mouth every 4 (four) hours as needed for nausea or vomiting. 30 tablet 1  . prochlorperazine (COMPAZINE) 10 MG tablet Take 1 tablet (10 mg total) by mouth every 6 (six) hours as needed for nausea or vomiting. 30 tablet 1  . triamcinolone (KENALOG) 0.1 % Apply 1 application topically 2 (two) times daily. 453.6 g 1  . vitamin B-12 (CYANOCOBALAMIN) 1000 MCG tablet Take 1,000 mcg by mouth daily.     No current facility-administered medications for this visit.     ASSESSMENT & PLAN:   Assessment/Plan:  [name] [name] is a 70 y.o. female with metastatic renal cell carcinoma to lung. Given her neurologic symptoms, we were concerned for possible brain metastasis. CT head with and without contrast did not reveal any acute intracranial metastasis.  We would like her to be evaluated further in the emergency room as her symptoms could be secondary to sepsis, especially given the bandemia. We will plan to see her back as scheduled before she heads into her 4th and final cycle of combination  immunotherapy.  The patient understands all the plans discussed today and is in agreement with them. If she has persistent symptoms, she will contact us.   I provided 30 minutes of face-to-face time during this this encounter and > 50% was spent counseling as documented under my assessment and plan.       Marvia Pickles, PA-C

## 2021-03-29 ENCOUNTER — Encounter: Payer: Self-pay | Admitting: Hematology and Oncology

## 2021-03-29 ENCOUNTER — Other Ambulatory Visit: Payer: Self-pay

## 2021-03-29 ENCOUNTER — Inpatient Hospital Stay: Payer: Medicare PPO | Attending: Oncology | Admitting: Hematology and Oncology

## 2021-03-29 ENCOUNTER — Inpatient Hospital Stay: Payer: Medicare PPO

## 2021-03-29 VITALS — BP 120/63 | HR 97 | Temp 98.6°F | Resp 18 | Ht 64.0 in

## 2021-03-29 DIAGNOSIS — C3410 Malignant neoplasm of upper lobe, unspecified bronchus or lung: Secondary | ICD-10-CM

## 2021-03-29 DIAGNOSIS — T451X5A Adverse effect of antineoplastic and immunosuppressive drugs, initial encounter: Secondary | ICD-10-CM

## 2021-03-29 DIAGNOSIS — R112 Nausea with vomiting, unspecified: Secondary | ICD-10-CM | POA: Diagnosis not present

## 2021-03-29 DIAGNOSIS — R5383 Other fatigue: Secondary | ICD-10-CM | POA: Diagnosis not present

## 2021-03-29 DIAGNOSIS — H8109 Meniere's disease, unspecified ear: Secondary | ICD-10-CM | POA: Diagnosis not present

## 2021-03-29 DIAGNOSIS — R29818 Other symptoms and signs involving the nervous system: Secondary | ICD-10-CM | POA: Insufficient documentation

## 2021-03-29 DIAGNOSIS — Z452 Encounter for adjustment and management of vascular access device: Secondary | ICD-10-CM | POA: Diagnosis not present

## 2021-03-29 DIAGNOSIS — R519 Headache, unspecified: Secondary | ICD-10-CM | POA: Insufficient documentation

## 2021-03-29 DIAGNOSIS — Z8709 Personal history of other diseases of the respiratory system: Secondary | ICD-10-CM | POA: Diagnosis not present

## 2021-03-29 DIAGNOSIS — Z87891 Personal history of nicotine dependence: Secondary | ICD-10-CM | POA: Diagnosis not present

## 2021-03-29 DIAGNOSIS — J90 Pleural effusion, not elsewhere classified: Secondary | ICD-10-CM | POA: Diagnosis not present

## 2021-03-29 DIAGNOSIS — D3001 Benign neoplasm of right kidney: Secondary | ICD-10-CM

## 2021-03-29 DIAGNOSIS — R109 Unspecified abdominal pain: Secondary | ICD-10-CM | POA: Diagnosis not present

## 2021-03-29 DIAGNOSIS — C7800 Secondary malignant neoplasm of unspecified lung: Secondary | ICD-10-CM | POA: Diagnosis not present

## 2021-03-29 DIAGNOSIS — D72825 Bandemia: Secondary | ICD-10-CM | POA: Diagnosis not present

## 2021-03-29 DIAGNOSIS — R739 Hyperglycemia, unspecified: Secondary | ICD-10-CM | POA: Diagnosis not present

## 2021-03-29 DIAGNOSIS — C7801 Secondary malignant neoplasm of right lung: Secondary | ICD-10-CM | POA: Diagnosis not present

## 2021-03-29 DIAGNOSIS — B9689 Other specified bacterial agents as the cause of diseases classified elsewhere: Secondary | ICD-10-CM | POA: Diagnosis not present

## 2021-03-29 DIAGNOSIS — K5100 Ulcerative (chronic) pancolitis without complications: Secondary | ICD-10-CM | POA: Diagnosis not present

## 2021-03-29 DIAGNOSIS — J9811 Atelectasis: Secondary | ICD-10-CM | POA: Diagnosis not present

## 2021-03-29 DIAGNOSIS — R42 Dizziness and giddiness: Secondary | ICD-10-CM | POA: Diagnosis not present

## 2021-03-29 DIAGNOSIS — Z85118 Personal history of other malignant neoplasm of bronchus and lung: Secondary | ICD-10-CM | POA: Diagnosis not present

## 2021-03-29 DIAGNOSIS — J189 Pneumonia, unspecified organism: Secondary | ICD-10-CM | POA: Diagnosis not present

## 2021-03-29 DIAGNOSIS — R531 Weakness: Secondary | ICD-10-CM

## 2021-03-29 DIAGNOSIS — C641 Malignant neoplasm of right kidney, except renal pelvis: Secondary | ICD-10-CM | POA: Diagnosis present

## 2021-03-29 DIAGNOSIS — H538 Other visual disturbances: Secondary | ICD-10-CM | POA: Diagnosis not present

## 2021-03-29 DIAGNOSIS — R110 Nausea: Secondary | ICD-10-CM | POA: Diagnosis not present

## 2021-03-29 DIAGNOSIS — J9690 Respiratory failure, unspecified, unspecified whether with hypoxia or hypercapnia: Secondary | ICD-10-CM | POA: Diagnosis not present

## 2021-03-29 DIAGNOSIS — E871 Hypo-osmolality and hyponatremia: Secondary | ICD-10-CM | POA: Diagnosis not present

## 2021-03-29 DIAGNOSIS — Z85528 Personal history of other malignant neoplasm of kidney: Secondary | ICD-10-CM | POA: Diagnosis not present

## 2021-03-29 DIAGNOSIS — E860 Dehydration: Secondary | ICD-10-CM | POA: Diagnosis not present

## 2021-03-29 DIAGNOSIS — N2889 Other specified disorders of kidney and ureter: Secondary | ICD-10-CM | POA: Diagnosis not present

## 2021-03-29 LAB — COMPREHENSIVE METABOLIC PANEL
Albumin: 3.4 — AB (ref 3.5–5.0)
Calcium: 8.1 — AB (ref 8.7–10.7)

## 2021-03-29 LAB — HEPATIC FUNCTION PANEL
ALT: 22 (ref 7–35)
AST: 37 — AB (ref 13–35)
Alkaline Phosphatase: 86 (ref 25–125)
Bilirubin, Total: 1

## 2021-03-29 LAB — CBC AND DIFFERENTIAL
HCT: 38 (ref 36–46)
Hemoglobin: 12.7 (ref 12.0–16.0)
Neutrophils Absolute: 4.49
Platelets: 250 (ref 150–399)
WBC: 6.6

## 2021-03-29 LAB — CBC
MCV: 93 (ref 81–99)
RBC: 4.08 (ref 3.87–5.11)

## 2021-03-29 LAB — TSH: TSH: 1.299 u[IU]/mL (ref 0.350–4.500)

## 2021-03-29 LAB — BASIC METABOLIC PANEL
BUN: 13 (ref 4–21)
CO2: 25 — AB (ref 13–22)
Chloride: 91 — AB (ref 99–108)
Creatinine: 0.7 (ref 0.5–1.1)
Glucose: 166
Potassium: 3.1 — AB (ref 3.4–5.3)
Sodium: 128 — AB (ref 137–147)

## 2021-03-29 MED ORDER — ONDANSETRON HCL 4 MG PO TABS: 4.0000 mg | ORAL_TABLET | ORAL | 2 refills | Status: DC | PRN

## 2021-03-29 NOTE — Telephone Encounter (Signed)
Pt in clinic to see [facility], today. Pt ill appearing per notes, CT ordered.

## 2021-03-29 NOTE — Progress Notes (Signed)
At 1310 patient transported to ED via wheelchair.  She tolerated trip well.

## 2021-03-30 DIAGNOSIS — H8109 Meniere's disease, unspecified ear: Secondary | ICD-10-CM | POA: Diagnosis not present

## 2021-03-30 LAB — T4: T4, Total: 7.5 ug/dL (ref 4.5–12.0)

## 2021-04-03 ENCOUNTER — Other Ambulatory Visit: Payer: Self-pay | Admitting: Hematology and Oncology

## 2021-04-03 ENCOUNTER — Telehealth: Payer: Self-pay

## 2021-04-03 NOTE — Telephone Encounter (Signed)
I called pt & told her we usually see our pt's 7-10 days after hospital discharge. She will call us when she is discharged to R/S appt's for 4/15 & 4/18.

## 2021-04-04 DIAGNOSIS — C3410 Malignant neoplasm of upper lobe, unspecified bronchus or lung: Secondary | ICD-10-CM | POA: Diagnosis not present

## 2021-04-06 ENCOUNTER — Inpatient Hospital Stay: Payer: Medicare PPO | Admitting: Oncology

## 2021-04-06 ENCOUNTER — Inpatient Hospital Stay: Payer: Medicare PPO

## 2021-04-09 ENCOUNTER — Inpatient Hospital Stay: Payer: Medicare PPO

## 2021-04-13 ENCOUNTER — Telehealth: Payer: Self-pay

## 2021-04-13 DIAGNOSIS — C641 Malignant neoplasm of right kidney, except renal pelvis: Secondary | ICD-10-CM | POA: Diagnosis not present

## 2021-04-13 DIAGNOSIS — J159 Unspecified bacterial pneumonia: Secondary | ICD-10-CM | POA: Diagnosis not present

## 2021-04-13 DIAGNOSIS — I10 Essential (primary) hypertension: Secondary | ICD-10-CM | POA: Diagnosis not present

## 2021-04-13 DIAGNOSIS — E860 Dehydration: Secondary | ICD-10-CM | POA: Diagnosis not present

## 2021-04-13 DIAGNOSIS — E871 Hypo-osmolality and hyponatremia: Secondary | ICD-10-CM | POA: Diagnosis not present

## 2021-04-13 DIAGNOSIS — K51011 Ulcerative (chronic) pancolitis with rectal bleeding: Secondary | ICD-10-CM | POA: Diagnosis not present

## 2021-04-13 DIAGNOSIS — C7800 Secondary malignant neoplasm of unspecified lung: Secondary | ICD-10-CM | POA: Diagnosis not present

## 2021-04-13 DIAGNOSIS — J90 Pleural effusion, not elsewhere classified: Secondary | ICD-10-CM | POA: Diagnosis not present

## 2021-04-13 DIAGNOSIS — R739 Hyperglycemia, unspecified: Secondary | ICD-10-CM | POA: Diagnosis not present

## 2021-04-13 NOTE — Telephone Encounter (Addendum)
I reported information to [employer]. She reviewed pt's discharge summary from hospital. Pt was just discharged on 05/11/21. Melissa recommends that if bleeding persists and/or gets heavier, she hsould be taken to the emergency room. She also wanted to make sure pt is not taking any type of blood thinner & advises pt to increase hydration.    RE: Hematuria, blood per rectum Received: Today Melodye Ped, NP  Dairl Ponder, RN Phone Number: [phone]   She was hospitalized with colitis related to her immunotherapy. She most likely has cystitis related to this as well. She has been treated with antibiotics and remains on steroids. We need to monitor and send to ED over the weekend if worsens.   I called Katie,RN, back & gave her the above recommendations from [facility]. Katie confirmed pt is not taking any blood thinners. Winfred Burn, told pt she is on call over the weekend, and was free to call her anytime if needed.  [phone]

## 2021-04-16 DIAGNOSIS — C641 Malignant neoplasm of right kidney, except renal pelvis: Secondary | ICD-10-CM | POA: Diagnosis not present

## 2021-04-16 DIAGNOSIS — Z6823 Body mass index (BMI) 23.0-23.9, adult: Secondary | ICD-10-CM | POA: Diagnosis not present

## 2021-04-16 DIAGNOSIS — K529 Noninfective gastroenteritis and colitis, unspecified: Secondary | ICD-10-CM | POA: Diagnosis not present

## 2021-04-16 DIAGNOSIS — E7800 Pure hypercholesterolemia, unspecified: Secondary | ICD-10-CM | POA: Diagnosis not present

## 2021-04-17 DIAGNOSIS — E860 Dehydration: Secondary | ICD-10-CM | POA: Diagnosis not present

## 2021-04-17 DIAGNOSIS — R739 Hyperglycemia, unspecified: Secondary | ICD-10-CM | POA: Diagnosis not present

## 2021-04-17 DIAGNOSIS — E871 Hypo-osmolality and hyponatremia: Secondary | ICD-10-CM | POA: Diagnosis not present

## 2021-04-17 DIAGNOSIS — C641 Malignant neoplasm of right kidney, except renal pelvis: Secondary | ICD-10-CM | POA: Diagnosis not present

## 2021-04-17 DIAGNOSIS — I10 Essential (primary) hypertension: Secondary | ICD-10-CM | POA: Diagnosis not present

## 2021-04-17 DIAGNOSIS — C7800 Secondary malignant neoplasm of unspecified lung: Secondary | ICD-10-CM | POA: Diagnosis not present

## 2021-04-17 DIAGNOSIS — J159 Unspecified bacterial pneumonia: Secondary | ICD-10-CM | POA: Diagnosis not present

## 2021-04-17 DIAGNOSIS — K51011 Ulcerative (chronic) pancolitis with rectal bleeding: Secondary | ICD-10-CM | POA: Diagnosis not present

## 2021-04-17 DIAGNOSIS — J90 Pleural effusion, not elsewhere classified: Secondary | ICD-10-CM | POA: Diagnosis not present

## 2021-04-18 DIAGNOSIS — R739 Hyperglycemia, unspecified: Secondary | ICD-10-CM | POA: Diagnosis not present

## 2021-04-18 DIAGNOSIS — C641 Malignant neoplasm of right kidney, except renal pelvis: Secondary | ICD-10-CM | POA: Diagnosis not present

## 2021-04-18 DIAGNOSIS — J159 Unspecified bacterial pneumonia: Secondary | ICD-10-CM | POA: Diagnosis not present

## 2021-04-18 DIAGNOSIS — I10 Essential (primary) hypertension: Secondary | ICD-10-CM | POA: Diagnosis not present

## 2021-04-18 DIAGNOSIS — C7800 Secondary malignant neoplasm of unspecified lung: Secondary | ICD-10-CM | POA: Diagnosis not present

## 2021-04-18 DIAGNOSIS — J90 Pleural effusion, not elsewhere classified: Secondary | ICD-10-CM | POA: Diagnosis not present

## 2021-04-18 DIAGNOSIS — E871 Hypo-osmolality and hyponatremia: Secondary | ICD-10-CM | POA: Diagnosis not present

## 2021-04-18 DIAGNOSIS — E860 Dehydration: Secondary | ICD-10-CM | POA: Diagnosis not present

## 2021-04-18 DIAGNOSIS — K51011 Ulcerative (chronic) pancolitis with rectal bleeding: Secondary | ICD-10-CM | POA: Diagnosis not present

## 2021-04-19 DIAGNOSIS — I10 Essential (primary) hypertension: Secondary | ICD-10-CM | POA: Diagnosis not present

## 2021-04-19 DIAGNOSIS — E871 Hypo-osmolality and hyponatremia: Secondary | ICD-10-CM | POA: Diagnosis not present

## 2021-04-19 DIAGNOSIS — R739 Hyperglycemia, unspecified: Secondary | ICD-10-CM | POA: Diagnosis not present

## 2021-04-19 DIAGNOSIS — C7800 Secondary malignant neoplasm of unspecified lung: Secondary | ICD-10-CM | POA: Diagnosis not present

## 2021-04-19 DIAGNOSIS — J90 Pleural effusion, not elsewhere classified: Secondary | ICD-10-CM | POA: Diagnosis not present

## 2021-04-19 DIAGNOSIS — E860 Dehydration: Secondary | ICD-10-CM | POA: Diagnosis not present

## 2021-04-19 DIAGNOSIS — J159 Unspecified bacterial pneumonia: Secondary | ICD-10-CM | POA: Diagnosis not present

## 2021-04-19 DIAGNOSIS — K51011 Ulcerative (chronic) pancolitis with rectal bleeding: Secondary | ICD-10-CM | POA: Diagnosis not present

## 2021-04-19 DIAGNOSIS — C641 Malignant neoplasm of right kidney, except renal pelvis: Secondary | ICD-10-CM | POA: Diagnosis not present

## 2021-04-20 ENCOUNTER — Inpatient Hospital Stay: Payer: Medicare PPO

## 2021-04-20 ENCOUNTER — Inpatient Hospital Stay: Payer: Medicare PPO | Admitting: Oncology

## 2021-04-20 DIAGNOSIS — C7800 Secondary malignant neoplasm of unspecified lung: Secondary | ICD-10-CM | POA: Diagnosis not present

## 2021-04-20 DIAGNOSIS — C641 Malignant neoplasm of right kidney, except renal pelvis: Secondary | ICD-10-CM | POA: Diagnosis not present

## 2021-04-20 DIAGNOSIS — J159 Unspecified bacterial pneumonia: Secondary | ICD-10-CM | POA: Diagnosis not present

## 2021-04-20 DIAGNOSIS — J90 Pleural effusion, not elsewhere classified: Secondary | ICD-10-CM | POA: Diagnosis not present

## 2021-04-20 DIAGNOSIS — K51011 Ulcerative (chronic) pancolitis with rectal bleeding: Secondary | ICD-10-CM | POA: Diagnosis not present

## 2021-04-20 DIAGNOSIS — I10 Essential (primary) hypertension: Secondary | ICD-10-CM | POA: Diagnosis not present

## 2021-04-20 DIAGNOSIS — R739 Hyperglycemia, unspecified: Secondary | ICD-10-CM | POA: Diagnosis not present

## 2021-04-20 DIAGNOSIS — E860 Dehydration: Secondary | ICD-10-CM | POA: Diagnosis not present

## 2021-04-20 DIAGNOSIS — E871 Hypo-osmolality and hyponatremia: Secondary | ICD-10-CM | POA: Diagnosis not present

## 2021-04-23 DIAGNOSIS — E871 Hypo-osmolality and hyponatremia: Secondary | ICD-10-CM | POA: Diagnosis not present

## 2021-04-23 DIAGNOSIS — C7800 Secondary malignant neoplasm of unspecified lung: Secondary | ICD-10-CM | POA: Diagnosis not present

## 2021-04-23 DIAGNOSIS — J159 Unspecified bacterial pneumonia: Secondary | ICD-10-CM | POA: Diagnosis not present

## 2021-04-23 DIAGNOSIS — E860 Dehydration: Secondary | ICD-10-CM | POA: Diagnosis not present

## 2021-04-23 DIAGNOSIS — R739 Hyperglycemia, unspecified: Secondary | ICD-10-CM | POA: Diagnosis not present

## 2021-04-23 DIAGNOSIS — J90 Pleural effusion, not elsewhere classified: Secondary | ICD-10-CM | POA: Diagnosis not present

## 2021-04-23 DIAGNOSIS — C641 Malignant neoplasm of right kidney, except renal pelvis: Secondary | ICD-10-CM | POA: Diagnosis not present

## 2021-04-23 DIAGNOSIS — I10 Essential (primary) hypertension: Secondary | ICD-10-CM | POA: Diagnosis not present

## 2021-04-23 DIAGNOSIS — K51011 Ulcerative (chronic) pancolitis with rectal bleeding: Secondary | ICD-10-CM | POA: Diagnosis not present

## 2021-04-24 ENCOUNTER — Other Ambulatory Visit: Payer: Medicare PPO

## 2021-04-25 ENCOUNTER — Other Ambulatory Visit: Payer: Self-pay | Admitting: Oncology

## 2021-04-25 DIAGNOSIS — C7800 Secondary malignant neoplasm of unspecified lung: Secondary | ICD-10-CM | POA: Diagnosis not present

## 2021-04-25 DIAGNOSIS — I10 Essential (primary) hypertension: Secondary | ICD-10-CM | POA: Diagnosis not present

## 2021-04-25 DIAGNOSIS — C641 Malignant neoplasm of right kidney, except renal pelvis: Secondary | ICD-10-CM | POA: Diagnosis not present

## 2021-04-25 DIAGNOSIS — J159 Unspecified bacterial pneumonia: Secondary | ICD-10-CM | POA: Diagnosis not present

## 2021-04-25 DIAGNOSIS — R739 Hyperglycemia, unspecified: Secondary | ICD-10-CM | POA: Diagnosis not present

## 2021-04-25 DIAGNOSIS — E860 Dehydration: Secondary | ICD-10-CM | POA: Diagnosis not present

## 2021-04-25 DIAGNOSIS — J90 Pleural effusion, not elsewhere classified: Secondary | ICD-10-CM | POA: Diagnosis not present

## 2021-04-25 DIAGNOSIS — E871 Hypo-osmolality and hyponatremia: Secondary | ICD-10-CM | POA: Diagnosis not present

## 2021-04-25 DIAGNOSIS — K51011 Ulcerative (chronic) pancolitis with rectal bleeding: Secondary | ICD-10-CM | POA: Diagnosis not present

## 2021-04-25 NOTE — Progress Notes (Signed)
[facility]  [address] [place],  [place]  [address] [phone]  Clinic Day:  5/5//2022  Referring physician: Mateo Flow, MD  HISTORY OF PRESENT ILLNESS:  The patient is a 70 y.o. female with metastatic renal cell carcinoma, proven per a right-sided thoracentesis in early December 2021.  She also had a biopsy of her right renal mass in January 2022, which confirmed clear cell carcinoma.  Unfortunatley, her 3rd cycle of nivolumab/ipilimumab led to her being hospitalized for severe colitis.  This was likely related to the ipilimumab component of her combination immunotherapy.  She was kept on solumedrol throughout her hospitalization to offset the immune-related component of her colitis/diarrhea.  She was discharged 2 weeks ago.  She recalls having diarrhea for a few days later after her discharge.  However, she denies having any diarrhea for the past 12 days.  Of note, CT scans done during her hospitalization showed stable disease, as it pertains to her metastatic renal cell cancer.  She denies having any new symptoms which concern her for overt signs of disease progression.  PHYSICAL EXAM:  Blood pressure (!) 104/57, pulse 84, temperature 98.9 F (37.2 C), resp. rate 16, height 5\' 4"  (1.626 m), weight 128 lb (58.1 kg), SpO2 97 %. Wt Readings from Last 3 Encounters:  04/26/21 128 lb (58.1 kg)  03/22/21 147 lb (66.7 kg)  03/19/21 149 lb 8 oz (67.8 kg)   Body mass index is 21.97 kg/m. Performance status (ECOG): 0 Physical Exam Constitutional:      Appearance: Normal appearance. She is ill-appearing (She has a chronically ill appearance.  She is weaker and thinner vs previous visits).     Comments: She is in a wheelchair, wearing oxygen per nasal canula  HENT:     Mouth/Throat:     Mouth: Mucous membranes are moist.     Pharynx: Oropharynx is clear. No oropharyngeal exudate or posterior oropharyngeal erythema.  Cardiovascular:     Rate and  Rhythm: Normal rate and regular rhythm.     Heart sounds: No murmur heard. No friction rub. No gallop.   Pulmonary:     Effort: Pulmonary effort is normal. No respiratory distress.     Breath sounds: No wheezing, rhonchi or rales.     Comments: Decreased breath sounds in right lung base Chest:  Breasts:     Right: No axillary adenopathy or supraclavicular adenopathy.     Left: No axillary adenopathy or supraclavicular adenopathy.    Abdominal:     General: Bowel sounds are normal. There is no distension.     Palpations: Abdomen is soft. There is no mass.     Tenderness: There is no abdominal tenderness.  Musculoskeletal:        General: No swelling.     Right lower leg: No edema.     Left lower leg: No edema.  Lymphadenopathy:     Cervical: No cervical adenopathy.     Upper Body:     Right upper body: No supraclavicular or axillary adenopathy.     Left upper body: No supraclavicular or axillary adenopathy.     Lower Body: No right inguinal adenopathy. No left inguinal adenopathy.  Skin:    General: Skin is warm.     Coloration: Skin is not jaundiced.     Findings: No lesion or rash.  Neurological:     General: No focal deficit present.     Mental Status: She is alert and oriented to person, place,  and time. Mental status is at baseline.     Cranial Nerves: Cranial nerves are intact.  Psychiatric:        Mood and Affect: Mood normal.        Behavior: Behavior normal.        Thought Content: Thought content normal.    LABS:  Results for GINNY, LOOMER (MRN [id number]) as of 04/26/2021 16:57  Ref. Range 04/26/2021 00:00  Sodium Latest Ref Range: 137 - 147  133 (A)  Potassium Latest Ref Range: 3.4 - 5.3  4.5  Chloride Latest Ref Range: 99 - 108  102  CO2 Latest Ref Range: 13 - 22  27 (A)  Glucose Unknown 162  BUN Latest Ref Range: 4 - 21  6  Creatinine Latest Ref Range: 0.5 - 1.1  0.4 (A)  Calcium Latest Ref Range: 8.7 - 10.7  8.0 (A)  Alkaline Phosphatase Latest Ref  Range: 25 - 125  110  Albumin Latest Ref Range: 3.5 - 5.0  2.8 (A)  AST Latest Ref Range: 13 - 35  45 (A)  ALT Latest Ref Range: 7 - 35  26  Total Protein Latest Ref Range: 6.3 - 8.2 g/dL 5.4 (A)  Bilirubin, Total Unknown 2.8  WBC Unknown 7.4  RBC Latest Ref Range: 3.87 - 5.11  3.24 (A)  Hemoglobin Latest Ref Range: 12.0 - 16.0  10.1 (A)  HCT Latest Ref Range: 36 - 46  31 (A)  MCV Latest Ref Range: 81 - 99  96  Platelets Latest Ref Range: 150 - 399  419 (A)  NEUT# Unknown 5.33    ASSESSMENT & PLAN:  A 70 y.o. female with metastatic renal cell carcinoma.  Despite being weak, the patient does appear better than what she was in the hospital.  However, as she has not gotten back to her baseline, I do want to give her an additional 2 weeks to recuperate.  Of note, she has both physical and occupational therapy working with her to improve her strength and coordination.  I will see her back in 2 weeks for repeat clinical assessment.  If she is doing better, I would restart her on single-agent maintenance nivolumab at that time.  I do feel she could tolerate this immunotherapy agent by itself, particularly as it is not the root cause behind her colitis.  The patient understands all the plans discussed today and is in agreement with them.   Levone Otten Macarthur Critchley, MD

## 2021-04-26 ENCOUNTER — Other Ambulatory Visit: Payer: Self-pay | Admitting: Hematology and Oncology

## 2021-04-26 ENCOUNTER — Inpatient Hospital Stay: Payer: Medicare PPO

## 2021-04-26 ENCOUNTER — Other Ambulatory Visit: Payer: Self-pay | Admitting: Oncology

## 2021-04-26 ENCOUNTER — Inpatient Hospital Stay: Payer: Medicare PPO | Attending: Oncology | Admitting: Oncology

## 2021-04-26 ENCOUNTER — Other Ambulatory Visit: Payer: Self-pay

## 2021-04-26 ENCOUNTER — Telehealth: Payer: Self-pay | Admitting: Oncology

## 2021-04-26 VITALS — BP 104/57 | HR 84 | Temp 98.9°F | Resp 16 | Ht 64.0 in | Wt 128.0 lb

## 2021-04-26 DIAGNOSIS — Z79899 Other long term (current) drug therapy: Secondary | ICD-10-CM | POA: Insufficient documentation

## 2021-04-26 DIAGNOSIS — Z5112 Encounter for antineoplastic immunotherapy: Secondary | ICD-10-CM | POA: Insufficient documentation

## 2021-04-26 DIAGNOSIS — C641 Malignant neoplasm of right kidney, except renal pelvis: Secondary | ICD-10-CM

## 2021-04-26 DIAGNOSIS — D649 Anemia, unspecified: Secondary | ICD-10-CM | POA: Diagnosis not present

## 2021-04-26 DIAGNOSIS — D3001 Benign neoplasm of right kidney: Secondary | ICD-10-CM

## 2021-04-26 LAB — CBC AND DIFFERENTIAL
HCT: 31 — AB (ref 36–46)
Hemoglobin: 10.1 — AB (ref 12.0–16.0)
Neutrophils Absolute: 5.33
Platelets: 419 — AB (ref 150–399)
WBC: 7.4

## 2021-04-26 LAB — BASIC METABOLIC PANEL
BUN: 6 (ref 4–21)
CO2: 27 — AB (ref 13–22)
Chloride: 102 (ref 99–108)
Creatinine: 0.4 — AB (ref 0.5–1.1)
Glucose: 162
Potassium: 4.5 (ref 3.4–5.3)
Sodium: 133 — AB (ref 137–147)

## 2021-04-26 LAB — CBC
MCV: 96 (ref 81–99)
RBC: 3.24 — AB (ref 3.87–5.11)

## 2021-04-26 LAB — HEPATIC FUNCTION PANEL
ALT: 26 (ref 7–35)
AST: 45 — AB (ref 13–35)
Alkaline Phosphatase: 110 (ref 25–125)
Bilirubin, Total: 2.8

## 2021-04-26 LAB — PROTEIN, TOTAL: Total Protein: 5.4 g/dL — AB (ref 6.3–8.2)

## 2021-04-26 LAB — COMPREHENSIVE METABOLIC PANEL
Albumin: 2.8 — AB (ref 3.5–5.0)
Calcium: 8 — AB (ref 8.7–10.7)

## 2021-04-26 LAB — TSH: TSH: 0.946 u[IU]/mL (ref 0.350–4.500)

## 2021-04-26 NOTE — Telephone Encounter (Signed)
Per 5/5 los next appt scheduled and given to patient 

## 2021-04-27 DIAGNOSIS — C641 Malignant neoplasm of right kidney, except renal pelvis: Secondary | ICD-10-CM | POA: Diagnosis not present

## 2021-04-27 DIAGNOSIS — K51011 Ulcerative (chronic) pancolitis with rectal bleeding: Secondary | ICD-10-CM | POA: Diagnosis not present

## 2021-04-27 DIAGNOSIS — J159 Unspecified bacterial pneumonia: Secondary | ICD-10-CM | POA: Diagnosis not present

## 2021-04-27 DIAGNOSIS — J90 Pleural effusion, not elsewhere classified: Secondary | ICD-10-CM | POA: Diagnosis not present

## 2021-04-27 DIAGNOSIS — E871 Hypo-osmolality and hyponatremia: Secondary | ICD-10-CM | POA: Diagnosis not present

## 2021-04-27 DIAGNOSIS — R739 Hyperglycemia, unspecified: Secondary | ICD-10-CM | POA: Diagnosis not present

## 2021-04-27 DIAGNOSIS — E860 Dehydration: Secondary | ICD-10-CM | POA: Diagnosis not present

## 2021-04-27 DIAGNOSIS — C7800 Secondary malignant neoplasm of unspecified lung: Secondary | ICD-10-CM | POA: Diagnosis not present

## 2021-04-27 DIAGNOSIS — I10 Essential (primary) hypertension: Secondary | ICD-10-CM | POA: Diagnosis not present

## 2021-04-27 LAB — T4: T4, Total: 4.7 ug/dL (ref 4.5–12.0)

## 2021-04-30 DIAGNOSIS — C7800 Secondary malignant neoplasm of unspecified lung: Secondary | ICD-10-CM | POA: Diagnosis not present

## 2021-04-30 DIAGNOSIS — C641 Malignant neoplasm of right kidney, except renal pelvis: Secondary | ICD-10-CM | POA: Diagnosis not present

## 2021-04-30 DIAGNOSIS — K51011 Ulcerative (chronic) pancolitis with rectal bleeding: Secondary | ICD-10-CM | POA: Diagnosis not present

## 2021-04-30 DIAGNOSIS — J90 Pleural effusion, not elsewhere classified: Secondary | ICD-10-CM | POA: Diagnosis not present

## 2021-04-30 DIAGNOSIS — J159 Unspecified bacterial pneumonia: Secondary | ICD-10-CM | POA: Diagnosis not present

## 2021-04-30 DIAGNOSIS — E860 Dehydration: Secondary | ICD-10-CM | POA: Diagnosis not present

## 2021-04-30 DIAGNOSIS — R739 Hyperglycemia, unspecified: Secondary | ICD-10-CM | POA: Diagnosis not present

## 2021-04-30 DIAGNOSIS — I10 Essential (primary) hypertension: Secondary | ICD-10-CM | POA: Diagnosis not present

## 2021-04-30 DIAGNOSIS — E871 Hypo-osmolality and hyponatremia: Secondary | ICD-10-CM | POA: Diagnosis not present

## 2021-05-01 DIAGNOSIS — I10 Essential (primary) hypertension: Secondary | ICD-10-CM | POA: Diagnosis not present

## 2021-05-01 DIAGNOSIS — K51011 Ulcerative (chronic) pancolitis with rectal bleeding: Secondary | ICD-10-CM | POA: Diagnosis not present

## 2021-05-01 DIAGNOSIS — J90 Pleural effusion, not elsewhere classified: Secondary | ICD-10-CM | POA: Diagnosis not present

## 2021-05-01 DIAGNOSIS — C641 Malignant neoplasm of right kidney, except renal pelvis: Secondary | ICD-10-CM | POA: Diagnosis not present

## 2021-05-01 DIAGNOSIS — C7800 Secondary malignant neoplasm of unspecified lung: Secondary | ICD-10-CM | POA: Diagnosis not present

## 2021-05-01 DIAGNOSIS — E860 Dehydration: Secondary | ICD-10-CM | POA: Diagnosis not present

## 2021-05-01 DIAGNOSIS — R739 Hyperglycemia, unspecified: Secondary | ICD-10-CM | POA: Diagnosis not present

## 2021-05-01 DIAGNOSIS — J159 Unspecified bacterial pneumonia: Secondary | ICD-10-CM | POA: Diagnosis not present

## 2021-05-01 DIAGNOSIS — E871 Hypo-osmolality and hyponatremia: Secondary | ICD-10-CM | POA: Diagnosis not present

## 2021-05-02 DIAGNOSIS — J159 Unspecified bacterial pneumonia: Secondary | ICD-10-CM | POA: Diagnosis not present

## 2021-05-02 DIAGNOSIS — C641 Malignant neoplasm of right kidney, except renal pelvis: Secondary | ICD-10-CM | POA: Diagnosis not present

## 2021-05-02 DIAGNOSIS — E871 Hypo-osmolality and hyponatremia: Secondary | ICD-10-CM | POA: Diagnosis not present

## 2021-05-02 DIAGNOSIS — E860 Dehydration: Secondary | ICD-10-CM | POA: Diagnosis not present

## 2021-05-02 DIAGNOSIS — I10 Essential (primary) hypertension: Secondary | ICD-10-CM | POA: Diagnosis not present

## 2021-05-02 DIAGNOSIS — R739 Hyperglycemia, unspecified: Secondary | ICD-10-CM | POA: Diagnosis not present

## 2021-05-02 DIAGNOSIS — C7800 Secondary malignant neoplasm of unspecified lung: Secondary | ICD-10-CM | POA: Diagnosis not present

## 2021-05-02 DIAGNOSIS — K51011 Ulcerative (chronic) pancolitis with rectal bleeding: Secondary | ICD-10-CM | POA: Diagnosis not present

## 2021-05-02 DIAGNOSIS — J90 Pleural effusion, not elsewhere classified: Secondary | ICD-10-CM | POA: Diagnosis not present

## 2021-05-03 DIAGNOSIS — E871 Hypo-osmolality and hyponatremia: Secondary | ICD-10-CM | POA: Diagnosis not present

## 2021-05-03 DIAGNOSIS — C7800 Secondary malignant neoplasm of unspecified lung: Secondary | ICD-10-CM | POA: Diagnosis not present

## 2021-05-03 DIAGNOSIS — K51011 Ulcerative (chronic) pancolitis with rectal bleeding: Secondary | ICD-10-CM | POA: Diagnosis not present

## 2021-05-03 DIAGNOSIS — J90 Pleural effusion, not elsewhere classified: Secondary | ICD-10-CM | POA: Diagnosis not present

## 2021-05-03 DIAGNOSIS — J159 Unspecified bacterial pneumonia: Secondary | ICD-10-CM | POA: Diagnosis not present

## 2021-05-03 DIAGNOSIS — C641 Malignant neoplasm of right kidney, except renal pelvis: Secondary | ICD-10-CM | POA: Diagnosis not present

## 2021-05-03 DIAGNOSIS — E860 Dehydration: Secondary | ICD-10-CM | POA: Diagnosis not present

## 2021-05-03 DIAGNOSIS — I10 Essential (primary) hypertension: Secondary | ICD-10-CM | POA: Diagnosis not present

## 2021-05-03 DIAGNOSIS — R739 Hyperglycemia, unspecified: Secondary | ICD-10-CM | POA: Diagnosis not present

## 2021-05-07 DIAGNOSIS — C641 Malignant neoplasm of right kidney, except renal pelvis: Secondary | ICD-10-CM | POA: Diagnosis not present

## 2021-05-07 DIAGNOSIS — E860 Dehydration: Secondary | ICD-10-CM | POA: Diagnosis not present

## 2021-05-07 DIAGNOSIS — E871 Hypo-osmolality and hyponatremia: Secondary | ICD-10-CM | POA: Diagnosis not present

## 2021-05-07 DIAGNOSIS — I10 Essential (primary) hypertension: Secondary | ICD-10-CM | POA: Diagnosis not present

## 2021-05-07 DIAGNOSIS — K51011 Ulcerative (chronic) pancolitis with rectal bleeding: Secondary | ICD-10-CM | POA: Diagnosis not present

## 2021-05-07 DIAGNOSIS — R739 Hyperglycemia, unspecified: Secondary | ICD-10-CM | POA: Diagnosis not present

## 2021-05-07 DIAGNOSIS — C7800 Secondary malignant neoplasm of unspecified lung: Secondary | ICD-10-CM | POA: Diagnosis not present

## 2021-05-07 DIAGNOSIS — J159 Unspecified bacterial pneumonia: Secondary | ICD-10-CM | POA: Diagnosis not present

## 2021-05-07 DIAGNOSIS — J90 Pleural effusion, not elsewhere classified: Secondary | ICD-10-CM | POA: Diagnosis not present

## 2021-05-07 NOTE — Progress Notes (Signed)
[facility]  [address] [place],  [place]  [address] [phone]  Clinic Day:  05/09/2021  Referring physician: Mateo Flow, MD  This document serves as a record of services personally performed by Dequincy Macarthur Critchley, MD. It was created on their behalf by [facility], a trained medical scribe. The creation of this record is based on the scribe's personal observations and the provider's statements to them.  HISTORY OF PRESENT ILLNESS:  The patient is a 70 y.o. female with metastatic renal cell carcinoma, proven per a right-sided thoracentesis in early December 2021.  She also had a biopsy of her right renal mass in January 2022, which confirmed clear cell carcinoma.  He received 3 cycles of nivolumab/ipilimumab before being hospitalized for severe colitis.  This was likely related to the ipilimumab component of her combination immunotherapy.  She was kept on solumedrol throughout her hospitalization to offset the immune-related component of her colitis/diarrhea.  She comes in to be reassessed to determine if she is ready to proceed with additional immunotherapy. Since her last visit, the patient has gotten much stronger.  She feels she is essentially back to her previous baseline to where she is comfortable getting her immunotherapy restarted.  She denies having any new symptoms which concern her for overt signs of disease progression.  PHYSICAL EXAM:  Blood pressure (!) 145/62, pulse 80, temperature 98.9 F (37.2 C), resp. rate 14, height 5\' 4"  (1.626 m), weight 133 lb 11.2 oz (60.6 kg), SpO2 96 %. Wt Readings from Last 3 Encounters:  05/09/21 133 lb 11.2 oz (60.6 kg)  04/26/21 128 lb (58.1 kg)  03/22/21 147 lb (66.7 kg)   Body mass index is 22.95 kg/m. Performance status (ECOG): 0 Physical Exam Constitutional:      Appearance: Normal appearance. She is not ill-appearing (She looks much better vs previous visits).     Comments: She ambulates while  using a cane   HENT:     Mouth/Throat:     Mouth: Mucous membranes are moist.     Pharynx: Oropharynx is clear. No oropharyngeal exudate or posterior oropharyngeal erythema.  Cardiovascular:     Rate and Rhythm: Normal rate and regular rhythm.     Heart sounds: No murmur heard. No friction rub. No gallop.   Pulmonary:     Effort: Pulmonary effort is normal. No respiratory distress.     Breath sounds: No wheezing, rhonchi or rales.     Comments: Decreased breath sounds in right lung base Chest:  Breasts:     Right: No axillary adenopathy or supraclavicular adenopathy.     Left: No axillary adenopathy or supraclavicular adenopathy.    Abdominal:     General: Bowel sounds are normal. There is no distension.     Palpations: Abdomen is soft. There is no mass.     Tenderness: There is no abdominal tenderness.  Musculoskeletal:        General: No swelling.     Right lower leg: No edema.     Left lower leg: No edema.  Lymphadenopathy:     Cervical: No cervical adenopathy.     Upper Body:     Right upper body: No supraclavicular or axillary adenopathy.     Left upper body: No supraclavicular or axillary adenopathy.     Lower Body: No right inguinal adenopathy. No left inguinal adenopathy.  Skin:    General: Skin is warm.     Coloration: Skin is not jaundiced.  Findings: No lesion or rash.  Neurological:     General: No focal deficit present.     Mental Status: She is alert and oriented to person, place, and time. Mental status is at baseline.     Cranial Nerves: Cranial nerves are intact.  Psychiatric:        Mood and Affect: Mood normal.        Behavior: Behavior normal.        Thought Content: Thought content normal.    LABS:    Ref. Range 05/09/2021 00:00  Sodium Latest Ref Range: 137 - 147  137  Potassium Latest Ref Range: 3.4 - 5.3  3.9  Chloride Latest Ref Range: 99 - 108  107  CO2 Latest Ref Range: 13 - 22  25 (A)  Glucose Unknown 117  BUN Latest Ref Range: 4 -  21  7  Creatinine Latest Ref Range: 0.5 - 1.1  0.5  Calcium Latest Ref Range: 8.7 - 10.7  8.2 (A)  Alkaline Phosphatase Latest Ref Range: 25 - 125  107  Albumin Latest Ref Range: 3.5 - 5.0  3.5  AST Latest Ref Range: 13 - 35  38 (A)  ALT Latest Ref Range: 7 - 35  35  Bilirubin, Total Unknown 0.4  WBC Unknown 5.9  RBC Latest Ref Range: 3.87 - 5.11  3.46 (A)  Hemoglobin Latest Ref Range: 12.0 - 16.0  11.1 (A)  HCT Latest Ref Range: 36 - 46  35 (A)  Platelets Latest Ref Range: 150 - 399  255  NEUT# Unknown 3.60    ASSESSMENT & PLAN:  A 70 y.o. female with metastatic renal cell carcinoma.  The patient looks significantly better than what she did 2 weeks ago to where I do feel comfortable getting her back on immunotherapy.  She will proceed with her 1st cycle of maintenance nivolumab tomorrow, which she will receive once every 4 weeks.  I strongly encouraged her to contact our office over these next few weeks if she runs into any problems that require immediate clinical attention.  Otherwise, I will see her back in 4 weeks before she heads into her 2nd cycle of nivolumab.  The patient understands all the plans discussed today and is in agreement with them.    I, Rita Ohara, am acting as scribe for Marice Potter, MD    I have reviewed this report as typed by the medical scribe, and it is complete and accurate.  Dequincy Macarthur Critchley, MD

## 2021-05-09 ENCOUNTER — Other Ambulatory Visit: Payer: Self-pay | Admitting: Oncology

## 2021-05-09 ENCOUNTER — Encounter: Payer: Self-pay | Admitting: Oncology

## 2021-05-09 ENCOUNTER — Telehealth: Payer: Self-pay | Admitting: Oncology

## 2021-05-09 ENCOUNTER — Inpatient Hospital Stay: Payer: Medicare PPO | Admitting: Oncology

## 2021-05-09 ENCOUNTER — Inpatient Hospital Stay: Payer: Medicare PPO

## 2021-05-09 ENCOUNTER — Other Ambulatory Visit: Payer: Self-pay

## 2021-05-09 VITALS — BP 145/62 | HR 80 | Temp 98.9°F | Resp 14 | Ht 64.0 in | Wt 133.7 lb

## 2021-05-09 DIAGNOSIS — J159 Unspecified bacterial pneumonia: Secondary | ICD-10-CM | POA: Diagnosis not present

## 2021-05-09 DIAGNOSIS — D649 Anemia, unspecified: Secondary | ICD-10-CM | POA: Diagnosis not present

## 2021-05-09 DIAGNOSIS — C649 Malignant neoplasm of unspecified kidney, except renal pelvis: Secondary | ICD-10-CM | POA: Diagnosis not present

## 2021-05-09 DIAGNOSIS — J90 Pleural effusion, not elsewhere classified: Secondary | ICD-10-CM | POA: Diagnosis not present

## 2021-05-09 DIAGNOSIS — C641 Malignant neoplasm of right kidney, except renal pelvis: Secondary | ICD-10-CM

## 2021-05-09 DIAGNOSIS — Z79899 Other long term (current) drug therapy: Secondary | ICD-10-CM | POA: Diagnosis not present

## 2021-05-09 DIAGNOSIS — C7800 Secondary malignant neoplasm of unspecified lung: Secondary | ICD-10-CM | POA: Diagnosis not present

## 2021-05-09 DIAGNOSIS — I10 Essential (primary) hypertension: Secondary | ICD-10-CM | POA: Diagnosis not present

## 2021-05-09 DIAGNOSIS — D3001 Benign neoplasm of right kidney: Secondary | ICD-10-CM

## 2021-05-09 DIAGNOSIS — E860 Dehydration: Secondary | ICD-10-CM | POA: Diagnosis not present

## 2021-05-09 DIAGNOSIS — R739 Hyperglycemia, unspecified: Secondary | ICD-10-CM | POA: Diagnosis not present

## 2021-05-09 DIAGNOSIS — E871 Hypo-osmolality and hyponatremia: Secondary | ICD-10-CM | POA: Diagnosis not present

## 2021-05-09 DIAGNOSIS — K51011 Ulcerative (chronic) pancolitis with rectal bleeding: Secondary | ICD-10-CM | POA: Diagnosis not present

## 2021-05-09 DIAGNOSIS — Z5112 Encounter for antineoplastic immunotherapy: Secondary | ICD-10-CM | POA: Diagnosis not present

## 2021-05-09 LAB — TSH: TSH: 1.978 u[IU]/mL (ref 0.350–4.500)

## 2021-05-09 LAB — CBC AND DIFFERENTIAL
HCT: 35 — AB (ref 36–46)
Hemoglobin: 11.1 — AB (ref 12.0–16.0)
Neutrophils Absolute: 3.6
Platelets: 255 (ref 150–399)
WBC: 5.9

## 2021-05-09 LAB — COMPREHENSIVE METABOLIC PANEL
Albumin: 3.5 (ref 3.5–5.0)
Calcium: 8.2 — AB (ref 8.7–10.7)

## 2021-05-09 LAB — BASIC METABOLIC PANEL
BUN: 7 (ref 4–21)
CO2: 25 — AB (ref 13–22)
Chloride: 107 (ref 99–108)
Creatinine: 0.5 (ref 0.5–1.1)
Glucose: 117
Potassium: 3.9 (ref 3.4–5.3)
Sodium: 137 (ref 137–147)

## 2021-05-09 LAB — HEPATIC FUNCTION PANEL
ALT: 35 (ref 7–35)
AST: 38 — AB (ref 13–35)
Alkaline Phosphatase: 107 (ref 25–125)
Bilirubin, Total: 0.4

## 2021-05-09 LAB — CBC: RBC: 3.46 — AB (ref 3.87–5.11)

## 2021-05-09 NOTE — Telephone Encounter (Signed)
Per 5/18 LOS, patient scheduled for June Appt's.  Gave patient Appt Summary

## 2021-05-10 ENCOUNTER — Inpatient Hospital Stay: Payer: Medicare PPO

## 2021-05-10 ENCOUNTER — Ambulatory Visit: Payer: Medicare PPO | Admitting: Oncology

## 2021-05-10 ENCOUNTER — Other Ambulatory Visit: Payer: Medicare PPO

## 2021-05-10 VITALS — BP 114/60 | HR 89 | Temp 98.5°F | Resp 18 | Wt 121.4 lb

## 2021-05-10 DIAGNOSIS — Z5112 Encounter for antineoplastic immunotherapy: Secondary | ICD-10-CM | POA: Diagnosis not present

## 2021-05-10 DIAGNOSIS — C641 Malignant neoplasm of right kidney, except renal pelvis: Secondary | ICD-10-CM | POA: Diagnosis not present

## 2021-05-10 DIAGNOSIS — Z79899 Other long term (current) drug therapy: Secondary | ICD-10-CM | POA: Diagnosis not present

## 2021-05-10 DIAGNOSIS — D3001 Benign neoplasm of right kidney: Secondary | ICD-10-CM

## 2021-05-10 LAB — T4: T4, Total: 4.7 ug/dL (ref 4.5–12.0)

## 2021-05-10 MED ORDER — HEPARIN SOD (PORK) LOCK FLUSH 100 UNIT/ML IV SOLN
500.0000 [IU] | Freq: Once | INTRAVENOUS | Status: AC | PRN
  Administered 2021-05-10: 500 [IU]
  Filled 2021-05-10: qty 5

## 2021-05-10 MED ORDER — SODIUM CHLORIDE 0.9 % IV SOLN
480.0000 mg | Freq: Once | INTRAVENOUS | Status: AC
  Administered 2021-05-10: 480 mg via INTRAVENOUS
  Filled 2021-05-10: qty 48

## 2021-05-10 MED ORDER — SODIUM CHLORIDE 0.9 % IV SOLN
Freq: Once | INTRAVENOUS | Status: AC
  Filled 2021-05-10: qty 250

## 2021-05-10 NOTE — Patient Instructions (Signed)
[facility]  Discharge Instructions: Thank you for choosing [facility] to provide your oncology and hematology care.  If you have a lab appointment with the [facility], please go directly to the [facility] and check in at the registration area.   Wear comfortable clothing and clothing appropriate for easy access to any Portacath or PICC line.   We strive to give you quality time with your provider. You may need to reschedule your appointment if you arrive late (15 or more minutes).  Arriving late affects you and other patients whose appointments are after yours.  Also, if you miss three or more appointments without notifying the office, you may be dismissed from the clinic at the provider's discretion.      For prescription refill requests, have your pharmacy contact our office and allow 72 hours for refills to be completed.    Today you received the following chemotherapy and/or immunotherapy agents Opdivo      To help prevent nausea and vomiting after your treatment, we encourage you to take your nausea medication as directed.  BELOW ARE SYMPTOMS THAT SHOULD BE REPORTED IMMEDIATELY: . *FEVER GREATER THAN 100.4 F (38 C) OR HIGHER . *CHILLS OR SWEATING . *NAUSEA AND VOMITING THAT IS NOT CONTROLLED WITH YOUR NAUSEA MEDICATION . *UNUSUAL SHORTNESS OF BREATH . *UNUSUAL BRUISING OR BLEEDING . *URINARY PROBLEMS (pain or burning when urinating, or frequent urination) . *BOWEL PROBLEMS (unusual diarrhea, constipation, pain near the anus) . TENDERNESS IN MOUTH AND THROAT WITH OR WITHOUT PRESENCE OF ULCERS (sore throat, sores in mouth, or a toothache) . UNUSUAL RASH, SWELLING OR PAIN  . UNUSUAL VAGINAL DISCHARGE OR ITCHING   Items with * indicate a potential emergency and should be followed up as soon as possible or go to the Emergency Department if any problems should occur.  Please show the CHEMOTHERAPY ALERT CARD or IMMUNOTHERAPY ALERT CARD at  check-in to the Emergency Department and triage nurse.  Should you have questions after your visit or need to cancel or reschedule your appointment, please contact [facility]  Dept: [phone]  and follow the prompts.  Office hours are 8:00 a.m. to 4:30 p.m. Monday - Friday. Please note that voicemails left after 4:00 p.m. may not be returned until the following business day.  We are closed weekends and major holidays. You have access to a nurse at all times for urgent questions. Please call the main number to the clinic Dept: [phone] and follow the prompts.  For any non-urgent questions, you may also contact your provider using MyChart. We now offer e-Visits for anyone 74 and older to request care online for non-urgent symptoms. For details visit [email or web address].   Also download the MyChart app! Go to the app store, search "MyChart", open the app, select [facility], and log in with your MyChart username and password.  Due to Covid, a mask is required upon entering the hospital/clinic. If you do not have a mask, one will be given to you upon arrival. For doctor visits, patients may have 1 support person aged 42 or older with them. For treatment visits, patients cannot have anyone with them due to current Covid guidelines and our immunocompromised population.   Nivolumab injection What is this medicine? NIVOLUMAB (nye VOL ue mab) is a monoclonal antibody. It treats certain types of cancer. Some of the cancers treated are colon cancer, head and neck cancer, Hodgkin lymphoma, lung cancer, and melanoma. This medicine  may be used for other purposes; ask your health care provider or pharmacist if you have questions. COMMON BRAND NAME(S): Opdivo What should I tell my health care provider before I take this medicine? They need to know if you have any of these conditions:  autoimmune diseases like Crohn's disease, ulcerative colitis, or lupus  have had or planning  to have an allogeneic stem cell transplant (uses someone else's stem cells)  history of chest radiation  history of organ transplant  nervous system problems like myasthenia gravis or Guillain-Barre syndrome  an unusual or allergic reaction to nivolumab, other medicines, foods, dyes, or preservatives  pregnant or trying to get pregnant  breast-feeding How should I use this medicine? This medicine is for infusion into a vein. It is given by a health care professional in a hospital or clinic setting. A special MedGuide will be given to you before each treatment. Be sure to read this information carefully each time. Talk to your pediatrician regarding the use of this medicine in children. While this drug may be prescribed for children as young as 12 years for selected conditions, precautions do apply. Overdosage: If you think you have taken too much of this medicine contact a poison control center or emergency room at once. NOTE: This medicine is only for you. Do not share this medicine with others. What if I miss a dose? It is important not to miss your dose. Call your doctor or health care professional if you are unable to keep an appointment. What may interact with this medicine? Interactions have not been studied. This list may not describe all possible interactions. Give your health care provider a list of all the medicines, herbs, non-prescription drugs, or dietary supplements you use. Also tell them if you smoke, drink alcohol, or use illegal drugs. Some items may interact with your medicine. What should I watch for while using this medicine? This drug may make you feel generally unwell. Continue your course of treatment even though you feel ill unless your doctor tells you to stop. You may need blood work done while you are taking this medicine. Do not become pregnant while taking this medicine or for 5 months after stopping it. Women should inform their doctor if they wish to become  pregnant or think they might be pregnant. There is a potential for serious side effects to an unborn child. Talk to your health care professional or pharmacist for more information. Do not breast-feed an infant while taking this medicine or for 5 months after stopping it. What side effects may I notice from receiving this medicine? Side effects that you should report to your doctor or health care professional as soon as possible:  allergic reactions like skin rash, itching or hives, swelling of the face, lips, or tongue  breathing problems  blood in the urine  bloody or watery diarrhea or black, tarry stools  changes in emotions or moods  changes in vision  chest pain  cough  dizziness  feeling faint or lightheaded, falls  fever, chills  headache with fever, neck stiffness, confusion, loss of memory, sensitivity to light, hallucination, loss of contact with reality, or seizures  joint pain  mouth sores  redness, blistering, peeling or loosening of the skin, including inside the mouth  severe muscle pain or weakness  signs and symptoms of high blood sugar such as dizziness; dry mouth; dry skin; fruity breath; nausea; stomach pain; increased hunger or thirst; increased urination  signs and symptoms of kidney injury  like trouble passing urine or change in the amount of urine  signs and symptoms of liver injury like dark yellow or brown urine; general ill feeling or flu-like symptoms; light-colored stools; loss of appetite; nausea; right upper belly pain; unusually weak or tired; yellowing of the eyes or skin  swelling of the ankles, feet, hands  trouble passing urine or change in the amount of urine  unusually weak or tired  weight gain or loss Side effects that usually do not require medical attention (report to your doctor or health care professional if they continue or are bothersome):  bone pain  constipation  decreased appetite  diarrhea  muscle  pain  nausea, vomiting  tiredness This list may not describe all possible side effects. Call your doctor for medical advice about side effects. You may report side effects to FDA at 1-800-FDA-1088. Where should I keep my medicine? This drug is given in a hospital or clinic and will not be stored at home. NOTE: This sheet is a summary. It may not cover all possible information. If you have questions about this medicine, talk to your doctor, pharmacist, or health care provider.  2021 Elsevier/Gold Standard (2020-04-12 10:08:25)

## 2021-05-10 NOTE — Progress Notes (Signed)
Pt d/c stab;e at 1105

## 2021-05-11 ENCOUNTER — Telehealth: Payer: Self-pay

## 2021-05-11 NOTE — Telephone Encounter (Signed)
Spoke with pt, she did well last night. She restarted the Nivolumab yesterday.  She denies fevers, N/V, diarrhea, and skin reactions. I reminded pt of the importance of calling us with temp of 100.4 or higher, DAY OR NIGHT @ [phone]. Pt verbalized understanding.

## 2021-05-13 DIAGNOSIS — J90 Pleural effusion, not elsewhere classified: Secondary | ICD-10-CM | POA: Diagnosis not present

## 2021-05-13 DIAGNOSIS — C641 Malignant neoplasm of right kidney, except renal pelvis: Secondary | ICD-10-CM | POA: Diagnosis not present

## 2021-05-14 DIAGNOSIS — E860 Dehydration: Secondary | ICD-10-CM | POA: Diagnosis not present

## 2021-05-14 DIAGNOSIS — R739 Hyperglycemia, unspecified: Secondary | ICD-10-CM | POA: Diagnosis not present

## 2021-05-14 DIAGNOSIS — I10 Essential (primary) hypertension: Secondary | ICD-10-CM | POA: Diagnosis not present

## 2021-05-14 DIAGNOSIS — E871 Hypo-osmolality and hyponatremia: Secondary | ICD-10-CM | POA: Diagnosis not present

## 2021-05-14 DIAGNOSIS — C641 Malignant neoplasm of right kidney, except renal pelvis: Secondary | ICD-10-CM | POA: Diagnosis not present

## 2021-05-14 DIAGNOSIS — J159 Unspecified bacterial pneumonia: Secondary | ICD-10-CM | POA: Diagnosis not present

## 2021-05-14 DIAGNOSIS — K51011 Ulcerative (chronic) pancolitis with rectal bleeding: Secondary | ICD-10-CM | POA: Diagnosis not present

## 2021-05-14 DIAGNOSIS — J90 Pleural effusion, not elsewhere classified: Secondary | ICD-10-CM | POA: Diagnosis not present

## 2021-05-14 DIAGNOSIS — C7800 Secondary malignant neoplasm of unspecified lung: Secondary | ICD-10-CM | POA: Diagnosis not present

## 2021-05-15 IMAGING — DX DG CHEST 1V
1 series · 1 of 1 positions shown · non-contrast
Comparison: Single view 0066 hours compared to 1618 hours

CLINICAL DATA: Post thoracentesis

EXAM:
CHEST  1 VIEW

[chest pa]
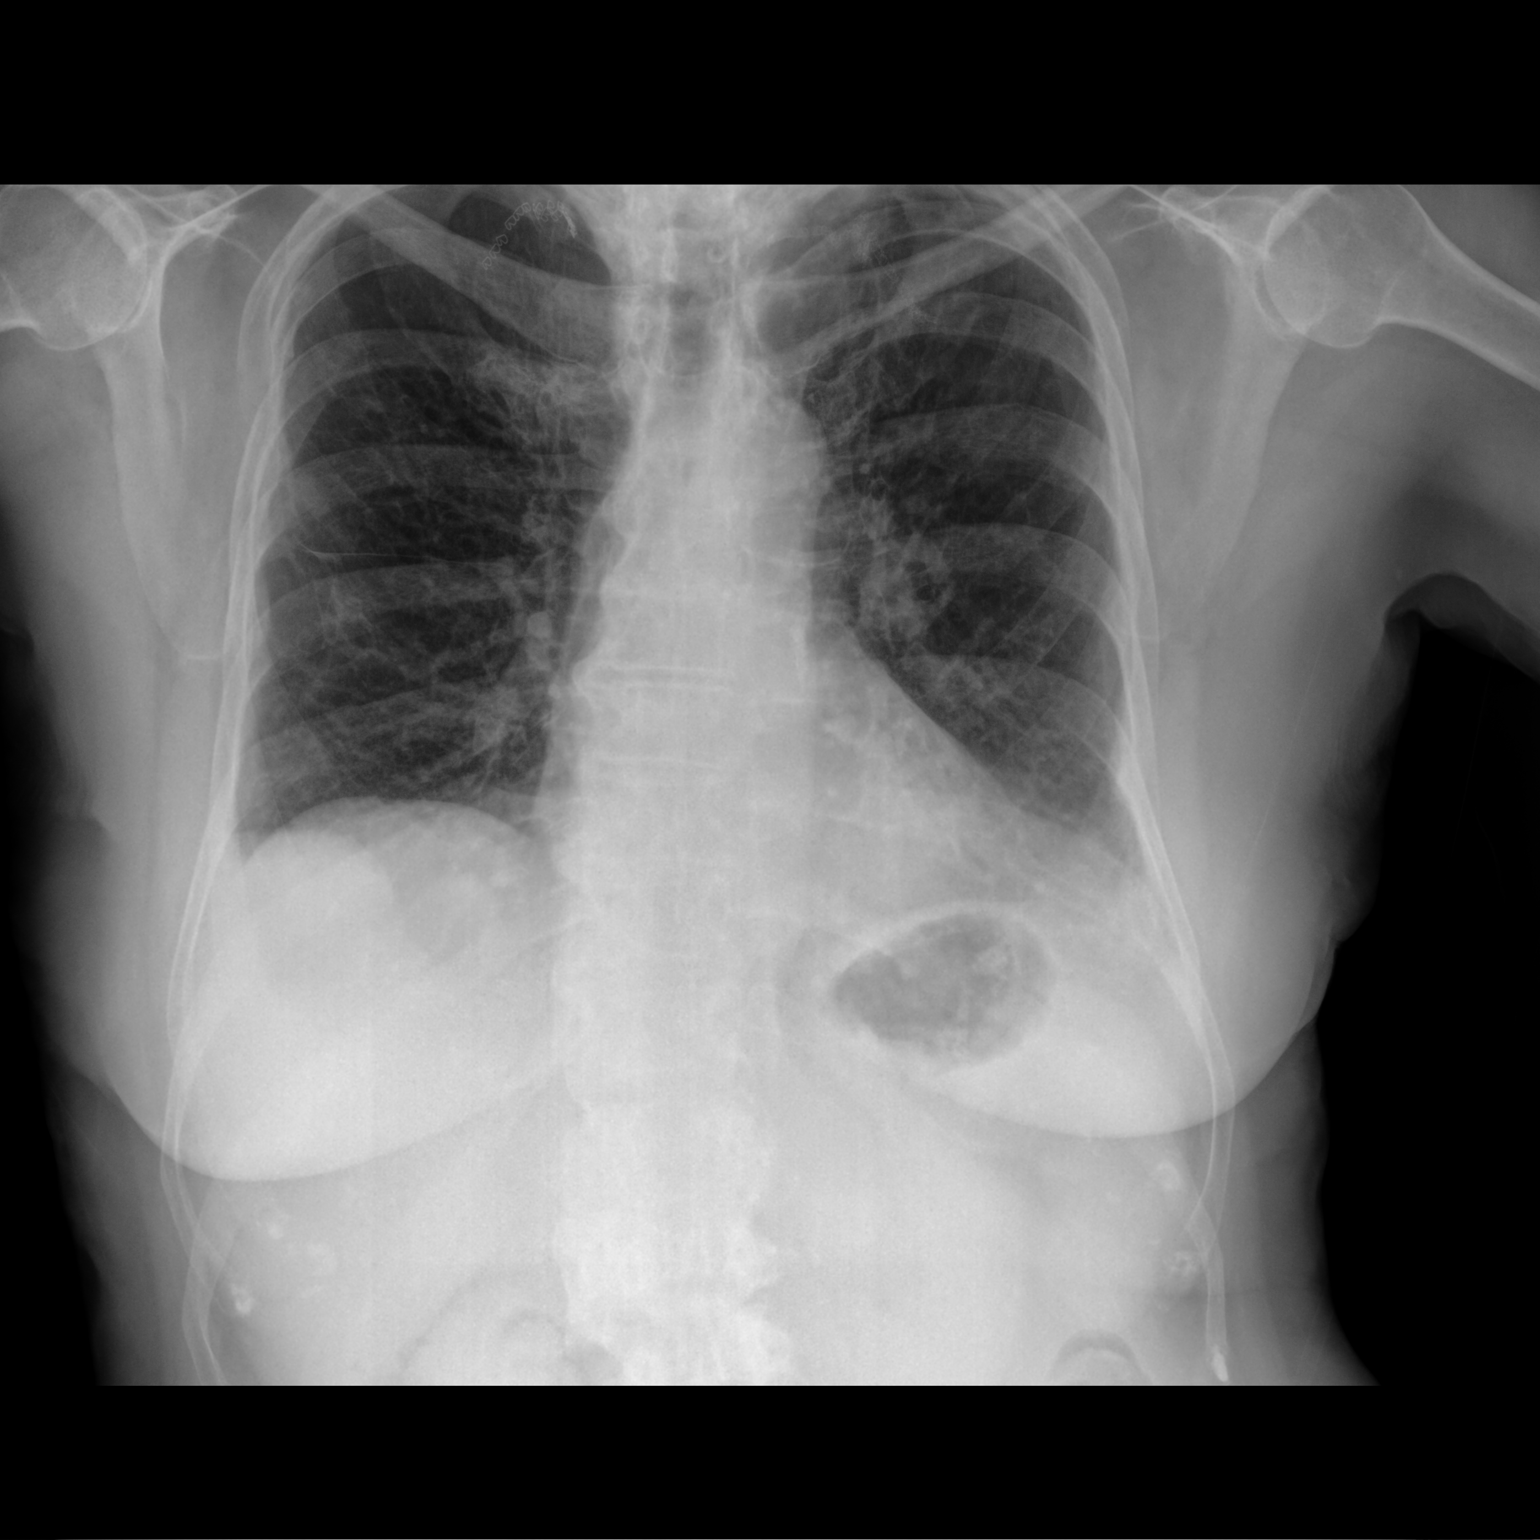

[1 of 1 positions shown; findings below may reference images not displayed]

FINDINGS: Decrease in RIGHT pleural effusion and basilar atelectasis post
thoracentesis.

No pneumothorax.

Stable heart size and mediastinal contours.

Bronchitic changes with RIGHT mid lung scarring.

Postsurgical changes at both lung apices.

No acute infiltrate.

Bones demineralized with degenerative disc disease changes thoracic
spine.
IMPRESSION: Decreased RIGHT pleural effusion and basilar atelectasis post
thoracentesis.

No pneumothorax.

Persistent bronchitic and postsurgical changes.

## 2021-05-16 DIAGNOSIS — K51011 Ulcerative (chronic) pancolitis with rectal bleeding: Secondary | ICD-10-CM | POA: Diagnosis not present

## 2021-05-18 DIAGNOSIS — C641 Malignant neoplasm of right kidney, except renal pelvis: Secondary | ICD-10-CM | POA: Diagnosis not present

## 2021-05-18 DIAGNOSIS — C7800 Secondary malignant neoplasm of unspecified lung: Secondary | ICD-10-CM | POA: Diagnosis not present

## 2021-05-18 DIAGNOSIS — I10 Essential (primary) hypertension: Secondary | ICD-10-CM | POA: Diagnosis not present

## 2021-05-18 DIAGNOSIS — E860 Dehydration: Secondary | ICD-10-CM | POA: Diagnosis not present

## 2021-05-18 DIAGNOSIS — K51011 Ulcerative (chronic) pancolitis with rectal bleeding: Secondary | ICD-10-CM | POA: Diagnosis not present

## 2021-05-18 DIAGNOSIS — J159 Unspecified bacterial pneumonia: Secondary | ICD-10-CM | POA: Diagnosis not present

## 2021-05-18 DIAGNOSIS — R739 Hyperglycemia, unspecified: Secondary | ICD-10-CM | POA: Diagnosis not present

## 2021-05-18 DIAGNOSIS — E871 Hypo-osmolality and hyponatremia: Secondary | ICD-10-CM | POA: Diagnosis not present

## 2021-05-18 DIAGNOSIS — J90 Pleural effusion, not elsewhere classified: Secondary | ICD-10-CM | POA: Diagnosis not present

## 2021-05-22 DIAGNOSIS — R739 Hyperglycemia, unspecified: Secondary | ICD-10-CM | POA: Diagnosis not present

## 2021-05-22 DIAGNOSIS — J159 Unspecified bacterial pneumonia: Secondary | ICD-10-CM | POA: Diagnosis not present

## 2021-05-22 DIAGNOSIS — C641 Malignant neoplasm of right kidney, except renal pelvis: Secondary | ICD-10-CM | POA: Diagnosis not present

## 2021-05-22 DIAGNOSIS — I10 Essential (primary) hypertension: Secondary | ICD-10-CM | POA: Diagnosis not present

## 2021-05-22 DIAGNOSIS — K51011 Ulcerative (chronic) pancolitis with rectal bleeding: Secondary | ICD-10-CM | POA: Diagnosis not present

## 2021-05-22 DIAGNOSIS — C7800 Secondary malignant neoplasm of unspecified lung: Secondary | ICD-10-CM | POA: Diagnosis not present

## 2021-05-22 DIAGNOSIS — E871 Hypo-osmolality and hyponatremia: Secondary | ICD-10-CM | POA: Diagnosis not present

## 2021-05-22 DIAGNOSIS — E860 Dehydration: Secondary | ICD-10-CM | POA: Diagnosis not present

## 2021-05-22 DIAGNOSIS — J90 Pleural effusion, not elsewhere classified: Secondary | ICD-10-CM | POA: Diagnosis not present

## 2021-05-23 DIAGNOSIS — C641 Malignant neoplasm of right kidney, except renal pelvis: Secondary | ICD-10-CM | POA: Diagnosis not present

## 2021-05-23 DIAGNOSIS — E860 Dehydration: Secondary | ICD-10-CM | POA: Diagnosis not present

## 2021-05-23 DIAGNOSIS — R739 Hyperglycemia, unspecified: Secondary | ICD-10-CM | POA: Diagnosis not present

## 2021-05-23 DIAGNOSIS — I10 Essential (primary) hypertension: Secondary | ICD-10-CM | POA: Diagnosis not present

## 2021-05-23 DIAGNOSIS — C7800 Secondary malignant neoplasm of unspecified lung: Secondary | ICD-10-CM | POA: Diagnosis not present

## 2021-05-23 DIAGNOSIS — J159 Unspecified bacterial pneumonia: Secondary | ICD-10-CM | POA: Diagnosis not present

## 2021-05-23 DIAGNOSIS — J90 Pleural effusion, not elsewhere classified: Secondary | ICD-10-CM | POA: Diagnosis not present

## 2021-05-23 DIAGNOSIS — E871 Hypo-osmolality and hyponatremia: Secondary | ICD-10-CM | POA: Diagnosis not present

## 2021-05-23 DIAGNOSIS — K51011 Ulcerative (chronic) pancolitis with rectal bleeding: Secondary | ICD-10-CM | POA: Diagnosis not present

## 2021-05-24 DIAGNOSIS — H903 Sensorineural hearing loss, bilateral: Secondary | ICD-10-CM | POA: Diagnosis not present

## 2021-05-28 DIAGNOSIS — J159 Unspecified bacterial pneumonia: Secondary | ICD-10-CM | POA: Diagnosis not present

## 2021-05-28 DIAGNOSIS — E860 Dehydration: Secondary | ICD-10-CM | POA: Diagnosis not present

## 2021-05-28 DIAGNOSIS — E871 Hypo-osmolality and hyponatremia: Secondary | ICD-10-CM | POA: Diagnosis not present

## 2021-05-28 DIAGNOSIS — C7800 Secondary malignant neoplasm of unspecified lung: Secondary | ICD-10-CM | POA: Diagnosis not present

## 2021-05-28 DIAGNOSIS — R739 Hyperglycemia, unspecified: Secondary | ICD-10-CM | POA: Diagnosis not present

## 2021-05-28 DIAGNOSIS — K51011 Ulcerative (chronic) pancolitis with rectal bleeding: Secondary | ICD-10-CM | POA: Diagnosis not present

## 2021-05-28 DIAGNOSIS — C641 Malignant neoplasm of right kidney, except renal pelvis: Secondary | ICD-10-CM | POA: Diagnosis not present

## 2021-05-28 DIAGNOSIS — J90 Pleural effusion, not elsewhere classified: Secondary | ICD-10-CM | POA: Diagnosis not present

## 2021-05-28 DIAGNOSIS — I10 Essential (primary) hypertension: Secondary | ICD-10-CM | POA: Diagnosis not present

## 2021-06-04 DIAGNOSIS — K51011 Ulcerative (chronic) pancolitis with rectal bleeding: Secondary | ICD-10-CM | POA: Diagnosis not present

## 2021-06-04 DIAGNOSIS — J159 Unspecified bacterial pneumonia: Secondary | ICD-10-CM | POA: Diagnosis not present

## 2021-06-04 DIAGNOSIS — R739 Hyperglycemia, unspecified: Secondary | ICD-10-CM | POA: Diagnosis not present

## 2021-06-04 DIAGNOSIS — E860 Dehydration: Secondary | ICD-10-CM | POA: Diagnosis not present

## 2021-06-04 DIAGNOSIS — C641 Malignant neoplasm of right kidney, except renal pelvis: Secondary | ICD-10-CM | POA: Diagnosis not present

## 2021-06-04 DIAGNOSIS — I10 Essential (primary) hypertension: Secondary | ICD-10-CM | POA: Diagnosis not present

## 2021-06-04 DIAGNOSIS — J90 Pleural effusion, not elsewhere classified: Secondary | ICD-10-CM | POA: Diagnosis not present

## 2021-06-04 DIAGNOSIS — C7800 Secondary malignant neoplasm of unspecified lung: Secondary | ICD-10-CM | POA: Diagnosis not present

## 2021-06-04 DIAGNOSIS — E871 Hypo-osmolality and hyponatremia: Secondary | ICD-10-CM | POA: Diagnosis not present

## 2021-06-04 NOTE — Progress Notes (Signed)
[facility]  [address] [place],  [place]  [address] [phone]  Clinic Day:  06/06/2021  Referring physician: Mateo Flow, MD  This document serves as a record of services personally performed by Dequincy Macarthur Critchley, MD. It was created on their behalf by [facility] E, a trained medical scribe. The creation of this record is based on the scribe's personal observations and the provider's statements to them.  HISTORY OF PRESENT ILLNESS:  The patient is a 70 y.o. female with metastatic renal cell carcinoma, proven per a right-sided thoracentesis in early December 2021.  She comes in today to be evaluated before heading into her 2nd cycle of maintenance nivolumab.  The patient states that she tolerated her 1st cycle without having any significant difficulties.  She claims to have an intermittent cough, which has been an issue in the past.  Otherwise, she is functioning very well on a daily basis.  Of note, her maintenance nivolumab was preceded by 3 cycles of nivolumab/ipilimumab; a 4th cycle was not given due to her being hospitalized for severe colitis related to the ipilimumab component of her combination immunotherapy.   PHYSICAL EXAM:  Blood pressure (!) 158/70, pulse 83, temperature 97.8 F (36.6 C), resp. rate 16, height 5\' 4"  (1.626 m), weight 138 lb 1.6 oz (62.6 kg), SpO2 95 %. Wt Readings from Last 3 Encounters:  06/06/21 138 lb 1.6 oz (62.6 kg)  05/10/21 121 lb 6.4 oz (55.1 kg)  05/09/21 133 lb 11.2 oz (60.6 kg)   Body mass index is 23.7 kg/m. Performance status (ECOG): 0 Physical Exam Constitutional:      Appearance: Normal appearance. She is not ill-appearing (She continues to look physically better vs previous visits).     Comments:    HENT:     Mouth/Throat:     Mouth: Mucous membranes are moist.     Pharynx: Oropharynx is clear. No oropharyngeal exudate or posterior oropharyngeal erythema.  Cardiovascular:     Rate and Rhythm:  Normal rate and regular rhythm.     Heart sounds: No murmur heard.   No friction rub. No gallop.  Pulmonary:     Effort: Pulmonary effort is normal. No respiratory distress.     Breath sounds: No wheezing, rhonchi or rales.  Chest:  Breasts:    Right: No axillary adenopathy or supraclavicular adenopathy.     Left: No axillary adenopathy or supraclavicular adenopathy.  Abdominal:     General: Bowel sounds are normal. There is no distension.     Palpations: Abdomen is soft. There is no mass.     Tenderness: There is no abdominal tenderness.  Musculoskeletal:        General: No swelling.     Right lower leg: No edema.     Left lower leg: No edema.  Lymphadenopathy:     Cervical: No cervical adenopathy.     Upper Body:     Right upper body: No supraclavicular or axillary adenopathy.     Left upper body: No supraclavicular or axillary adenopathy.     Lower Body: No right inguinal adenopathy. No left inguinal adenopathy.  Skin:    General: Skin is warm.     Coloration: Skin is not jaundiced.     Findings: No lesion or rash.  Neurological:     General: No focal deficit present.     Mental Status: She is alert and oriented to person, place, and time. Mental status is at baseline.  Cranial Nerves: Cranial nerves are intact.  Psychiatric:        Mood and Affect: Mood normal.        Behavior: Behavior normal.        Thought Content: Thought content normal.   LABS:      ASSESSMENT & PLAN:  A 70 y.o. female with metastatic renal cell carcinoma.  She will proceed with her 2nd cycle of maintenance nivolumab tomorrow, which she is receiving every 4 weeks.  Her lung exam was normal.  She walked the hallways without having any oxygen desaturation.  Overall, I do not get the sense there is worsening disease present or issues with pneumonitis from her immunotherapy.   Clinically, she is doing very well.  I will see her back in 4 weeks before she heads into her 3rd cycle of maintenance  nivolumab.  The patient understands all the plans discussed today and is in agreement with them.   I, Rita Ohara, am acting as scribe for Marice Potter, MD    I have reviewed this report as typed by the medical scribe, and it is complete and accurate.  Dequincy Macarthur Critchley, MD

## 2021-06-05 DIAGNOSIS — R739 Hyperglycemia, unspecified: Secondary | ICD-10-CM | POA: Diagnosis not present

## 2021-06-05 DIAGNOSIS — J90 Pleural effusion, not elsewhere classified: Secondary | ICD-10-CM | POA: Diagnosis not present

## 2021-06-05 DIAGNOSIS — I10 Essential (primary) hypertension: Secondary | ICD-10-CM | POA: Diagnosis not present

## 2021-06-05 DIAGNOSIS — E860 Dehydration: Secondary | ICD-10-CM | POA: Diagnosis not present

## 2021-06-05 DIAGNOSIS — K51011 Ulcerative (chronic) pancolitis with rectal bleeding: Secondary | ICD-10-CM | POA: Diagnosis not present

## 2021-06-05 DIAGNOSIS — C7800 Secondary malignant neoplasm of unspecified lung: Secondary | ICD-10-CM | POA: Diagnosis not present

## 2021-06-05 DIAGNOSIS — E871 Hypo-osmolality and hyponatremia: Secondary | ICD-10-CM | POA: Diagnosis not present

## 2021-06-05 DIAGNOSIS — J159 Unspecified bacterial pneumonia: Secondary | ICD-10-CM | POA: Diagnosis not present

## 2021-06-05 DIAGNOSIS — C641 Malignant neoplasm of right kidney, except renal pelvis: Secondary | ICD-10-CM | POA: Diagnosis not present

## 2021-06-06 ENCOUNTER — Encounter: Payer: Self-pay | Admitting: Oncology

## 2021-06-06 ENCOUNTER — Inpatient Hospital Stay: Payer: Medicare PPO | Attending: Oncology

## 2021-06-06 ENCOUNTER — Telehealth: Payer: Self-pay | Admitting: Oncology

## 2021-06-06 ENCOUNTER — Other Ambulatory Visit: Payer: Self-pay | Admitting: Oncology

## 2021-06-06 ENCOUNTER — Inpatient Hospital Stay (INDEPENDENT_AMBULATORY_CARE_PROVIDER_SITE_OTHER): Payer: Medicare PPO | Admitting: Oncology

## 2021-06-06 ENCOUNTER — Other Ambulatory Visit: Payer: Self-pay

## 2021-06-06 VITALS — BP 158/70 | HR 83 | Temp 97.8°F | Resp 16 | Ht 64.0 in | Wt 138.1 lb

## 2021-06-06 DIAGNOSIS — D3001 Benign neoplasm of right kidney: Secondary | ICD-10-CM

## 2021-06-06 DIAGNOSIS — Z5112 Encounter for antineoplastic immunotherapy: Secondary | ICD-10-CM | POA: Diagnosis not present

## 2021-06-06 DIAGNOSIS — D649 Anemia, unspecified: Secondary | ICD-10-CM | POA: Diagnosis not present

## 2021-06-06 DIAGNOSIS — C641 Malignant neoplasm of right kidney, except renal pelvis: Secondary | ICD-10-CM

## 2021-06-06 DIAGNOSIS — Z79899 Other long term (current) drug therapy: Secondary | ICD-10-CM | POA: Insufficient documentation

## 2021-06-06 DIAGNOSIS — C649 Malignant neoplasm of unspecified kidney, except renal pelvis: Secondary | ICD-10-CM | POA: Diagnosis not present

## 2021-06-06 LAB — CBC AND DIFFERENTIAL
HCT: 38 (ref 36–46)
Hemoglobin: 13.1 (ref 12.0–16.0)
Neutrophils Absolute: 3.95
Platelets: 269 (ref 150–399)
WBC: 6.7

## 2021-06-06 LAB — BASIC METABOLIC PANEL
BUN: 11 (ref 4–21)
CO2: 27 — AB (ref 13–22)
Chloride: 104 (ref 99–108)
Creatinine: 0.5 (ref 0.5–1.1)
Glucose: 133
Potassium: 3.9 (ref 3.4–5.3)
Sodium: 139 (ref 137–147)

## 2021-06-06 LAB — HEPATIC FUNCTION PANEL
ALT: 26 (ref 7–35)
AST: 32 (ref 13–35)
Alkaline Phosphatase: 109 (ref 25–125)
Bilirubin, Total: 0.4

## 2021-06-06 LAB — COMPREHENSIVE METABOLIC PANEL
Albumin: 4.2 (ref 3.5–5.0)
Calcium: 8.8 (ref 8.7–10.7)

## 2021-06-06 LAB — TSH: TSH: 2.184 u[IU]/mL (ref 0.350–4.500)

## 2021-06-06 LAB — CBC: RBC: 3.92 (ref 3.87–5.11)

## 2021-06-06 NOTE — Telephone Encounter (Signed)
Per 6/15 los next appt scheduled and given to patient

## 2021-06-07 ENCOUNTER — Inpatient Hospital Stay: Payer: Medicare PPO

## 2021-06-07 VITALS — BP 141/60 | HR 85 | Temp 98.5°F | Resp 18 | Ht 64.0 in | Wt 139.1 lb

## 2021-06-07 DIAGNOSIS — Z79899 Other long term (current) drug therapy: Secondary | ICD-10-CM | POA: Diagnosis not present

## 2021-06-07 DIAGNOSIS — D3001 Benign neoplasm of right kidney: Secondary | ICD-10-CM

## 2021-06-07 DIAGNOSIS — Z5112 Encounter for antineoplastic immunotherapy: Secondary | ICD-10-CM | POA: Diagnosis not present

## 2021-06-07 DIAGNOSIS — C641 Malignant neoplasm of right kidney, except renal pelvis: Secondary | ICD-10-CM | POA: Diagnosis not present

## 2021-06-07 LAB — T4: T4, Total: 5.4 ug/dL (ref 4.5–12.0)

## 2021-06-07 MED ORDER — SODIUM CHLORIDE 0.9 % IV SOLN
Freq: Once | INTRAVENOUS | Status: AC
  Filled 2021-06-07: qty 250

## 2021-06-07 MED ORDER — SODIUM CHLORIDE 0.9 % IV SOLN
480.0000 mg | Freq: Once | INTRAVENOUS | Status: AC
  Administered 2021-06-07: 480 mg via INTRAVENOUS
  Filled 2021-06-07: qty 48

## 2021-06-07 MED ORDER — HEPARIN SOD (PORK) LOCK FLUSH 100 UNIT/ML IV SOLN
500.0000 [IU] | Freq: Once | INTRAVENOUS | Status: AC | PRN
  Administered 2021-06-07: 500 [IU]
  Filled 2021-06-07: qty 5

## 2021-06-07 NOTE — Patient Instructions (Signed)
Nivolumab injection What is this medication? NIVOLUMAB (nye VOL ue mab) is a monoclonal antibody. It treats certain types of cancer. Some of the cancers treated are colon cancer, head and neck cancer,Hodgkin lymphoma, lung cancer, and melanoma. This medicine may be used for other purposes; ask your health care provider orpharmacist if you have questions. COMMON BRAND NAME(S): Opdivo What should I tell my care team before I take this medication? They need to know if you have any of these conditions: autoimmune diseases like Crohn's disease, ulcerative colitis, or lupus have had or planning to have an allogeneic stem cell transplant (uses someone else's stem cells) history of chest radiation history of organ transplant nervous system problems like myasthenia gravis or Guillain-Barre syndrome an unusual or allergic reaction to nivolumab, other medicines, foods, dyes, or preservatives pregnant or trying to get pregnant breast-feeding How should I use this medication? This medicine is for infusion into a vein. It is given by a health careprofessional in a hospital or clinic setting. A special MedGuide will be given to you before each treatment. Be sure to readthis information carefully each time. Talk to your pediatrician regarding the use of this medicine in children. While this drug may be prescribed for children as young as 12 years for selectedconditions, precautions do apply. Overdosage: If you think you have taken too much of this medicine contact apoison control center or emergency room at once. NOTE: This medicine is only for you. Do not share this medicine with others. What if I miss a dose? It is important not to miss your dose. Call your doctor or health careprofessional if you are unable to keep an appointment. What may interact with this medication? Interactions have not been studied. This list may not describe all possible interactions. Give your health care provider a list of all  the medicines, herbs, non-prescription drugs, or dietary supplements you use. Also tell them if you smoke, drink alcohol, or use illegaldrugs. Some items may interact with your medicine. What should I watch for while using this medication? This drug may make you feel generally unwell. Continue your course of treatmenteven though you feel ill unless your doctor tells you to stop. You may need blood work done while you are taking this medicine. Do not become pregnant while taking this medicine or for 5 months after stopping it. Women should inform their doctor if they wish to become pregnant or think they might be pregnant. There is a potential for serious side effects to an unborn child. Talk to your health care professional or pharmacist for more information. Do not breast-feed an infant while taking this medicine orfor 5 months after stopping it. What side effects may I notice from receiving this medication? Side effects that you should report to your doctor or health care professionalas soon as possible: allergic reactions like skin rash, itching or hives, swelling of the face, lips, or tongue breathing problems blood in the urine bloody or watery diarrhea or black, tarry stools changes in emotions or moods changes in vision chest pain cough dizziness feeling faint or lightheaded, falls fever, chills headache with fever, neck stiffness, confusion, loss of memory, sensitivity to light, hallucination, loss of contact with reality, or seizures joint pain mouth sores redness, blistering, peeling or loosening of the skin, including inside the mouth severe muscle pain or weakness signs and symptoms of high blood sugar such as dizziness; dry mouth; dry skin; fruity breath; nausea; stomach pain; increased hunger or thirst; increased urination signs and symptoms of  kidney injury like trouble passing urine or change in the amount of urine signs and symptoms of liver injury like dark yellow or brown  urine; general ill feeling or flu-like symptoms; light-colored stools; loss of appetite; nausea; right upper belly pain; unusually weak or tired; yellowing of the eyes or skin swelling of the ankles, feet, hands trouble passing urine or change in the amount of urine unusually weak or tired weight gain or loss Side effects that usually do not require medical attention (report to yourdoctor or health care professional if they continue or are bothersome): bone pain constipation decreased appetite diarrhea muscle pain nausea, vomiting tiredness This list may not describe all possible side effects. Call your doctor for medical advice about side effects. You may report side effects to FDA at1-800-FDA-1088. Where should I keep my medication? This drug is given in a hospital or clinic and will not be stored at home. NOTE: This sheet is a summary. It may not cover all possible information. If you have questions about this medicine, talk to your doctor, pharmacist, orhealth care provider.  2022 Elsevier/Gold Standard (2020-04-12 10:08:25)

## 2021-06-11 DIAGNOSIS — I10 Essential (primary) hypertension: Secondary | ICD-10-CM | POA: Diagnosis not present

## 2021-06-11 DIAGNOSIS — Z23 Encounter for immunization: Secondary | ICD-10-CM | POA: Diagnosis not present

## 2021-06-11 DIAGNOSIS — K51011 Ulcerative (chronic) pancolitis with rectal bleeding: Secondary | ICD-10-CM | POA: Diagnosis not present

## 2021-06-11 DIAGNOSIS — E871 Hypo-osmolality and hyponatremia: Secondary | ICD-10-CM | POA: Diagnosis not present

## 2021-06-11 DIAGNOSIS — C7800 Secondary malignant neoplasm of unspecified lung: Secondary | ICD-10-CM | POA: Diagnosis not present

## 2021-06-11 DIAGNOSIS — R739 Hyperglycemia, unspecified: Secondary | ICD-10-CM | POA: Diagnosis not present

## 2021-06-11 DIAGNOSIS — J90 Pleural effusion, not elsewhere classified: Secondary | ICD-10-CM | POA: Diagnosis not present

## 2021-06-11 DIAGNOSIS — J159 Unspecified bacterial pneumonia: Secondary | ICD-10-CM | POA: Diagnosis not present

## 2021-06-11 DIAGNOSIS — E860 Dehydration: Secondary | ICD-10-CM | POA: Diagnosis not present

## 2021-06-11 DIAGNOSIS — C641 Malignant neoplasm of right kidney, except renal pelvis: Secondary | ICD-10-CM | POA: Diagnosis not present

## 2021-06-12 ENCOUNTER — Encounter: Payer: Self-pay | Admitting: Oncology

## 2021-06-13 DIAGNOSIS — C641 Malignant neoplasm of right kidney, except renal pelvis: Secondary | ICD-10-CM | POA: Diagnosis not present

## 2021-06-13 DIAGNOSIS — J90 Pleural effusion, not elsewhere classified: Secondary | ICD-10-CM | POA: Diagnosis not present

## 2021-06-14 DIAGNOSIS — H903 Sensorineural hearing loss, bilateral: Secondary | ICD-10-CM | POA: Diagnosis not present

## 2021-06-27 ENCOUNTER — Encounter: Payer: Self-pay | Admitting: Oncology

## 2021-06-27 ENCOUNTER — Encounter: Payer: Self-pay | Admitting: Hematology and Oncology

## 2021-07-03 NOTE — Progress Notes (Signed)
[facility]  [address] [place],  [place]  [address] [phone]  Clinic Day:  07/04/2021  Referring physician: Mateo Flow, MD  This document serves as a record of services personally performed by Dequincy Macarthur Critchley, MD. It was created on their behalf by [facility] E, a trained medical scribe. The creation of this record is based on the scribe's personal observations and the provider's statements to them.  HISTORY OF PRESENT ILLNESS:  The patient is a 70 y.o. female with metastatic renal cell carcinoma, proven per a right-sided thoracentesis in early December 2021.  She comes in today to be evaluated before heading into her 3rd cycle of maintenance nivolumab.  The patient states that she tolerated her 2nd cycle very well without having any significant difficulties.  She has occasional shortness of breath upon exertion.  Otherwise, she is functioning very well on a daily basis.  Of note, her maintenance nivolumab was preceded by 3 cycles of nivolumab/ipilimumab; a 4th cycle was not given due to her being hospitalized for severe colitis related to the ipilimumab component of her combination immunotherapy.   PHYSICAL EXAM:  Blood pressure (!) 165/71, pulse 66, temperature 97.7 F (36.5 C), resp. rate 14, height 5\' 4"  (1.626 m), weight 144 lb 1.6 oz (65.4 kg), SpO2 97 %. Wt Readings from Last 3 Encounters:  07/04/21 144 lb 1.6 oz (65.4 kg)  06/07/21 139 lb 1.3 oz (63.1 kg)  06/06/21 138 lb 1.6 oz (62.6 kg)   Body mass index is 24.73 kg/m. Performance status (ECOG): 0 Physical Exam Constitutional:      Appearance: Normal appearance. She is not ill-appearing (She continues to look physically better vs previous visits).     Comments:    HENT:     Mouth/Throat:     Mouth: Mucous membranes are moist.     Pharynx: Oropharynx is clear. No oropharyngeal exudate or posterior oropharyngeal erythema.  Cardiovascular:     Rate and Rhythm: Normal rate and  regular rhythm.     Heart sounds: No murmur heard.   No friction rub. No gallop.  Pulmonary:     Effort: Pulmonary effort is normal. No respiratory distress.     Breath sounds: No wheezing, rhonchi or rales.  Chest:  Breasts:    Right: No axillary adenopathy or supraclavicular adenopathy.     Left: No axillary adenopathy or supraclavicular adenopathy.  Abdominal:     General: Bowel sounds are normal. There is no distension.     Palpations: Abdomen is soft. There is no mass.     Tenderness: There is no abdominal tenderness.  Musculoskeletal:        General: No swelling.     Right lower leg: No edema.     Left lower leg: No edema.  Lymphadenopathy:     Cervical: No cervical adenopathy.     Upper Body:     Right upper body: No supraclavicular or axillary adenopathy.     Left upper body: No supraclavicular or axillary adenopathy.     Lower Body: No right inguinal adenopathy. No left inguinal adenopathy.  Skin:    General: Skin is warm.     Coloration: Skin is not jaundiced.     Findings: No lesion or rash.  Neurological:     General: No focal deficit present.     Mental Status: She is alert and oriented to person, place, and time. Mental status is at baseline.     Cranial Nerves: Cranial nerves  are intact.  Psychiatric:        Mood and Affect: Mood normal.        Behavior: Behavior normal.        Thought Content: Thought content normal.   LABS:    Ref. Range 07/04/2021 00:00  Sodium Latest Ref Range: 137 - 147  138  Potassium Latest Ref Range: 3.4 - 5.3  4.0  Chloride Latest Ref Range: 99 - 108  105  CO2 Latest Ref Range: 13 - 22  24 (A)  Glucose Unknown 107  BUN Latest Ref Range: 4 - 21  13  Creatinine Latest Ref Range: 0.5 - 1.1  0.6  Calcium Latest Ref Range: 8.7 - 10.7  8.7  Alkaline Phosphatase Latest Ref Range: 25 - 125  90  Albumin Latest Ref Range: 3.5 - 5.0  4.1  AST Latest Ref Range: 13 - 35  41 (A)  ALT Latest Ref Range: 7 - 35  28  Bilirubin, Total Unknown  0.3  WBC Unknown 6.2  RBC Latest Ref Range: 3.87 - 5.11  4.2  Hemoglobin Latest Ref Range: 12.0 - 16.0  13.7  HCT Latest Ref Range: 36 - 46  41  Platelets Latest Ref Range: 150 - 399  178  NEUT# Unknown 3.35   ASSESSMENT & PLAN:  A 70 y.o. female with metastatic renal cell carcinoma.  She will proceed with her 3rd cycle of maintenance nivolumab tomorrow, which she is receiving every 4 weeks.  Clinically, she is doing very well.  I will see her back in 4 weeks before she heads into her 4th cycle of maintenance nivolumab.  Repeat CT scans will be done before her next visit to ascertain her new disease baseline after 3 cycles of maintenance nivolumab.   The patient understands all the plans discussed today and is in agreement with them.    I, Rita Ohara, am acting as scribe for Marice Potter, MD    I have reviewed this report as typed by the medical scribe, and it is complete and accurate.  Dequincy Macarthur Critchley, MD

## 2021-07-04 ENCOUNTER — Other Ambulatory Visit: Payer: Self-pay

## 2021-07-04 ENCOUNTER — Other Ambulatory Visit: Payer: Self-pay | Admitting: Oncology

## 2021-07-04 ENCOUNTER — Telehealth: Payer: Self-pay | Admitting: Oncology

## 2021-07-04 ENCOUNTER — Inpatient Hospital Stay: Payer: Medicare PPO | Attending: Oncology | Admitting: Oncology

## 2021-07-04 ENCOUNTER — Inpatient Hospital Stay: Payer: Medicare PPO

## 2021-07-04 ENCOUNTER — Encounter: Payer: Self-pay | Admitting: Oncology

## 2021-07-04 VITALS — BP 165/71 | HR 66 | Temp 97.7°F | Resp 14 | Ht 64.0 in | Wt 144.1 lb

## 2021-07-04 DIAGNOSIS — C641 Malignant neoplasm of right kidney, except renal pelvis: Secondary | ICD-10-CM | POA: Diagnosis not present

## 2021-07-04 DIAGNOSIS — D3001 Benign neoplasm of right kidney: Secondary | ICD-10-CM

## 2021-07-04 DIAGNOSIS — Z5112 Encounter for antineoplastic immunotherapy: Secondary | ICD-10-CM | POA: Diagnosis not present

## 2021-07-04 DIAGNOSIS — Z79899 Other long term (current) drug therapy: Secondary | ICD-10-CM | POA: Diagnosis not present

## 2021-07-04 DIAGNOSIS — C649 Malignant neoplasm of unspecified kidney, except renal pelvis: Secondary | ICD-10-CM | POA: Diagnosis not present

## 2021-07-04 LAB — BASIC METABOLIC PANEL
BUN: 13 (ref 4–21)
CO2: 24 — AB (ref 13–22)
Chloride: 105 (ref 99–108)
Creatinine: 0.6 (ref 0.5–1.1)
Glucose: 107
Potassium: 4 (ref 3.4–5.3)
Sodium: 138 (ref 137–147)

## 2021-07-04 LAB — CBC AND DIFFERENTIAL
HCT: 41 (ref 36–46)
Hemoglobin: 13.7 (ref 12.0–16.0)
Neutrophils Absolute: 3.35
Platelets: 178 (ref 150–399)
WBC: 6.2

## 2021-07-04 LAB — COMPREHENSIVE METABOLIC PANEL
Albumin: 4.1 (ref 3.5–5.0)
Calcium: 8.7 (ref 8.7–10.7)

## 2021-07-04 LAB — HEPATIC FUNCTION PANEL
ALT: 28 (ref 7–35)
AST: 41 — AB (ref 13–35)
Alkaline Phosphatase: 90 (ref 25–125)
Bilirubin, Total: 0.3

## 2021-07-04 LAB — CBC: RBC: 4.2 (ref 3.87–5.11)

## 2021-07-04 LAB — TSH: TSH: 1.777 u[IU]/mL (ref 0.350–4.500)

## 2021-07-04 NOTE — Telephone Encounter (Signed)
Per 7/13 LOS, patient scheduled for 8/10 Follow Up.  Will order Labs, CT Scan on 8/9 after receiving Authorization

## 2021-07-04 NOTE — Telephone Encounter (Signed)
Per 7/13 LOS, patient scheduled for 8/10 Follow Up.  Labs, CT to be ordered when Authorization

## 2021-07-05 ENCOUNTER — Inpatient Hospital Stay: Payer: Medicare PPO

## 2021-07-05 VITALS — BP 158/72 | HR 69 | Temp 98.4°F | Resp 18 | Ht 64.0 in | Wt 140.2 lb

## 2021-07-05 DIAGNOSIS — Z5112 Encounter for antineoplastic immunotherapy: Secondary | ICD-10-CM | POA: Diagnosis not present

## 2021-07-05 DIAGNOSIS — Z79899 Other long term (current) drug therapy: Secondary | ICD-10-CM | POA: Diagnosis not present

## 2021-07-05 DIAGNOSIS — D3001 Benign neoplasm of right kidney: Secondary | ICD-10-CM

## 2021-07-05 DIAGNOSIS — C641 Malignant neoplasm of right kidney, except renal pelvis: Secondary | ICD-10-CM | POA: Diagnosis not present

## 2021-07-05 LAB — T4: T4, Total: 5 ug/dL (ref 4.5–12.0)

## 2021-07-05 MED ORDER — HEPARIN SOD (PORK) LOCK FLUSH 100 UNIT/ML IV SOLN
500.0000 [IU] | Freq: Once | INTRAVENOUS | Status: AC | PRN
  Administered 2021-07-05: 500 [IU]
  Filled 2021-07-05: qty 5

## 2021-07-05 MED ORDER — SODIUM CHLORIDE 0.9 % IV SOLN
480.0000 mg | Freq: Once | INTRAVENOUS | Status: AC
  Administered 2021-07-05: 480 mg via INTRAVENOUS
  Filled 2021-07-05: qty 48

## 2021-07-05 MED ORDER — SODIUM CHLORIDE 0.9 % IV SOLN
Freq: Once | INTRAVENOUS | Status: AC
  Filled 2021-07-05: qty 250

## 2021-07-05 NOTE — Patient Instructions (Signed)
[facility]  Discharge Instructions: Thank you for choosing [facility] to provide your oncology and hematology care.  If you have a lab appointment with the [facility], please go directly to the [facility] and check in at the registration area.   Wear comfortable clothing and clothing appropriate for easy access to any Portacath or PICC line.   We strive to give you quality time with your provider. You may need to reschedule your appointment if you arrive late (15 or more minutes).  Arriving late affects you and other patients whose appointments are after yours.  Also, if you miss three or more appointments without notifying the office, you may be dismissed from the clinic at the provider's discretion.      For prescription refill requests, have your pharmacy contact our office and allow 72 hours for refills to be completed.    Today you received the following chemotherapy and/or immunotherapy agents Nivolumab      To help prevent nausea and vomiting after your treatment, we encourage you to take your nausea medication as directed.  BELOW ARE SYMPTOMS THAT SHOULD BE REPORTED IMMEDIATELY: *FEVER GREATER THAN 100.4 F (38 C) OR HIGHER *CHILLS OR SWEATING *NAUSEA AND VOMITING THAT IS NOT CONTROLLED WITH YOUR NAUSEA MEDICATION *UNUSUAL SHORTNESS OF BREATH *UNUSUAL BRUISING OR BLEEDING *URINARY PROBLEMS (pain or burning when urinating, or frequent urination) *BOWEL PROBLEMS (unusual diarrhea, constipation, pain near the anus) TENDERNESS IN MOUTH AND THROAT WITH OR WITHOUT PRESENCE OF ULCERS (sore throat, sores in mouth, or a toothache) UNUSUAL RASH, SWELLING OR PAIN  UNUSUAL VAGINAL DISCHARGE OR ITCHING   Items with * indicate a potential emergency and should be followed up as soon as possible or go to the Emergency Department if any problems should occur.  Please show the CHEMOTHERAPY ALERT CARD or IMMUNOTHERAPY ALERT CARD at check-in to the  Emergency Department and triage nurse.  Should you have questions after your visit or need to cancel or reschedule your appointment, please contact [facility]  Dept: [phone]  and follow the prompts.  Office hours are 8:00 a.m. to 4:30 p.m. Monday - Friday. Please note that voicemails left after 4:00 p.m. may not be returned until the following business day.  We are closed weekends and major holidays. You have access to a nurse at all times for urgent questions. Please call the main number to the clinic Dept: [phone] and follow the prompts.  For any non-urgent questions, you may also contact your provider using MyChart. We now offer e-Visits for anyone 37 and older to request care online for non-urgent symptoms. For details visit [email or web address].   Also download the MyChart app! Go to the app store, search "MyChart", open the app, select [facility], and log in with your MyChart username and password.  Due to Covid, a mask is required upon entering the hospital/clinic. If you do not have a mask, one will be given to you upon arrival. For doctor visits, patients may have 1 support person aged 68 or older with them. For treatment visits, patients cannot have anyone with them due to current Covid guidelines and our immunocompromised population.

## 2021-07-06 ENCOUNTER — Encounter: Payer: Self-pay | Admitting: Oncology

## 2021-07-19 NOTE — Progress Notes (Signed)
[facility]  [address] [place],  [place]  [address] [phone]  Clinic Day:  08/01/2021  Referring physician: Mateo Flow, MD  This document serves as a record of services personally performed by [name] Wrightsman Macarthur Critchley, MD. It was created on their behalf by [facility] E, a trained medical scribe. The creation of this record is based on the scribe's personal observations and the provider's statements to them.  HISTORY OF PRESENT ILLNESS:  The patient is a 70 y.o. female with metastatic renal cell carcinoma, proven per a right-sided thoracentesis in early December 2021.  She comes in today to go over her CT scans to ascertain her new disease baseline after receiving her 3rd cycle of maintenance nivolumab.  The patient states that she tolerated her 3rd cycle very well without having any significant difficulties.  She still has occasional shortness of breath and a nonproductive cough.  She also has redeveloped a mild facial rash, for which she has been sporadically using facial cream to alleviate.  Otherwise, she continues to function very well on a daily basis.  Of note, her maintenance nivolumab was preceded by 3 cycles of nivolumab/ipilimumab; a 4th cycle was not given due to her being hospitalized for severe colitis related to the ipilimumab component of her combination immunotherapy.   PHYSICAL EXAM:  Blood pressure (!) 151/69, pulse 77, temperature 98.5 F (36.9 C), resp. rate 16, height 5\' 4"  (1.626 m), weight 144 lb 11.2 oz (65.6 kg), SpO2 96 %. Wt Readings from Last 3 Encounters:  08/01/21 144 lb 11.2 oz (65.6 kg)  07/05/21 140 lb 4 oz (63.6 kg)  07/04/21 144 lb 1.6 oz (65.4 kg)   Body mass index is 24.84 kg/m. Performance status (ECOG): 0 Physical Exam Constitutional:      Appearance: Normal appearance. She is not ill-appearing (She continues to look physically better vs previous visits).     Comments:    HENT:     Mouth/Throat:     Mouth:  Mucous membranes are moist.     Pharynx: Oropharynx is clear. No oropharyngeal exudate or posterior oropharyngeal erythema.  Cardiovascular:     Rate and Rhythm: Normal rate and regular rhythm.     Heart sounds: No murmur heard.   No friction rub. No gallop.  Pulmonary:     Effort: Pulmonary effort is normal. No respiratory distress.     Breath sounds: No wheezing, rhonchi or rales.  Chest:  Breasts:    Right: No axillary adenopathy or supraclavicular adenopathy.     Left: No axillary adenopathy or supraclavicular adenopathy.  Abdominal:     General: Bowel sounds are normal. There is no distension.     Palpations: Abdomen is soft. There is no mass.     Tenderness: There is no abdominal tenderness.  Musculoskeletal:        General: No swelling.     Right lower leg: No edema.     Left lower leg: No edema.  Lymphadenopathy:     Cervical: No cervical adenopathy.     Upper Body:     Right upper body: No supraclavicular or axillary adenopathy.     Left upper body: No supraclavicular or axillary adenopathy.     Lower Body: No right inguinal adenopathy. No left inguinal adenopathy.  Skin:    General: Skin is warm.     Coloration: Skin is not jaundiced.     Findings: Rash (mild facial rash) present. No lesion.  Neurological:  General: No focal deficit present.     Mental Status: She is alert and oriented to person, place, and time. Mental status is at baseline.     Cranial Nerves: Cranial nerves are intact.  Psychiatric:        Mood and Affect: Mood normal.        Behavior: Behavior normal.        Thought Content: Thought content normal.   SCANS:  CT chest, abdomen and pelvis with contrast from 07/31/2021 revealed the following:  FINDINGS: CT CHEST FINDINGS  Cardiovascular: Right IJ Port-A-Cath terminates in the right atrium. Atherosclerotic calcification of the aorta and coronary arteries. Heart is at the upper limits of normal in size. No  pericardial effusion.  Mediastinum/Nodes: Subcentimeter low-attenuation lesion in the left thyroid. No follow-up recommended. (Ref: J Am Coll Radiol. 2015 Feb;12(2): 143-50).No pathologically enlarged mediastinal, hilar or axillary lymph nodes. Esophagus is grossly unremarkable.  Lungs/Pleura: Centrilobular emphysema. Postsurgical scarring in the apical segment right upper lobe. Additional scattered subsegmental volume loss in the right lung. Postoperative changes in the left upper lobe. Pleuroparenchymal scarring at the base of the left hemithorax. 5 mm nodule along the left major fissure (4/45), similar to 12/25/2020. Together with posterior pleural thickening on the left, talc pleurodesis is favored. Large right pleural effusion.  Musculoskeletal: Degenerative changes in the spine. Probable bone island in the T6 vertebral body. Thinning of the anterolateral right fourth rib, posttraumatic or postoperative in etiology most likely. No worrisome lytic or sclerotic lesions.  CT ABDOMEN PELVIS FINDINGS  Hepatobiliary: Exophytic liver parenchyma along the posterior border of the right hepatic lobe. Liver is slightly decreased in attenuation diffusely. Focal blush of hyperattenuation in segment 7 of the liver (2/50), unchanged from 09/21/2020. Liver and gallbladder are otherwise unremarkable. No biliary ductal dilatation.  Pancreas: Negative.  Spleen: Negative.  Adrenals/Urinary Tract: Adrenal glands are unremarkable. Heterogeneous hyperdense mass in the lower pole right kidney measures 3.4 x 4.1 cm, similar to 03/29/2021, at which time it measured 3.0 x 4.2 cm when remeasured. Left kidney is unremarkable. Ureters are decompressed. Bladder is grossly unremarkable.  Stomach/Bowel: Stomach is decompressed. Small bowel and colon are otherwise unremarkable. Appendix is not visualized.  Vascular/Lymphatic: Atherosclerotic calcification of the aorta. No pathologically enlarged lymph  nodes.  Reproductive: No adnexal mass.  Other: No free fluid.  Mesenteries and peritoneum are unremarkable.  Musculoskeletal: There are scattered sclerotic foci in the pelvis, unchanged. Degenerative changes in the spine.  IMPRESSION: 1. Right renal mass is similar to 03/29/2021 and in keeping with renal cell carcinoma. No definitive evidence of metastatic disease. 2. Scattered sclerotic foci in the pelvis, unchanged from 12/25/2020, possibly bone islands. Attention on follow-up is recommended. 3. Large right pleural effusion. 4. Hepatic steatosis. Focal blush of hypermetabolism in the peripheral right hepatic lobe is unchanged from 09/21/2020. 5. Aortic atherosclerosis (ICD10-I70.0). Coronary artery calcification. 6.  Emphysema (ICD10-J43.9).   LABS:    Ref. Range 07/31/2021 00:00  Sodium Latest Ref Range: 137 - 147  137  Potassium Latest Ref Range: 3.4 - 5.3  4.2  Chloride Latest Ref Range: 99 - 108  103  CO2 Latest Ref Range: 13 - 22  25   Glucose Unknown 100  BUN Latest Ref Range: 4 - 21  14  Creatinine Latest Ref Range: 0.5 - 1.1  0.7  Calcium Latest Ref Range: 8.7 - 10.7  9.0  Alkaline Phosphatase Latest Ref Range: 25 - 125  72  Albumin Latest Ref Range: 3.5 - 5.0  4.3  AST Latest Ref Range: 13 - 35  32   ALT Latest Ref Range: 7 - 35  27  Bilirubin, Total Unknown 0.4  WBC Unknown 6.1  RBC Latest Ref Range: 3.87 - 5.11  4.4  Hemoglobin Latest Ref Range: 12.0 - 16.0  14.1  HCT Latest Ref Range: 36 - 46  42  Platelets Latest Ref Range: 150 - 399  181  NEUT# Unknown 3.42   ASSESSMENT & PLAN:  A 69 y.o. female with metastatic renal cell carcinoma.  In clinic today, I went over her scans with her, for which she could see her disease has held stable.  As I believe her dry cough is due to her right pleural effusion, I will arrange for her to undergo a thoracentesis tomorrow.  Cytology will be done to see if this effusion remains malignant in nature.  She will proceed with  her 4th cycle of maintenance nivolumab tomorrow, which she is receiving every 4 weeks.  With respect to her facial rash, she knows to consider Benadryl topical cream.  Otherwise, I will see her back in 4 weeks before she heads into her 5th cycle of maintenance nivolumab.  The patient understands all the plans discussed today and is in agreement with them.    I, [name] [name], am acting as scribe for Marice Potter, MD    I have reviewed this report as typed by the medical scribe, and it is complete and accurate.  [name] Nappi Macarthur Critchley, MD

## 2021-07-23 DIAGNOSIS — M1990 Unspecified osteoarthritis, unspecified site: Secondary | ICD-10-CM | POA: Diagnosis not present

## 2021-07-23 DIAGNOSIS — K219 Gastro-esophageal reflux disease without esophagitis: Secondary | ICD-10-CM | POA: Diagnosis not present

## 2021-07-23 DIAGNOSIS — C3490 Malignant neoplasm of unspecified part of unspecified bronchus or lung: Secondary | ICD-10-CM | POA: Diagnosis not present

## 2021-07-23 DIAGNOSIS — F325 Major depressive disorder, single episode, in full remission: Secondary | ICD-10-CM | POA: Diagnosis not present

## 2021-07-23 DIAGNOSIS — C7900 Secondary malignant neoplasm of unspecified kidney and renal pelvis: Secondary | ICD-10-CM | POA: Diagnosis not present

## 2021-07-23 DIAGNOSIS — K5900 Constipation, unspecified: Secondary | ICD-10-CM | POA: Diagnosis not present

## 2021-07-23 DIAGNOSIS — G8929 Other chronic pain: Secondary | ICD-10-CM | POA: Diagnosis not present

## 2021-07-23 DIAGNOSIS — F439 Reaction to severe stress, unspecified: Secondary | ICD-10-CM | POA: Diagnosis not present

## 2021-07-23 DIAGNOSIS — F419 Anxiety disorder, unspecified: Secondary | ICD-10-CM | POA: Diagnosis not present

## 2021-07-31 DIAGNOSIS — C641 Malignant neoplasm of right kidney, except renal pelvis: Secondary | ICD-10-CM | POA: Diagnosis not present

## 2021-07-31 DIAGNOSIS — N2889 Other specified disorders of kidney and ureter: Secondary | ICD-10-CM | POA: Diagnosis not present

## 2021-07-31 DIAGNOSIS — J432 Centrilobular emphysema: Secondary | ICD-10-CM | POA: Diagnosis not present

## 2021-07-31 DIAGNOSIS — I700 Atherosclerosis of aorta: Secondary | ICD-10-CM | POA: Diagnosis not present

## 2021-07-31 DIAGNOSIS — K760 Fatty (change of) liver, not elsewhere classified: Secondary | ICD-10-CM | POA: Diagnosis not present

## 2021-07-31 DIAGNOSIS — J439 Emphysema, unspecified: Secondary | ICD-10-CM | POA: Diagnosis not present

## 2021-07-31 DIAGNOSIS — J90 Pleural effusion, not elsewhere classified: Secondary | ICD-10-CM | POA: Diagnosis not present

## 2021-07-31 DIAGNOSIS — I2510 Atherosclerotic heart disease of native coronary artery without angina pectoris: Secondary | ICD-10-CM | POA: Diagnosis not present

## 2021-08-01 ENCOUNTER — Telehealth: Payer: Self-pay | Admitting: Oncology

## 2021-08-01 ENCOUNTER — Inpatient Hospital Stay: Payer: Medicare PPO | Attending: Oncology | Admitting: Oncology

## 2021-08-01 ENCOUNTER — Other Ambulatory Visit: Payer: Self-pay

## 2021-08-01 VITALS — BP 151/69 | HR 77 | Temp 98.5°F | Resp 16 | Ht 64.0 in | Wt 144.7 lb

## 2021-08-01 DIAGNOSIS — C641 Malignant neoplasm of right kidney, except renal pelvis: Secondary | ICD-10-CM | POA: Diagnosis not present

## 2021-08-01 DIAGNOSIS — Z79899 Other long term (current) drug therapy: Secondary | ICD-10-CM | POA: Insufficient documentation

## 2021-08-01 DIAGNOSIS — Z5112 Encounter for antineoplastic immunotherapy: Secondary | ICD-10-CM | POA: Insufficient documentation

## 2021-08-02 ENCOUNTER — Other Ambulatory Visit: Payer: Self-pay

## 2021-08-02 ENCOUNTER — Inpatient Hospital Stay: Payer: Medicare PPO

## 2021-08-02 VITALS — BP 144/77 | HR 63 | Temp 97.8°F | Resp 16 | Wt 141.1 lb

## 2021-08-02 DIAGNOSIS — J910 Malignant pleural effusion: Secondary | ICD-10-CM | POA: Diagnosis not present

## 2021-08-02 DIAGNOSIS — Z5112 Encounter for antineoplastic immunotherapy: Secondary | ICD-10-CM | POA: Diagnosis not present

## 2021-08-02 DIAGNOSIS — D3001 Benign neoplasm of right kidney: Secondary | ICD-10-CM

## 2021-08-02 DIAGNOSIS — Z79899 Other long term (current) drug therapy: Secondary | ICD-10-CM | POA: Diagnosis not present

## 2021-08-02 DIAGNOSIS — C641 Malignant neoplasm of right kidney, except renal pelvis: Secondary | ICD-10-CM | POA: Diagnosis not present

## 2021-08-02 DIAGNOSIS — J90 Pleural effusion, not elsewhere classified: Secondary | ICD-10-CM | POA: Diagnosis not present

## 2021-08-02 DIAGNOSIS — C649 Malignant neoplasm of unspecified kidney, except renal pelvis: Secondary | ICD-10-CM | POA: Diagnosis not present

## 2021-08-02 MED ORDER — HEPARIN SOD (PORK) LOCK FLUSH 100 UNIT/ML IV SOLN
500.0000 [IU] | Freq: Once | INTRAVENOUS | Status: AC | PRN
  Administered 2021-08-02: 500 [IU]
  Filled 2021-08-02: qty 5

## 2021-08-02 MED ORDER — SODIUM CHLORIDE 0.9 % IV SOLN
Freq: Once | INTRAVENOUS | Status: AC
Start: 2021-08-02 — End: 2021-08-02
  Filled 2021-08-02: qty 250

## 2021-08-02 MED ORDER — SODIUM CHLORIDE 0.9 % IV SOLN
480.0000 mg | Freq: Once | INTRAVENOUS | Status: AC
  Administered 2021-08-02: 480 mg via INTRAVENOUS
  Filled 2021-08-02: qty 48

## 2021-08-02 MED ORDER — SODIUM CHLORIDE 0.9% FLUSH
10.0000 mL | INTRAVENOUS | Status: DC | PRN
  Administered 2021-08-02: 10 mL
  Filled 2021-08-02: qty 10

## 2021-08-02 NOTE — Patient Instructions (Signed)
[name]  Discharge Instructions: Thank you for choosing [facility] to provide your oncology and hematology care.  If you have a lab appointment with the [facility], please go directly to the [facility] and check in at the registration area.   Wear comfortable clothing and clothing appropriate for easy access to any Portacath or PICC line.   We strive to give you quality time with your provider. You may need to reschedule your appointment if you arrive late (15 or more minutes).  Arriving late affects you and other patients whose appointments are after yours.  Also, if you miss three or more appointments without notifying the office, you may be dismissed from the clinic at the provider's discretion.      For prescription refill requests, have your pharmacy contact our office and allow 72 hours for refills to be completed.    Today you received the following chemotherapy and/or immunotherapy agent:Opdivo   To help prevent nausea and vomiting after your treatment, we encourage you to take your nausea medication as directed.  BELOW ARE SYMPTOMS THAT SHOULD BE REPORTED IMMEDIATELY: *FEVER GREATER THAN 100.4 F (38 C) OR HIGHER *CHILLS OR SWEATING *NAUSEA AND VOMITING THAT IS NOT CONTROLLED WITH YOUR NAUSEA MEDICATION *UNUSUAL SHORTNESS OF BREATH *UNUSUAL BRUISING OR BLEEDING *URINARY PROBLEMS (pain or burning when urinating, or frequent urination) *BOWEL PROBLEMS (unusual diarrhea, constipation, pain near the anus) TENDERNESS IN MOUTH AND THROAT WITH OR WITHOUT PRESENCE OF ULCERS (sore throat, sores in mouth, or a toothache) UNUSUAL RASH, SWELLING OR PAIN  UNUSUAL VAGINAL DISCHARGE OR ITCHING   Items with * indicate a potential emergency and should be followed up as soon as possible or go to the Emergency Department if any problems should occur.  Please show the CHEMOTHERAPY ALERT CARD or IMMUNOTHERAPY ALERT CARD at check-in to the Emergency  Department and triage nurse.  Should you have questions after your visit or need to cancel or reschedule your appointment, please contact [facility]  Dept: [phone]  and follow the prompts.  Office hours are 8:00 a.m. to 4:30 p.m. Monday - Friday. Please note that voicemails left after 4:00 p.m. may not be returned until the following business day.  We are closed weekends and major holidays. You have access to a nurse at all times for urgent questions. Please call the main number to the clinic Dept: [phone] and follow the prompts.  For any non-urgent questions, you may also contact your provider using MyChart. We now offer e-Visits for anyone 26 and older to request care online for non-urgent symptoms. For details visit [email or web address].   Also download the MyChart app! Go to the app store, search "MyChart", open the app, select [facility], and log in with your MyChart username and password.  Due to Covid, a mask is required upon entering the hospital/clinic. If you do not have a mask, one will be given to you upon arrival. For doctor visits, patients may have 1 support person aged 82 or older with them. For treatment visits, patients cannot have anyone with them due to current Covid guidelines and our immunocompromised population.

## 2021-08-02 NOTE — Progress Notes (Signed)
Discharged home, stable  

## 2021-08-03 DIAGNOSIS — R846 Abnormal cytological findings in specimens from respiratory organs and thorax: Secondary | ICD-10-CM | POA: Diagnosis not present

## 2021-08-06 ENCOUNTER — Encounter: Payer: Self-pay | Admitting: Oncology

## 2021-08-08 ENCOUNTER — Encounter: Payer: Self-pay | Admitting: Oncology

## 2021-08-10 ENCOUNTER — Encounter: Payer: Self-pay | Admitting: Oncology

## 2021-08-21 NOTE — Progress Notes (Signed)
[facility]  [address] [place],  [place]  [address] [phone]  Clinic Day:  08/29/2021  Referring physician: Mateo Flow, MD  This document serves as a record of services personally performed by Dequincy Macarthur Critchley, MD. It was created on their behalf by [facility] E, a trained medical scribe. The creation of this record is based on the scribe's personal observations and the provider's statements to them.  HISTORY OF PRESENT ILLNESS:  The patient is a 70 y.o. female with metastatic renal cell carcinoma, proven per a right-sided thoracentesis in early December 2021.  She comes in today prior to her 5th cycle of maintenance nivolumab.  The patient states that she tolerated her 4th cycle well without having any significant difficulties.  She still has an intermittently nonproductive cough, particularly at night.  She does have GERD, for which she takes Pepcid.  Of note, the patient has another thoracentesis done a few weeks ago, for which cytology did not reveal malignant cells.  This patient's maintenance nivolumab was preceded by 3 cycles of nivolumab/ipilimumab; a 4th cycle was not given due to her being hospitalized for severe colitis related to the ipilimumab component of her combination immunotherapy.   PHYSICAL EXAM:  Blood pressure (!) 164/72, pulse 73, temperature 98.3 F (36.8 C), resp. rate 18, height 5\' 4"  (1.626 m), weight 147 lb 1.6 oz (66.7 kg), SpO2 96 %. Wt Readings from Last 3 Encounters:  08/29/21 147 lb 1.6 oz (66.7 kg)  08/02/21 141 lb 1.3 oz (64 kg)  08/01/21 144 lb 11.2 oz (65.6 kg)   Body mass index is 25.25 kg/m. Performance status (ECOG): 0 Physical Exam Constitutional:      Appearance: Normal appearance. She is not ill-appearing (She continues to look physically better vs previous visits).     Comments:    HENT:     Mouth/Throat:     Mouth: Mucous membranes are moist.     Pharynx: Oropharynx is clear. No oropharyngeal  exudate or posterior oropharyngeal erythema.  Cardiovascular:     Rate and Rhythm: Normal rate and regular rhythm.     Heart sounds: No murmur heard.   No friction rub. No gallop.  Pulmonary:     Effort: Pulmonary effort is normal. No respiratory distress.     Breath sounds: No wheezing, rhonchi or rales.  Abdominal:     General: Bowel sounds are normal. There is no distension.     Palpations: Abdomen is soft. There is no mass.     Tenderness: There is no abdominal tenderness.  Musculoskeletal:        General: No swelling.     Right lower leg: No edema.     Left lower leg: No edema.  Lymphadenopathy:     Cervical: No cervical adenopathy.     Upper Body:     Right upper body: No supraclavicular or axillary adenopathy.     Left upper body: No supraclavicular or axillary adenopathy.     Lower Body: No right inguinal adenopathy. No left inguinal adenopathy.  Skin:    General: Skin is warm.     Coloration: Skin is not jaundiced.     Findings: Rash (very minimal facial rash) present. No lesion.  Neurological:     General: No focal deficit present.     Mental Status: She is alert and oriented to person, place, and time. Mental status is at baseline.     Cranial Nerves: Cranial nerves are intact.  Psychiatric:  Mood and Affect: Mood normal.        Behavior: Behavior normal.        Thought Content: Thought content normal.    LABS:    Ref. Range 08/29/2021 00:00  Sodium Latest Ref Range: 137 - 147  141  Potassium Latest Ref Range: 3.4 - 5.3  4.0  Chloride Latest Ref Range: 99 - 108  106  CO2 Latest Ref Range: 13 - 22  26 (A)  Glucose Unknown 122  BUN Latest Ref Range: 4 - 21  17  Creatinine Latest Ref Range: 0.5 - 1.1  0.8  Calcium Latest Ref Range: 8.7 - 10.7  8.8  Alkaline Phosphatase Latest Ref Range: 25 - 125  76  Albumin Latest Ref Range: 3.5 - 5.0  4.5  AST Latest Ref Range: 13 - 35  35  ALT Latest Ref Range: 7 - 35  33  Bilirubin, Total Unknown 0.6  WBC Unknown  6.6  RBC Latest Ref Range: 3.87 - 5.11  4.77  Hemoglobin Latest Ref Range: 12.0 - 16.0  14.9  HCT Latest Ref Range: 36 - 46  46  Platelets Latest Ref Range: 150 - 399  187  NEUT# Unknown 3.83   ASSESSMENT & PLAN:  A 69 y.o. female with metastatic renal cell carcinoma.  She will proceed with her 5th cycle of maintenance nivolumab tomorrow, which she is receiving every 4 weeks. With respect to her intermittent nighttime cough, I do believe it is related to her GERD.  She also admits to having difficulty swallowing.  I did recommend that she see a gastroenterologist for these issues, for which she wishes to defer for now.  Overall, she clinically appears to be doing well.  I will see her back in 4 weeks before she heads into her 6th cycle of maintenance nivolumab.  The patient understands all the plans discussed today and is in agreement with them.    I, Rita Ohara, am acting as scribe for Marice Potter, MD    I have reviewed this report as typed by the medical scribe, and it is complete and accurate.  Dequincy Macarthur Critchley, MD

## 2021-08-29 ENCOUNTER — Other Ambulatory Visit: Payer: Self-pay | Admitting: Hematology and Oncology

## 2021-08-29 ENCOUNTER — Inpatient Hospital Stay: Payer: Medicare PPO | Attending: Oncology | Admitting: Oncology

## 2021-08-29 ENCOUNTER — Inpatient Hospital Stay: Payer: Medicare PPO

## 2021-08-29 ENCOUNTER — Telehealth: Payer: Self-pay | Admitting: Oncology

## 2021-08-29 VITALS — BP 164/72 | HR 73 | Temp 98.3°F | Resp 18 | Ht 64.0 in | Wt 147.1 lb

## 2021-08-29 DIAGNOSIS — D3001 Benign neoplasm of right kidney: Secondary | ICD-10-CM

## 2021-08-29 DIAGNOSIS — C641 Malignant neoplasm of right kidney, except renal pelvis: Secondary | ICD-10-CM

## 2021-08-29 DIAGNOSIS — Z79899 Other long term (current) drug therapy: Secondary | ICD-10-CM | POA: Diagnosis not present

## 2021-08-29 DIAGNOSIS — Z5112 Encounter for antineoplastic immunotherapy: Secondary | ICD-10-CM | POA: Diagnosis not present

## 2021-08-29 DIAGNOSIS — C649 Malignant neoplasm of unspecified kidney, except renal pelvis: Secondary | ICD-10-CM | POA: Diagnosis not present

## 2021-08-29 LAB — CBC AND DIFFERENTIAL
HCT: 46 (ref 36–46)
Hemoglobin: 14.9 (ref 12.0–16.0)
Neutrophils Absolute: 3.83
Platelets: 187 (ref 150–399)
WBC: 6.6

## 2021-08-29 LAB — BASIC METABOLIC PANEL
BUN: 17 (ref 4–21)
CO2: 26 — AB (ref 13–22)
Chloride: 106 (ref 99–108)
Creatinine: 0.8 (ref 0.5–1.1)
Glucose: 122
Potassium: 4 (ref 3.4–5.3)
Sodium: 141 (ref 137–147)

## 2021-08-29 LAB — COMPREHENSIVE METABOLIC PANEL
Albumin: 4.5 (ref 3.5–5.0)
Calcium: 8.8 (ref 8.7–10.7)

## 2021-08-29 LAB — HEPATIC FUNCTION PANEL
ALT: 33 (ref 7–35)
AST: 35 (ref 13–35)
Alkaline Phosphatase: 76 (ref 25–125)
Bilirubin, Total: 0.6

## 2021-08-29 LAB — CBC: RBC: 4.77 (ref 3.87–5.11)

## 2021-08-29 LAB — TSH: TSH: 1.343 u[IU]/mL (ref 0.350–4.500)

## 2021-08-29 MED FILL — Nivolumab IV Soln 100 MG/10ML: INTRAVENOUS | Qty: 48 | Status: AC

## 2021-08-29 NOTE — Telephone Encounter (Signed)
Per 9/7 [place] next appt scheduled and given to patient

## 2021-08-30 ENCOUNTER — Inpatient Hospital Stay: Payer: Medicare PPO

## 2021-08-30 ENCOUNTER — Other Ambulatory Visit: Payer: Self-pay

## 2021-08-30 DIAGNOSIS — C641 Malignant neoplasm of right kidney, except renal pelvis: Secondary | ICD-10-CM | POA: Diagnosis not present

## 2021-08-30 DIAGNOSIS — Z79899 Other long term (current) drug therapy: Secondary | ICD-10-CM | POA: Diagnosis not present

## 2021-08-30 DIAGNOSIS — D3001 Benign neoplasm of right kidney: Secondary | ICD-10-CM

## 2021-08-30 DIAGNOSIS — Z5112 Encounter for antineoplastic immunotherapy: Secondary | ICD-10-CM | POA: Diagnosis not present

## 2021-08-30 LAB — T4: T4, Total: 5.5 ug/dL (ref 4.5–12.0)

## 2021-08-30 MED ORDER — HEPARIN SOD (PORK) LOCK FLUSH 100 UNIT/ML IV SOLN
500.0000 [IU] | Freq: Once | INTRAVENOUS | Status: AC | PRN
  Administered 2021-08-30: 500 [IU]

## 2021-08-30 MED ORDER — SODIUM CHLORIDE 0.9% FLUSH
10.0000 mL | INTRAVENOUS | Status: DC | PRN
  Administered 2021-08-30: 10 mL

## 2021-08-30 MED ORDER — SODIUM CHLORIDE 0.9 % IV SOLN
480.0000 mg | Freq: Once | INTRAVENOUS | Status: AC
  Administered 2021-08-30: 480 mg via INTRAVENOUS
  Filled 2021-08-30: qty 48

## 2021-08-30 MED ORDER — SODIUM CHLORIDE 0.9 % IV SOLN: Freq: Once | INTRAVENOUS | Status: AC

## 2021-08-30 NOTE — Patient Instructions (Signed)
Nivolumab injection What is this medication? NIVOLUMAB (nye VOL ue mab) is a monoclonal antibody. It treats certain types of cancer. Some of the cancers treated are colon cancer, head and neck cancer, Hodgkin lymphoma, lung cancer, and melanoma. This medicine may be used for other purposes; ask your health care provider or pharmacist if you have questions. COMMON BRAND NAME(S): Opdivo What should I tell my care team before I take this medication? They need to know if you have any of these conditions: autoimmune diseases like Crohn's disease, ulcerative colitis, or lupus have had or planning to have an allogeneic stem cell transplant (uses someone else's stem cells) history of chest radiation history of organ transplant nervous system problems like myasthenia gravis or Guillain-Barre syndrome an unusual or allergic reaction to nivolumab, other medicines, foods, dyes, or preservatives pregnant or trying to get pregnant breast-feeding How should I use this medication? This medicine is for infusion into a vein. It is given by a health care professional in a hospital or clinic setting. A special MedGuide will be given to you before each treatment. Be sure to read this information carefully each time. Talk to your pediatrician regarding the use of this medicine in children. While this drug may be prescribed for children as young as 12 years for selected conditions, precautions do apply. Overdosage: If you think you have taken too much of this medicine contact a poison control center or emergency room at once. NOTE: This medicine is only for you. Do not share this medicine with others. What if I miss a dose? It is important not to miss your dose. Call your doctor or health care professional if you are unable to keep an appointment. What may interact with this medication? Interactions have not been studied. This list may not describe all possible interactions. Give your health care provider a list  of all the medicines, herbs, non-prescription drugs, or dietary supplements you use. Also tell them if you smoke, drink alcohol, or use illegal drugs. Some items may interact with your medicine. What should I watch for while using this medication? This drug may make you feel generally unwell. Continue your course of treatment even though you feel ill unless your doctor tells you to stop. You may need blood work done while you are taking this medicine. Do not become pregnant while taking this medicine or for 5 months after stopping it. Women should inform their doctor if they wish to become pregnant or think they might be pregnant. There is a potential for serious side effects to an unborn child. Talk to your health care professional or pharmacist for more information. Do not breast-feed an infant while taking this medicine or for 5 months after stopping it. What side effects may I notice from receiving this medication? Side effects that you should report to your doctor or health care professional as soon as possible: allergic reactions like skin rash, itching or hives, swelling of the face, lips, or tongue breathing problems blood in the urine bloody or watery diarrhea or black, tarry stools changes in emotions or moods changes in vision chest pain cough dizziness feeling faint or lightheaded, falls fever, chills headache with fever, neck stiffness, confusion, loss of memory, sensitivity to light, hallucination, loss of contact with reality, or seizures joint pain mouth sores redness, blistering, peeling or loosening of the skin, including inside the mouth severe muscle pain or weakness signs and symptoms of high blood sugar such as dizziness; dry mouth; dry skin; fruity breath; nausea; stomach  pain; increased hunger or thirst; increased urination signs and symptoms of kidney injury like trouble passing urine or change in the amount of urine signs and symptoms of liver injury like dark  yellow or brown urine; general ill feeling or flu-like symptoms; light-colored stools; loss of appetite; nausea; right upper belly pain; unusually weak or tired; yellowing of the eyes or skin swelling of the ankles, feet, hands trouble passing urine or change in the amount of urine unusually weak or tired weight gain or loss Side effects that usually do not require medical attention (report to your doctor or health care professional if they continue or are bothersome): bone pain constipation decreased appetite diarrhea muscle pain nausea, vomiting tiredness This list may not describe all possible side effects. Call your doctor for medical advice about side effects. You may report side effects to FDA at 1-800-FDA-1088. Where should I keep my medication? This drug is given in a hospital or clinic and will not be stored at home. NOTE: This sheet is a summary. It may not cover all possible information. If you have questions about this medicine, talk to your doctor, pharmacist, or health care provider.  2022 Elsevier/Gold Standard (2020-04-12 10:08:25)

## 2021-09-03 DIAGNOSIS — H8109 Meniere's disease, unspecified ear: Secondary | ICD-10-CM | POA: Diagnosis not present

## 2021-09-03 DIAGNOSIS — J3089 Other allergic rhinitis: Secondary | ICD-10-CM | POA: Diagnosis not present

## 2021-09-07 ENCOUNTER — Encounter: Payer: Self-pay | Admitting: Oncology

## 2021-09-19 NOTE — Progress Notes (Signed)
[facility]  [address] [place],  [place]  [address] [phone]  Clinic Day:  09/26/2021  Referring physician: Mateo Flow, MD  This document serves as a record of services personally performed by Dequincy Macarthur Critchley, MD. It was created on their behalf by [facility] E, a trained medical scribe. The creation of this record is based on the scribe's personal observations and the provider's statements to them.  HISTORY OF PRESENT ILLNESS:  The patient is a 70 y.o. female with metastatic renal cell carcinoma, proven per a right-sided thoracentesis in early December 2021.  She comes in today prior to her 6th cycle of maintenance nivolumab.  The patient states that she tolerated her 5th cycle well without having any significant difficulties.  She still has an intermittently nonproductive cough, particularly at night. She does have GERD, for which she now believes is the major reason behind her nighttime cough.  Of note, this patient's maintenance nivolumab was preceded by 3 cycles of nivolumab/ipilimumab; a 4th cycle was not given due to her being hospitalized for severe colitis related to the ipilimumab component of her combination immunotherapy.   PHYSICAL EXAM:  Blood pressure (!) 166/74, pulse 65, temperature 97.8 F (36.6 C), resp. rate 16, height 5\' 4"  (1.626 m), weight 148 lb 9.6 oz (67.4 kg), SpO2 98 %. Wt Readings from Last 3 Encounters:  09/26/21 148 lb 9.6 oz (67.4 kg)  08/29/21 147 lb 1.6 oz (66.7 kg)  08/02/21 141 lb 1.3 oz (64 kg)   Body mass index is 25.51 kg/m. Performance status (ECOG): 0 Physical Exam Constitutional:      Appearance: Normal appearance. She is not ill-appearing (She continues to look physically better vs previous visits).     Comments:    HENT:     Mouth/Throat:     Mouth: Mucous membranes are moist.     Pharynx: Oropharynx is clear. No oropharyngeal exudate or posterior oropharyngeal erythema.  Cardiovascular:      Rate and Rhythm: Normal rate and regular rhythm.     Heart sounds: No murmur heard.   No friction rub. No gallop.  Pulmonary:     Effort: Pulmonary effort is normal. No respiratory distress.     Breath sounds: No wheezing, rhonchi or rales.  Abdominal:     General: Bowel sounds are normal. There is no distension.     Palpations: Abdomen is soft. There is no mass.     Tenderness: There is no abdominal tenderness.  Musculoskeletal:        General: No swelling.     Right lower leg: No edema.     Left lower leg: No edema.  Lymphadenopathy:     Cervical: No cervical adenopathy.     Upper Body:     Right upper body: No supraclavicular or axillary adenopathy.     Left upper body: No supraclavicular or axillary adenopathy.     Lower Body: No right inguinal adenopathy. No left inguinal adenopathy.  Skin:    General: Skin is warm.     Coloration: Skin is not jaundiced.     Findings: No lesion or rash.  Neurological:     General: No focal deficit present.     Mental Status: She is alert and oriented to person, place, and time. Mental status is at baseline.     Cranial Nerves: Cranial nerves are intact.  Psychiatric:        Mood and Affect: Mood normal.  Behavior: Behavior normal.        Thought Content: Thought content normal.    LABS:    Ref. Range 09/26/2021 00:00  Sodium Latest Ref Range: 137 - 147  139  Potassium Latest Ref Range: 3.4 - 5.3  4.0  Chloride Latest Ref Range: 99 - 108  105  CO2 Latest Ref Range: 13 - 22  24 (A)  Glucose Unknown 125  BUN Latest Ref Range: 4 - 21  13  Creatinine Latest Ref Range: 0.5 - 1.1  0.8  Calcium Latest Ref Range: 8.7 - 10.7  9.0  Alkaline Phosphatase Latest Ref Range: 25 - 125  73  Albumin Latest Ref Range: 3.5 - 5.0  4.3  AST Latest Ref Range: 13 - 35  36 (A)  ALT Latest Ref Range: 7 - 35  32  Bilirubin, Total Unknown 0.5  WBC Unknown 6.5  RBC Latest Ref Range: 3.87 - 5.11  4.53  Hemoglobin Latest Ref Range: 12.0 - 16.0  14.4   HCT Latest Ref Range: 36 - 46  44  Platelets Latest Ref Range: 150 - 399  194  NEUT# Unknown 3.84   ASSESSMENT & PLAN:  A 70 y.o. female with metastatic renal cell carcinoma.  She will proceed with her 6th cycle of maintenance nivolumab tomorrow, which she is receiving every 4 weeks. With respect to her intermittent nighttime cough, I once again recommended that she see a GI doctor to evaluate this.  She is not willing to see them at this time.  Overall, she clinically appears to be doing well.  I will see her back in 4 weeks before she heads into her 7th cycle of maintenance nivolumab.  The patient understands all the plans discussed today and is in agreement with them.    I, Rita Ohara, am acting as scribe for Marice Potter, MD    I have reviewed this report as typed by the medical scribe, and it is complete and accurate.  Dequincy Macarthur Critchley, MD

## 2021-09-25 ENCOUNTER — Encounter: Payer: Self-pay | Admitting: Hematology and Oncology

## 2021-09-25 ENCOUNTER — Encounter: Payer: Self-pay | Admitting: Oncology

## 2021-09-26 ENCOUNTER — Telehealth: Payer: Self-pay | Admitting: Oncology

## 2021-09-26 ENCOUNTER — Inpatient Hospital Stay: Payer: Medicare PPO | Attending: Oncology | Admitting: Oncology

## 2021-09-26 ENCOUNTER — Inpatient Hospital Stay: Payer: Medicare PPO

## 2021-09-26 ENCOUNTER — Other Ambulatory Visit: Payer: Self-pay | Admitting: Hematology and Oncology

## 2021-09-26 VITALS — BP 166/74 | HR 65 | Temp 97.8°F | Resp 16 | Ht 64.0 in | Wt 148.6 lb

## 2021-09-26 DIAGNOSIS — Z5112 Encounter for antineoplastic immunotherapy: Secondary | ICD-10-CM | POA: Insufficient documentation

## 2021-09-26 DIAGNOSIS — D649 Anemia, unspecified: Secondary | ICD-10-CM | POA: Diagnosis not present

## 2021-09-26 DIAGNOSIS — C641 Malignant neoplasm of right kidney, except renal pelvis: Secondary | ICD-10-CM

## 2021-09-26 DIAGNOSIS — Z79899 Other long term (current) drug therapy: Secondary | ICD-10-CM | POA: Insufficient documentation

## 2021-09-26 DIAGNOSIS — D3001 Benign neoplasm of right kidney: Secondary | ICD-10-CM

## 2021-09-26 DIAGNOSIS — C649 Malignant neoplasm of unspecified kidney, except renal pelvis: Secondary | ICD-10-CM | POA: Diagnosis not present

## 2021-09-26 LAB — BASIC METABOLIC PANEL
BUN: 13 (ref 4–21)
CO2: 24 — AB (ref 13–22)
Chloride: 105 (ref 99–108)
Creatinine: 0.8 (ref 0.5–1.1)
Glucose: 125
Potassium: 4 (ref 3.4–5.3)
Sodium: 139 (ref 137–147)

## 2021-09-26 LAB — COMPREHENSIVE METABOLIC PANEL
Albumin: 4.3 (ref 3.5–5.0)
Calcium: 9 (ref 8.7–10.7)

## 2021-09-26 LAB — CBC AND DIFFERENTIAL
HCT: 44 (ref 36–46)
Hemoglobin: 14.4 (ref 12.0–16.0)
Neutrophils Absolute: 3.84
Platelets: 194 (ref 150–399)
WBC: 6.5

## 2021-09-26 LAB — TSH: TSH: 1.571 u[IU]/mL (ref 0.350–4.500)

## 2021-09-26 LAB — HEPATIC FUNCTION PANEL
ALT: 32 (ref 7–35)
AST: 36 — AB (ref 13–35)
Alkaline Phosphatase: 73 (ref 25–125)
Bilirubin, Total: 0.5

## 2021-09-26 LAB — CBC: RBC: 4.53 (ref 3.87–5.11)

## 2021-09-26 MED FILL — Nivolumab IV Soln 100 MG/10ML: INTRAVENOUS | Qty: 48 | Status: AC

## 2021-09-26 NOTE — Telephone Encounter (Signed)
Per 10/5 LOS NEXT APPT SCHEDULED AND GIVEN TO PATIENT

## 2021-09-27 ENCOUNTER — Other Ambulatory Visit: Payer: Self-pay

## 2021-09-27 ENCOUNTER — Inpatient Hospital Stay: Payer: Medicare PPO

## 2021-09-27 VITALS — BP 155/70 | HR 77 | Temp 97.4°F | Resp 16 | Ht 64.0 in | Wt 147.1 lb

## 2021-09-27 DIAGNOSIS — C641 Malignant neoplasm of right kidney, except renal pelvis: Secondary | ICD-10-CM | POA: Diagnosis not present

## 2021-09-27 DIAGNOSIS — Z5112 Encounter for antineoplastic immunotherapy: Secondary | ICD-10-CM | POA: Diagnosis not present

## 2021-09-27 DIAGNOSIS — Z79899 Other long term (current) drug therapy: Secondary | ICD-10-CM | POA: Diagnosis not present

## 2021-09-27 DIAGNOSIS — D3001 Benign neoplasm of right kidney: Secondary | ICD-10-CM

## 2021-09-27 MED ORDER — SODIUM CHLORIDE 0.9 % IV SOLN
480.0000 mg | Freq: Once | INTRAVENOUS | Status: AC
  Administered 2021-09-27: 480 mg via INTRAVENOUS
  Filled 2021-09-27: qty 48

## 2021-09-27 MED ORDER — HEPARIN SOD (PORK) LOCK FLUSH 100 UNIT/ML IV SOLN
500.0000 [IU] | Freq: Once | INTRAVENOUS | Status: AC | PRN
  Administered 2021-09-27: 500 [IU]

## 2021-09-27 MED ORDER — SODIUM CHLORIDE 0.9% FLUSH
10.0000 mL | INTRAVENOUS | Status: DC | PRN
  Administered 2021-09-27: 10 mL

## 2021-09-27 MED ORDER — SODIUM CHLORIDE 0.9 % IV SOLN: Freq: Once | INTRAVENOUS | Status: AC

## 2021-09-27 NOTE — Progress Notes (Signed)
Discharged home, stable  

## 2021-09-27 NOTE — Patient Instructions (Signed)
Nivolumab injection What is this medication? NIVOLUMAB (nye VOL ue mab) is a monoclonal antibody. It treats certain types of cancer. Some of the cancers treated are colon cancer, head and neck cancer, Hodgkin lymphoma, lung cancer, and melanoma. This medicine may be used for other purposes; ask your health care provider or pharmacist if you have questions. COMMON BRAND NAME(S): Opdivo What should I tell my care team before I take this medication? They need to know if you have any of these conditions: autoimmune diseases like Crohn's disease, ulcerative colitis, or lupus have had or planning to have an allogeneic stem cell transplant (uses someone else's stem cells) history of chest radiation history of organ transplant nervous system problems like myasthenia gravis or Guillain-Barre syndrome an unusual or allergic reaction to nivolumab, other medicines, foods, dyes, or preservatives pregnant or trying to get pregnant breast-feeding How should I use this medication? This medicine is for infusion into a vein. It is given by a health care professional in a hospital or clinic setting. A special MedGuide will be given to you before each treatment. Be sure to read this information carefully each time. Talk to your pediatrician regarding the use of this medicine in children. While this drug may be prescribed for children as young as 12 years for selected conditions, precautions do apply. Overdosage: If you think you have taken too much of this medicine contact a poison control center or emergency room at once. NOTE: This medicine is only for you. Do not share this medicine with others. What if I miss a dose? It is important not to miss your dose. Call your doctor or health care professional if you are unable to keep an appointment. What may interact with this medication? Interactions have not been studied. This list may not describe all possible interactions. Give your health care provider a list  of all the medicines, herbs, non-prescription drugs, or dietary supplements you use. Also tell them if you smoke, drink alcohol, or use illegal drugs. Some items may interact with your medicine. What should I watch for while using this medication? This drug may make you feel generally unwell. Continue your course of treatment even though you feel ill unless your doctor tells you to stop. You may need blood work done while you are taking this medicine. Do not become pregnant while taking this medicine or for 5 months after stopping it. Women should inform their doctor if they wish to become pregnant or think they might be pregnant. There is a potential for serious side effects to an unborn child. Talk to your health care professional or pharmacist for more information. Do not breast-feed an infant while taking this medicine or for 5 months after stopping it. What side effects may I notice from receiving this medication? Side effects that you should report to your doctor or health care professional as soon as possible: allergic reactions like skin rash, itching or hives, swelling of the face, lips, or tongue breathing problems blood in the urine bloody or watery diarrhea or black, tarry stools changes in emotions or moods changes in vision chest pain cough dizziness feeling faint or lightheaded, falls fever, chills headache with fever, neck stiffness, confusion, loss of memory, sensitivity to light, hallucination, loss of contact with reality, or seizures joint pain mouth sores redness, blistering, peeling or loosening of the skin, including inside the mouth severe muscle pain or weakness signs and symptoms of high blood sugar such as dizziness; dry mouth; dry skin; fruity breath; nausea; stomach  pain; increased hunger or thirst; increased urination signs and symptoms of kidney injury like trouble passing urine or change in the amount of urine signs and symptoms of liver injury like dark  yellow or brown urine; general ill feeling or flu-like symptoms; light-colored stools; loss of appetite; nausea; right upper belly pain; unusually weak or tired; yellowing of the eyes or skin swelling of the ankles, feet, hands trouble passing urine or change in the amount of urine unusually weak or tired weight gain or loss Side effects that usually do not require medical attention (report to your doctor or health care professional if they continue or are bothersome): bone pain constipation decreased appetite diarrhea muscle pain nausea, vomiting tiredness This list may not describe all possible side effects. Call your doctor for medical advice about side effects. You may report side effects to FDA at 1-800-FDA-1088. Where should I keep my medication? This drug is given in a hospital or clinic and will not be stored at home. NOTE: This sheet is a summary. It may not cover all possible information. If you have questions about this medicine, talk to your doctor, pharmacist, or health care provider.  2022 Elsevier/Gold Standard (2020-04-12 10:08:25) [facility]  Discharge Instructions: Thank you for choosing [facility] to provide your oncology and hematology care.  If you have a lab appointment with the [facility], please go directly to the [facility] and check in at the registration area.   Wear comfortable clothing and clothing appropriate for easy access to any Portacath or PICC line.   We strive to give you quality time with your provider. You may need to reschedule your appointment if you arrive late (15 or more minutes).  Arriving late affects you and other patients whose appointments are after yours.  Also, if you miss three or more appointments without notifying the office, you may be dismissed from the clinic at the provider's discretion.      For prescription refill requests, have your pharmacy contact our office and allow 72 hours for  refills to be completed.    Today you received the following chemotherapy and/or immunotherapy agents:OPDIVO   To help prevent nausea and vomiting after your treatment, we encourage you to take your nausea medication as directed.  BELOW ARE SYMPTOMS THAT SHOULD BE REPORTED IMMEDIATELY: *FEVER GREATER THAN 100.4 F (38 C) OR HIGHER *CHILLS OR SWEATING *NAUSEA AND VOMITING THAT IS NOT CONTROLLED WITH YOUR NAUSEA MEDICATION *UNUSUAL SHORTNESS OF BREATH *UNUSUAL BRUISING OR BLEEDING *URINARY PROBLEMS (pain or burning when urinating, or frequent urination) *BOWEL PROBLEMS (unusual diarrhea, constipation, pain near the anus) TENDERNESS IN MOUTH AND THROAT WITH OR WITHOUT PRESENCE OF ULCERS (sore throat, sores in mouth, or a toothache) UNUSUAL RASH, SWELLING OR PAIN  UNUSUAL VAGINAL DISCHARGE OR ITCHING   Items with * indicate a potential emergency and should be followed up as soon as possible or go to the Emergency Department if any problems should occur.  Please show the CHEMOTHERAPY ALERT CARD or IMMUNOTHERAPY ALERT CARD at check-in to the Emergency Department and triage nurse.  Should you have questions after your visit or need to cancel or reschedule your appointment, please contact [facility]  Dept: [phone]  and follow the prompts.  Office hours are 8:00 a.m. to 4:30 p.m. Monday - Friday. Please note that voicemails left after 4:00 p.m. may not be returned until the following business day.  We are closed weekends and major holidays.  You have access to a nurse at all times for urgent questions. Please call the main number to the clinic Dept: [phone] and follow the prompts.  For any non-urgent questions, you may also contact your provider using MyChart. We now offer e-Visits for anyone 45 and older to request care online for non-urgent symptoms. For details visit [email or web address].   Also download the MyChart app! Go to the app store, search "MyChart",  open the app, select [facility], and log in with your MyChart username and password.  Due to Covid, a mask is required upon entering the hospital/clinic. If you do not have a mask, one will be given to you upon arrival. For doctor visits, patients may have 1 support person aged 34 or older with them. For treatment visits, patients cannot have anyone with them due to current Covid guidelines and our immunocompromised population.

## 2021-09-28 LAB — T4: T4, Total: 5.2 ug/dL (ref 4.5–12.0)

## 2021-10-17 ENCOUNTER — Encounter: Payer: Self-pay | Admitting: Oncology

## 2021-10-18 NOTE — Progress Notes (Signed)
[facility]  [address] [place],  [place]  [address] [phone]  Clinic Day:  10/24/2021  Referring physician: Mateo Flow, MD  This document serves as a record of services personally performed by Yitzhak Awan Macarthur Critchley, MD. It was created on their behalf by [facility] E, a trained medical scribe. The creation of this record is based on the scribe's personal observations and the provider's statements to them.  HISTORY OF PRESENT ILLNESS:  The patient is a 70 y.o. female with metastatic renal cell carcinoma, proven per a right-sided thoracentesis in early December 2021.  She comes in today prior to her 7th cycle of maintenance nivolumab.  The patient states that she tolerated her 6th cycle well without having any significant difficulties.  Of note, this patient's maintenance nivolumab was preceded by 3 cycles of nivolumab/ipilimumab; a 4th cycle was not given due to her being hospitalized for severe colitis related to the ipilimumab component of her combination immunotherapy.  However, she has had increased fatigue.  She claims to still have a cough and swallowing issues.  We have discussed her seeing GI for a possible esophageal dilatation, but she wishes to defer this until after the Thanksgiving holiday. She denies having any new symptoms/findings which concern her for disease progression.  PHYSICAL EXAM:  Blood pressure (!) 161/74, pulse 60, temperature 97.9 F (36.6 C), resp. rate 14, height 5\' 1"  (1.549 m), weight 149 lb 11.2 oz (67.9 kg), SpO2 99 %. Wt Readings from Last 3 Encounters:  10/24/21 149 lb 11.2 oz (67.9 kg)  09/27/21 147 lb 1.9 oz (66.7 kg)  09/26/21 148 lb 9.6 oz (67.4 kg)   Body mass index is 28.29 kg/m. Performance status (ECOG): 0 Physical Exam Constitutional:      Appearance: Normal appearance. She is not ill-appearing (She continues to look physically better vs previous visits).     Comments:    HENT:     Mouth/Throat:      Mouth: Mucous membranes are moist.     Pharynx: Oropharynx is clear. No oropharyngeal exudate or posterior oropharyngeal erythema.  Cardiovascular:     Rate and Rhythm: Normal rate and regular rhythm.     Heart sounds: No murmur heard.   No friction rub. No gallop.  Pulmonary:     Effort: Pulmonary effort is normal. No respiratory distress.     Breath sounds: No wheezing, rhonchi or rales.  Abdominal:     General: Bowel sounds are normal. There is no distension.     Palpations: Abdomen is soft. There is no mass.     Tenderness: There is no abdominal tenderness.  Musculoskeletal:        General: No swelling.     Right lower leg: No edema.     Left lower leg: No edema.  Lymphadenopathy:     Cervical: No cervical adenopathy.     Upper Body:     Right upper body: No supraclavicular or axillary adenopathy.     Left upper body: No supraclavicular or axillary adenopathy.     Lower Body: No right inguinal adenopathy. No left inguinal adenopathy.  Skin:    General: Skin is warm.     Coloration: Skin is not jaundiced.     Findings: No lesion or rash.  Neurological:     General: No focal deficit present.     Mental Status: She is alert and oriented to person, place, and time. Mental status is at baseline.  Psychiatric:  Mood and Affect: Mood normal.        Behavior: Behavior normal.        Thought Content: Thought content normal.    LABS:     Ref. Range 10/24/2021 00:00  Sodium Latest Ref Range: 137 - 147  142  Potassium Latest Ref Range: 3.4 - 5.3  4.2  Chloride Latest Ref Range: 99 - 108  106  CO2 Latest Ref Range: 13 - 22  26 (A)  Glucose Unknown 110  BUN Latest Ref Range: 4 - 21  12  Creatinine Latest Ref Range: 0.5 - 1.1  0.7  Calcium Latest Ref Range: 8.7 - 10.7  8.8  Alkaline Phosphatase Latest Ref Range: 25 - 125  103  Albumin Latest Ref Range: 3.5 - 5.0  4.3  AST Latest Ref Range: 13 - 35  35  ALT Latest Ref Range: 7 - 35  33  Bilirubin, Total Unknown 0.5  WBC  Unknown 6.1  RBC Latest Ref Range: 3.87 - 5.11  4.42  Hemoglobin Latest Ref Range: 12.0 - 16.0  14.5  HCT Latest Ref Range: 36 - 46  43  Platelets Latest Ref Range: 150 - 399  225  NEUT# Unknown 2.99   ASSESSMENT & PLAN:  A 70 y.o. female with metastatic renal cell carcinoma.  She will proceed with her 7th cycle of maintenance nivolumab tomorrow, which she is receiving every 4 weeks. Overall, she clinically appears to be doing well.  I will see her back in 4 weeks before she heads into her 8th cycle of maintenance nivolumab.  The patient understands all the plans discussed today and is in agreement with them.    I, Rita Ohara, am acting as scribe for Marice Potter, MD    I have reviewed this report as typed by the medical scribe, and it is complete and accurate.  Ricki Vanhandel Macarthur Critchley, MD

## 2021-10-24 ENCOUNTER — Inpatient Hospital Stay: Payer: Medicare PPO

## 2021-10-24 ENCOUNTER — Encounter: Payer: Self-pay | Admitting: Oncology

## 2021-10-24 ENCOUNTER — Inpatient Hospital Stay: Payer: Medicare PPO | Attending: Oncology | Admitting: Oncology

## 2021-10-24 ENCOUNTER — Telehealth: Payer: Self-pay | Admitting: Oncology

## 2021-10-24 VITALS — BP 161/74 | HR 60 | Temp 97.9°F | Resp 14 | Ht 61.0 in | Wt 149.7 lb

## 2021-10-24 DIAGNOSIS — Z79899 Other long term (current) drug therapy: Secondary | ICD-10-CM | POA: Insufficient documentation

## 2021-10-24 DIAGNOSIS — C641 Malignant neoplasm of right kidney, except renal pelvis: Secondary | ICD-10-CM | POA: Diagnosis not present

## 2021-10-24 DIAGNOSIS — C649 Malignant neoplasm of unspecified kidney, except renal pelvis: Secondary | ICD-10-CM | POA: Diagnosis not present

## 2021-10-24 DIAGNOSIS — C799 Secondary malignant neoplasm of unspecified site: Secondary | ICD-10-CM | POA: Diagnosis not present

## 2021-10-24 DIAGNOSIS — D649 Anemia, unspecified: Secondary | ICD-10-CM | POA: Diagnosis not present

## 2021-10-24 DIAGNOSIS — Z5112 Encounter for antineoplastic immunotherapy: Secondary | ICD-10-CM | POA: Diagnosis not present

## 2021-10-24 DIAGNOSIS — D3001 Benign neoplasm of right kidney: Secondary | ICD-10-CM

## 2021-10-24 LAB — BASIC METABOLIC PANEL
BUN: 12 (ref 4–21)
CO2: 26 — AB (ref 13–22)
Chloride: 106 (ref 99–108)
Creatinine: 0.7 (ref 0.5–1.1)
Glucose: 110
Potassium: 4.2 (ref 3.4–5.3)
Sodium: 142 (ref 137–147)

## 2021-10-24 LAB — CBC: RBC: 4.42 (ref 3.87–5.11)

## 2021-10-24 LAB — HEPATIC FUNCTION PANEL
ALT: 33 (ref 7–35)
AST: 35 (ref 13–35)
Alkaline Phosphatase: 103 (ref 25–125)
Bilirubin, Total: 0.5

## 2021-10-24 LAB — COMPREHENSIVE METABOLIC PANEL
Albumin: 4.3 (ref 3.5–5.0)
Calcium: 8.8 (ref 8.7–10.7)

## 2021-10-24 LAB — CBC AND DIFFERENTIAL
HCT: 43 (ref 36–46)
Hemoglobin: 14.5 (ref 12.0–16.0)
Neutrophils Absolute: 2.99
Platelets: 225 (ref 150–399)
WBC: 6.1

## 2021-10-24 LAB — TSH: TSH: 1.929 u[IU]/mL (ref 0.350–4.500)

## 2021-10-24 MED FILL — Nivolumab IV Soln 100 MG/10ML: INTRAVENOUS | Qty: 48 | Status: AC

## 2021-10-24 NOTE — Telephone Encounter (Signed)
Per 11/2 los next appt scheduled and given to patient

## 2021-10-25 ENCOUNTER — Inpatient Hospital Stay: Payer: Medicare PPO

## 2021-10-25 ENCOUNTER — Encounter: Payer: Self-pay | Admitting: Oncology

## 2021-10-25 ENCOUNTER — Other Ambulatory Visit: Payer: Self-pay

## 2021-10-25 VITALS — BP 182/78 | HR 66 | Temp 98.1°F | Resp 18 | Ht 64.0 in | Wt 149.0 lb

## 2021-10-25 DIAGNOSIS — D3001 Benign neoplasm of right kidney: Secondary | ICD-10-CM

## 2021-10-25 DIAGNOSIS — Z79899 Other long term (current) drug therapy: Secondary | ICD-10-CM | POA: Diagnosis not present

## 2021-10-25 DIAGNOSIS — C641 Malignant neoplasm of right kidney, except renal pelvis: Secondary | ICD-10-CM | POA: Diagnosis not present

## 2021-10-25 DIAGNOSIS — Z5112 Encounter for antineoplastic immunotherapy: Secondary | ICD-10-CM | POA: Diagnosis not present

## 2021-10-25 DIAGNOSIS — C799 Secondary malignant neoplasm of unspecified site: Secondary | ICD-10-CM | POA: Diagnosis not present

## 2021-10-25 LAB — T4: T4, Total: 5.7 ug/dL (ref 4.5–12.0)

## 2021-10-25 MED ORDER — HEPARIN SOD (PORK) LOCK FLUSH 100 UNIT/ML IV SOLN
500.0000 [IU] | Freq: Once | INTRAVENOUS | Status: AC | PRN
  Administered 2021-10-25: 500 [IU]

## 2021-10-25 MED ORDER — SODIUM CHLORIDE 0.9 % IV SOLN: Freq: Once | INTRAVENOUS | Status: AC

## 2021-10-25 MED ORDER — SODIUM CHLORIDE 0.9 % IV SOLN
480.0000 mg | Freq: Once | INTRAVENOUS | Status: AC
  Administered 2021-10-25: 480 mg via INTRAVENOUS
  Filled 2021-10-25: qty 48

## 2021-10-25 MED ORDER — SODIUM CHLORIDE 0.9% FLUSH
10.0000 mL | INTRAVENOUS | Status: DC | PRN
  Administered 2021-10-25: 10 mL

## 2021-10-25 NOTE — Patient Instructions (Signed)
Nivolumab injection What is this medication? NIVOLUMAB (nye VOL ue mab) is a monoclonal antibody. It treats certain types of cancer. Some of the cancers treated are colon cancer, head and neck cancer, Hodgkin lymphoma, lung cancer, and melanoma. This medicine may be used for other purposes; ask your health care provider or pharmacist if you have questions. COMMON BRAND NAME(S): Opdivo What should I tell my care team before I take this medication? They need to know if you have any of these conditions: autoimmune diseases like Crohn's disease, ulcerative colitis, or lupus have had or planning to have an allogeneic stem cell transplant (uses someone else's stem cells) history of chest radiation history of organ transplant nervous system problems like myasthenia gravis or Guillain-Barre syndrome an unusual or allergic reaction to nivolumab, other medicines, foods, dyes, or preservatives pregnant or trying to get pregnant breast-feeding How should I use this medication? This medicine is for infusion into a vein. It is given by a health care professional in a hospital or clinic setting. A special MedGuide will be given to you before each treatment. Be sure to read this information carefully each time. Talk to your pediatrician regarding the use of this medicine in children. While this drug may be prescribed for children as young as 12 years for selected conditions, precautions do apply. Overdosage: If you think you have taken too much of this medicine contact a poison control center or emergency room at once. NOTE: This medicine is only for you. Do not share this medicine with others. What if I miss a dose? It is important not to miss your dose. Call your doctor or health care professional if you are unable to keep an appointment. What may interact with this medication? Interactions have not been studied. This list may not describe all possible interactions. Give your health care provider a list  of all the medicines, herbs, non-prescription drugs, or dietary supplements you use. Also tell them if you smoke, drink alcohol, or use illegal drugs. Some items may interact with your medicine. What should I watch for while using this medication? This drug may make you feel generally unwell. Continue your course of treatment even though you feel ill unless your doctor tells you to stop. You may need blood work done while you are taking this medicine. Do not become pregnant while taking this medicine or for 5 months after stopping it. Women should inform their doctor if they wish to become pregnant or think they might be pregnant. There is a potential for serious side effects to an unborn child. Talk to your health care professional or pharmacist for more information. Do not breast-feed an infant while taking this medicine or for 5 months after stopping it. What side effects may I notice from receiving this medication? Side effects that you should report to your doctor or health care professional as soon as possible: allergic reactions like skin rash, itching or hives, swelling of the face, lips, or tongue breathing problems blood in the urine bloody or watery diarrhea or black, tarry stools changes in emotions or moods changes in vision chest pain cough dizziness feeling faint or lightheaded, falls fever, chills headache with fever, neck stiffness, confusion, loss of memory, sensitivity to light, hallucination, loss of contact with reality, or seizures joint pain mouth sores redness, blistering, peeling or loosening of the skin, including inside the mouth severe muscle pain or weakness signs and symptoms of high blood sugar such as dizziness; dry mouth; dry skin; fruity breath; nausea; stomach  pain; increased hunger or thirst; increased urination signs and symptoms of kidney injury like trouble passing urine or change in the amount of urine signs and symptoms of liver injury like dark  yellow or brown urine; general ill feeling or flu-like symptoms; light-colored stools; loss of appetite; nausea; right upper belly pain; unusually weak or tired; yellowing of the eyes or skin swelling of the ankles, feet, hands trouble passing urine or change in the amount of urine unusually weak or tired weight gain or loss Side effects that usually do not require medical attention (report to your doctor or health care professional if they continue or are bothersome): bone pain constipation decreased appetite diarrhea muscle pain nausea, vomiting tiredness This list may not describe all possible side effects. Call your doctor for medical advice about side effects. You may report side effects to FDA at 1-800-FDA-1088. Where should I keep my medication? This drug is given in a hospital or clinic and will not be stored at home. NOTE: This sheet is a summary. It may not cover all possible information. If you have questions about this medicine, talk to your doctor, pharmacist, or health care provider.  2022 Elsevier/Gold Standard (2020-04-12 10:08:25)

## 2021-10-29 DIAGNOSIS — M6528 Calcific tendinitis, other site: Secondary | ICD-10-CM | POA: Diagnosis not present

## 2021-10-29 DIAGNOSIS — M722 Plantar fascial fibromatosis: Secondary | ICD-10-CM | POA: Diagnosis not present

## 2021-11-12 NOTE — Progress Notes (Signed)
[facility]  [address] [place],  [place]  [address] [phone]  Clinic Day:  11/21/2021  Referring physician: Mateo Flow, MD  This document serves as a record of services personally performed by Bonney Berres Macarthur Critchley, MD. It was created on their behalf by [facility] E, a trained medical scribe. The creation of this record is based on the scribe's personal observations and the provider's statements to them.  HISTORY OF PRESENT ILLNESS:  The patient is a 70 y.o. female with metastatic renal cell carcinoma, proven per a right-sided thoracentesis in early December 2021.  She comes in today prior to her 8th cycle of maintenance nivolumab.  The patient states that she tolerated her 7th cycle well without having any significant difficulties.  Of note, this patient's maintenance nivolumab was preceded by 3 cycles of nivolumab/ipilimumab; a 4th cycle was not given due to her being hospitalized for severe colitis related to the ipilimumab component of her combination immunotherapy.  She claims to still have a cough and swallowing issues, but they have not gotten worse over these past weeks/months.  She denies having any new symptoms/findings which concern her for disease progression.  PHYSICAL EXAM:  Blood pressure 139/75, pulse 68, temperature 98.3 F (36.8 C), resp. rate 16, height 5\' 1"  (1.549 m), weight 148 lb 11.2 oz (67.4 kg), SpO2 96 %. Wt Readings from Last 3 Encounters:  11/21/21 148 lb 11.2 oz (67.4 kg)  10/25/21 149 lb (67.6 kg)  10/24/21 149 lb 11.2 oz (67.9 kg)   Body mass index is 28.1 kg/m. Performance status (ECOG): 0 Physical Exam Constitutional:      Appearance: Normal appearance. She is not ill-appearing (She continues to look physically better vs previous visits).     Comments:    HENT:     Mouth/Throat:     Mouth: Mucous membranes are moist.     Pharynx: Oropharynx is clear. No oropharyngeal exudate or posterior oropharyngeal  erythema.  Cardiovascular:     Rate and Rhythm: Normal rate and regular rhythm.     Heart sounds: No murmur heard.   No friction rub. No gallop.  Pulmonary:     Effort: Pulmonary effort is normal. No respiratory distress.     Breath sounds: No wheezing, rhonchi or rales.  Abdominal:     General: Bowel sounds are normal. There is no distension.     Palpations: Abdomen is soft. There is no mass.     Tenderness: There is no abdominal tenderness.  Musculoskeletal:        General: No swelling.     Right lower leg: No edema.     Left lower leg: No edema.  Lymphadenopathy:     Cervical: No cervical adenopathy.     Upper Body:     Right upper body: No supraclavicular or axillary adenopathy.     Left upper body: No supraclavicular or axillary adenopathy.     Lower Body: No right inguinal adenopathy. No left inguinal adenopathy.  Skin:    General: Skin is warm.     Coloration: Skin is not jaundiced.     Findings: Rash (facial rash seen) present. No lesion.  Neurological:     General: No focal deficit present.     Mental Status: She is alert and oriented to person, place, and time. Mental status is at baseline.  Psychiatric:        Mood and Affect: Mood normal.        Behavior: Behavior normal.  Thought Content: Thought content normal.    LABS:    Latest Reference Range & Units 11/21/21 00:00  Sodium 137 - 147  142 (E)  Potassium 3.4 - 5.3  4.0 (E)  Chloride 99 - 108  107 (E)  CO2 13 - 22  26 ! (E)  Glucose  120 (E)  BUN 4 - 21  13 (E)  Creatinine 0.5 - 1.1  0.7 (E)  Calcium 8.7 - 10.7  8.9 (E)  Alkaline Phosphatase 25 - 125  91 (E)  Albumin 3.5 - 5.0  4.3 (E)  AST 13 - 35  38 ! (E)  ALT 7 - 35  36 ! (E)  Bilirubin, Total  0.6 (E)  WBC  7.2 (E)  RBC 3.87 - 5.11  4.52 (E)  Hemoglobin 12.0 - 16.0  14.8 (E)  HCT 36 - 46  44 (E)  Platelets 150 - 399  229 (E)  NEUT#  4.61 (E)    ASSESSMENT & PLAN:  A 70 y.o. female with metastatic renal cell carcinoma.  She will  proceed with her 8th cycle of maintenance nivolumab tomorrow, which she is receiving every 4 weeks. Overall, she clinically appears to be doing well.  I will see her back in 4 weeks before she heads into her 9th cycle of maintenance nivolumab.  CT scans will be done a day before her next visit to ascertain her new disease baseline after 8 cycles of maintenance nivolumab immunotherapy.  With respect to the skin rash on her face, I do believe it is related to her immunotherapy.  I told her to use hydrocortisone cream to see if will help dissipate the rash in question.  The patient understands all the plans discussed today and is in agreement with them.    I, Rita Ohara, am acting as scribe for Marice Potter, MD    I have reviewed this report as typed by the medical scribe, and it is complete and accurate.  Howard Patton Macarthur Critchley, MD

## 2021-11-21 ENCOUNTER — Telehealth: Payer: Self-pay | Admitting: Oncology

## 2021-11-21 ENCOUNTER — Inpatient Hospital Stay: Payer: Medicare PPO

## 2021-11-21 ENCOUNTER — Inpatient Hospital Stay: Payer: Medicare PPO | Admitting: Oncology

## 2021-11-21 ENCOUNTER — Other Ambulatory Visit: Payer: Self-pay | Admitting: Oncology

## 2021-11-21 ENCOUNTER — Other Ambulatory Visit: Payer: Self-pay | Admitting: Hematology and Oncology

## 2021-11-21 VITALS — BP 139/75 | HR 68 | Temp 98.3°F | Resp 16 | Ht 61.0 in | Wt 148.7 lb

## 2021-11-21 DIAGNOSIS — C799 Secondary malignant neoplasm of unspecified site: Secondary | ICD-10-CM | POA: Diagnosis not present

## 2021-11-21 DIAGNOSIS — C641 Malignant neoplasm of right kidney, except renal pelvis: Secondary | ICD-10-CM

## 2021-11-21 DIAGNOSIS — Z79899 Other long term (current) drug therapy: Secondary | ICD-10-CM | POA: Diagnosis not present

## 2021-11-21 DIAGNOSIS — Z5112 Encounter for antineoplastic immunotherapy: Secondary | ICD-10-CM | POA: Diagnosis not present

## 2021-11-21 DIAGNOSIS — D3001 Benign neoplasm of right kidney: Secondary | ICD-10-CM

## 2021-11-21 DIAGNOSIS — C649 Malignant neoplasm of unspecified kidney, except renal pelvis: Secondary | ICD-10-CM | POA: Diagnosis not present

## 2021-11-21 LAB — BASIC METABOLIC PANEL WITH GFR
BUN: 13 (ref 4–21)
CO2: 26 — AB (ref 13–22)
Chloride: 107 (ref 99–108)
Creatinine: 0.7 (ref 0.5–1.1)
Glucose: 120
Potassium: 4 (ref 3.4–5.3)
Sodium: 142 (ref 137–147)

## 2021-11-21 LAB — HEPATIC FUNCTION PANEL
ALT: 36 — AB (ref 7–35)
AST: 38 — AB (ref 13–35)
Alkaline Phosphatase: 91 (ref 25–125)
Bilirubin, Total: 0.6

## 2021-11-21 LAB — CBC AND DIFFERENTIAL
HCT: 44 (ref 36–46)
Hemoglobin: 14.8 (ref 12.0–16.0)
Neutrophils Absolute: 4.61
Platelets: 229 (ref 150–399)
WBC: 7.2

## 2021-11-21 LAB — TSH: TSH: 1.448 u[IU]/mL (ref 0.350–4.500)

## 2021-11-21 LAB — COMPREHENSIVE METABOLIC PANEL WITH GFR
Albumin: 4.3 (ref 3.5–5.0)
Calcium: 8.9 (ref 8.7–10.7)

## 2021-11-21 LAB — CBC: RBC: 4.52 (ref 3.87–5.11)

## 2021-11-21 MED FILL — Nivolumab IV Soln 100 MG/10ML: INTRAVENOUS | Qty: 48 | Status: AC

## 2021-11-21 NOTE — Telephone Encounter (Signed)
Per 11/30 los next appt scheduled and confirmed with patient

## 2021-11-22 ENCOUNTER — Other Ambulatory Visit: Payer: Self-pay

## 2021-11-22 ENCOUNTER — Inpatient Hospital Stay: Payer: Medicare PPO | Attending: Oncology

## 2021-11-22 VITALS — BP 161/67 | HR 64 | Temp 97.5°F | Resp 15 | Ht 61.0 in | Wt 150.8 lb

## 2021-11-22 DIAGNOSIS — Z5112 Encounter for antineoplastic immunotherapy: Secondary | ICD-10-CM | POA: Diagnosis not present

## 2021-11-22 DIAGNOSIS — D3001 Benign neoplasm of right kidney: Secondary | ICD-10-CM

## 2021-11-22 DIAGNOSIS — Z79899 Other long term (current) drug therapy: Secondary | ICD-10-CM | POA: Insufficient documentation

## 2021-11-22 DIAGNOSIS — C641 Malignant neoplasm of right kidney, except renal pelvis: Secondary | ICD-10-CM | POA: Insufficient documentation

## 2021-11-22 LAB — T4: T4, Total: 6.2 ug/dL (ref 4.5–12.0)

## 2021-11-22 MED ORDER — HEPARIN SOD (PORK) LOCK FLUSH 100 UNIT/ML IV SOLN
500.0000 [IU] | Freq: Once | INTRAVENOUS | Status: AC | PRN
  Administered 2021-11-22: 500 [IU]

## 2021-11-22 MED ORDER — SODIUM CHLORIDE 0.9% FLUSH
10.0000 mL | INTRAVENOUS | Status: DC | PRN
  Administered 2021-11-22: 10 mL

## 2021-11-22 MED ORDER — SODIUM CHLORIDE 0.9 % IV SOLN
480.0000 mg | Freq: Once | INTRAVENOUS | Status: AC
  Administered 2021-11-22: 480 mg via INTRAVENOUS
  Filled 2021-11-22: qty 48

## 2021-11-22 MED ORDER — SODIUM CHLORIDE 0.9 % IV SOLN: Freq: Once | INTRAVENOUS | Status: AC

## 2021-11-22 NOTE — Patient Instructions (Signed)
[facility]  Discharge Instructions: Thank you for choosing [facility] to provide your oncology and hematology care.  If you have a lab appointment with the [facility], please go directly to the [facility] and check in at the registration area.   Wear comfortable clothing and clothing appropriate for easy access to any Portacath or PICC line.   We strive to give you quality time with your provider. You may need to reschedule your appointment if you arrive late (15 or more minutes).  Arriving late affects you and other patients whose appointments are after yours.  Also, if you miss three or more appointments without notifying the office, you may be dismissed from the clinic at the provider's discretion.      For prescription refill requests, have your pharmacy contact our office and allow 72 hours for refills to be completed.    Today you received the following chemotherapy and/or immunotherapy agents Nivolumab   To help prevent nausea and vomiting after your treatment, we encourage you to take your nausea medication as directed.  BELOW ARE SYMPTOMS THAT SHOULD BE REPORTED IMMEDIATELY: *FEVER GREATER THAN 100.4 F (38 C) OR HIGHER *CHILLS OR SWEATING *NAUSEA AND VOMITING THAT IS NOT CONTROLLED WITH YOUR NAUSEA MEDICATION *UNUSUAL SHORTNESS OF BREATH *UNUSUAL BRUISING OR BLEEDING *URINARY PROBLEMS (pain or burning when urinating, or frequent urination) *BOWEL PROBLEMS (unusual diarrhea, constipation, pain near the anus) TENDERNESS IN MOUTH AND THROAT WITH OR WITHOUT PRESENCE OF ULCERS (sore throat, sores in mouth, or a toothache) UNUSUAL RASH, SWELLING OR PAIN  UNUSUAL VAGINAL DISCHARGE OR ITCHING   Items with * indicate a potential emergency and should be followed up as soon as possible or go to the Emergency Department if any problems should occur.  Please show the CHEMOTHERAPY ALERT CARD or IMMUNOTHERAPY ALERT CARD at check-in to the  Emergency Department and triage nurse.  Should you have questions after your visit or need to cancel or reschedule your appointment, please contact [facility]  Dept: [phone]  and follow the prompts.  Office hours are 8:00 a.m. to 4:30 p.m. Monday - Friday. Please note that voicemails left after 4:00 p.m. may not be returned until the following business day.  We are closed weekends and major holidays. You have access to a nurse at all times for urgent questions. Please call the main number to the clinic Dept: [phone] and follow the prompts.  For any non-urgent questions, you may also contact your provider using MyChart. We now offer e-Visits for anyone 25 and older to request care online for non-urgent symptoms. For details visit [email or web address].   Also download the MyChart app! Go to the app store, search "MyChart", open the app, select [facility], and log in with your MyChart username and password.  Due to Covid, a mask is required upon entering the hospital/clinic. If you do not have a mask, one will be given to you upon arrival. For doctor visits, patients may have 1 support person aged 64 or older with them. For treatment visits, patients cannot have anyone with them due to current Covid guidelines and our immunocompromised population.

## 2021-11-22 NOTE — Progress Notes (Signed)
1123: PT STABLE AT TIME OF DISCHARGE

## 2021-11-27 ENCOUNTER — Encounter: Payer: Self-pay | Admitting: Oncology

## 2021-12-11 ENCOUNTER — Encounter: Payer: Self-pay | Admitting: Oncology

## 2021-12-18 DIAGNOSIS — J90 Pleural effusion, not elsewhere classified: Secondary | ICD-10-CM | POA: Diagnosis not present

## 2021-12-18 DIAGNOSIS — I700 Atherosclerosis of aorta: Secondary | ICD-10-CM | POA: Diagnosis not present

## 2021-12-18 DIAGNOSIS — K5730 Diverticulosis of large intestine without perforation or abscess without bleeding: Secondary | ICD-10-CM | POA: Diagnosis not present

## 2021-12-18 DIAGNOSIS — C641 Malignant neoplasm of right kidney, except renal pelvis: Secondary | ICD-10-CM | POA: Diagnosis not present

## 2021-12-18 DIAGNOSIS — J432 Centrilobular emphysema: Secondary | ICD-10-CM | POA: Diagnosis not present

## 2021-12-18 DIAGNOSIS — R946 Abnormal results of thyroid function studies: Secondary | ICD-10-CM | POA: Diagnosis not present

## 2021-12-18 LAB — COMPREHENSIVE METABOLIC PANEL
Albumin: 4.4 (ref 3.5–5.0)
Calcium: 9.2 (ref 8.7–10.7)

## 2021-12-18 LAB — BASIC METABOLIC PANEL
BUN: 13 (ref 4–21)
CO2: 23 — AB (ref 13–22)
Chloride: 105 (ref 99–108)
Creatinine: 0.7 (ref 0.5–1.1)
Glucose: 123
Potassium: 4.3 (ref 3.4–5.3)
Sodium: 137 (ref 137–147)

## 2021-12-18 LAB — HEPATIC FUNCTION PANEL
ALT: 40 — AB (ref 7–35)
AST: 39 — AB (ref 13–35)
Alkaline Phosphatase: 94 (ref 25–125)
Bilirubin, Total: 0.6

## 2021-12-18 LAB — CBC AND DIFFERENTIAL
HCT: 46 (ref 36–46)
Hemoglobin: 15.1 (ref 12.0–16.0)
Neutrophils Absolute: 4.02
Platelets: 201 (ref 150–399)
WBC: 6.7

## 2021-12-18 LAB — TSH: TSH: 2.26 (ref 0.41–5.90)

## 2021-12-18 LAB — CBC: RBC: 4.61 (ref 3.87–5.11)

## 2021-12-19 ENCOUNTER — Inpatient Hospital Stay: Payer: Medicare PPO | Admitting: Hematology and Oncology

## 2021-12-19 ENCOUNTER — Telehealth: Payer: Self-pay | Admitting: Hematology and Oncology

## 2021-12-19 ENCOUNTER — Encounter: Payer: Self-pay | Admitting: Hematology and Oncology

## 2021-12-19 ENCOUNTER — Encounter: Payer: Self-pay | Admitting: Oncology

## 2021-12-19 DIAGNOSIS — C641 Malignant neoplasm of right kidney, except renal pelvis: Secondary | ICD-10-CM

## 2021-12-19 NOTE — Assessment & Plan Note (Signed)
A 70 y.o. female with metastatic renal cell carcinoma. CT imaging chest/ abdomen/ pelvis reveals stable disease. She will proceed with her 13th cycle of maintenance nivolumab tomorrow, which she is receiving every 4 weeks. Overall, she clinically appears to be doing well. She will return to clinic in 4 weeks for repeat evaluation prior to cycle 14 nivolumab.

## 2021-12-19 NOTE — Telephone Encounter (Signed)
Per 12/28 los next appt scheduled and given to patient

## 2021-12-19 NOTE — Progress Notes (Cosign Needed)
Patient Care Team: Mateo Flow, MD as PCP - General (Family Medicine)  Clinic Day:  12/19/2021  Referring physician: Mateo Flow, MD  ASSESSMENT & PLAN:   Assessment & Plan: Renal cell carcinoma [facility]) A 70 y.o. female with metastatic renal cell carcinoma. CT imaging chest/ abdomen/ pelvis reveals stable disease. She will proceed with her 13th cycle of maintenance nivolumab tomorrow, which she is receiving every 4 weeks. Overall, she clinically appears to be doing well. She will return to clinic in 4 weeks for repeat evaluation prior to cycle 14 nivolumab.     The patient understands the plans discussed today and is in agreement with them.  She knows to contact our office if she develops concerns prior to her next appointment.     [name] Ped, NP  [facility] [address] [place] [address] Dept: [phone] Dept Fax: [phone]   Orders Placed This Encounter  Procedures   Basic metabolic panel    This external order was created through the Results Console.   Comprehensive metabolic panel    This external order was created through the Results Console.   Hepatic function panel    This external order was created through the Results Console.   TSH    This external order was created through the Results Console.   CBC and differential    This external order was created through the Results Console.   CBC    This external order was created through the Results Console.      CHIEF COMPLAINT:  CC: A 70 year old female with history of renal cell carcinoma here for 4 week evaluation  Current Treatment:  Nivolumab  INTERVAL HISTORY:  [name] [name] is here today for repeat clinical assessment. She denies fevers or chills. She denies pain. Her appetite is good. Her weight has been stable.  I have reviewed the past medical history, past surgical history, social history and family history with the patient  and they are unchanged from previous note.  ALLERGIES:  is allergic to codeine, meperidine, gentamicin, naphazoline, neosporin plus max st, other, polymyxin b, pramoxine, sulfa antibiotics, bacitracin-neomycin-polymyxin, bacitracin-polymyxin b, dextrose, fluorometholone, metronidazole, morphine and related, neomycin-polymyxin-gramicidin, prednisone, sodium chloride, tobramycin, and tramadol hcl.  MEDICATIONS:  Current Outpatient Medications  Medication Sig Dispense Refill   amitriptyline (ELAVIL) 25 MG tablet Take 25 mg by mouth daily.     Calcium Carbonate-Vitamin D (CALCIUM-VITAMIN D) 600-125 MG-UNIT TABS Take 1 tablet by mouth 2 (two) times daily. (Patient not taking: Reported on 09/27/2021)     Cholecalciferol (VITAMIN D) 50 MCG (2000 UT) CAPS Take 2,000 Units by mouth daily.     citalopram (CELEXA) 20 MG tablet Take 20 mg by mouth daily.     famotidine (PEPCID) 20 MG tablet Take 20 mg by mouth daily.     gabapentin (NEURONTIN) 100 MG capsule Take 200 mg by mouth at bedtime.     Inulin (FIBER CHOICE PO) Take 1 tablet by mouth 3 times/day as needed-between meals & bedtime.     naproxen sodium (ALEVE) 220 MG tablet Take 220 mg by mouth 2 (two) times daily with a meal.     ondansetron (ZOFRAN) 4 MG tablet Take 1 tablet (4 mg total) by mouth every 4 (four) hours as needed for nausea or vomiting. (Patient not taking: Reported on 09/27/2021) 30 tablet 2   prochlorperazine (COMPAZINE) 10 MG tablet Take 1 tablet (10 mg total) by mouth every 6 (six)  hours as needed for nausea or vomiting. (Patient not taking: Reported on 09/27/2021) 30 tablet 1   triamcinolone (KENALOG) 0.1 % Apply 1 application topically 2 (two) times daily. 453.6 g 1   vitamin B-12 (CYANOCOBALAMIN) 1000 MCG tablet Take 1,000 mcg by mouth daily.     No current facility-administered medications for this visit.    HISTORY OF PRESENT ILLNESS:   Oncology History  Renal cell carcinoma ([facility])  01/03/2021 Cancer Staging   Staging form:  Kidney, AJCC 8th Edition - Clinical stage from 01/03/2021: Stage IV (cT1b, cN0, pM1) - Signed by Marice Potter, MD on 02/09/2021 Histopathologic type: Clear cell adenocarcinoma, NOS Stage prefix: Initial diagnosis    02/09/2021 Initial Diagnosis   Renal cell carcinoma ([facility])   07/31/2021 Imaging   FINDINGS: CT CHEST FINDINGS  Cardiovascular: Right IJ Port-A-Cath terminates in the right atrium. Atherosclerotic calcification of the aorta and coronary arteries. Heart is at the upper limits of normal in size. No pericardial effusion.  Mediastinum/Nodes: Subcentimeter low-attenuation lesion in the left thyroid. No follow-up recommended. (Ref: J Am Coll Radiol. 2015 Feb;12(2): 143-50).No pathologically enlarged mediastinal, hilar or axillary lymph nodes. Esophagus is grossly unremarkable.  Lungs/Pleura: Centrilobular emphysema. Postsurgical scarring in the apical segment right upper lobe. Additional scattered subsegmental volume loss in the right lung. Postoperative changes in the left upper lobe. Pleuroparenchymal scarring at the base of the left hemithorax. 5 mm nodule along the left major fissure (4/45), similar to 12/25/2020. Together with posterior pleural thickening on the left, talc pleurodesis is favored. Large right pleural effusion.  Musculoskeletal: Degenerative changes in the spine. Probable bone island in the T6 vertebral body. Thinning of the anterolateral right fourth rib, posttraumatic or postoperative in etiology most likely. No worrisome lytic or sclerotic lesions.  CT ABDOMEN PELVIS FINDINGS  Hepatobiliary: Exophytic liver parenchyma along the posterior border of the right hepatic lobe. Liver is slightly decreased in attenuation diffusely. Focal blush of hyperattenuation in segment 7 of the liver (2/50), unchanged from 09/21/2020. Liver and gallbladder are otherwise unremarkable. No biliary ductal dilatation.  Pancreas: Negative.  Spleen:  Negative.  Adrenals/Urinary Tract: Adrenal glands are unremarkable. Heterogeneous hyperdense mass in the lower pole right kidney measures 3.4 x 4.1 cm, similar to 03/29/2021, at which time it measured 3.0 x 4.2 cm when remeasured. Left kidney is unremarkable. Ureters are decompressed. Bladder is grossly unremarkable.  Stomach/Bowel: Stomach is decompressed. Small bowel and colon are otherwise unremarkable. Appendix is not visualized.  Vascular/Lymphatic: Atherosclerotic calcification of the aorta. No pathologically enlarged lymph nodes.  Reproductive: No adnexal mass.  Other: No free fluid.  Mesenteries and peritoneum are unremarkable.  Musculoskeletal: There are scattered sclerotic foci in the pelvis, unchanged. Degenerative changes in the spine.  IMPRESSION: 1. Right renal mass is similar to 03/29/2021 and in keeping with renal cell carcinoma. No definitive evidence of metastatic disease. 2. Scattered sclerotic foci in the pelvis, unchanged from 12/25/2020, possibly bone islands. Attention on follow-up is recommended. 3. Large right pleural effusion. 4. Hepatic steatosis. Focal blush of hypermetabolism in the peripheral right hepatic lobe is unchanged from 09/21/2020. 5. Aortic atherosclerosis (ICD10-I70.0). Coronary artery calcification. 6.  Emphysema (ICD10-J43.9).    12/18/2021 Imaging   IMPRESSION:  Stable right renal mass, consistent with renal cell carcinoma.  No definite evidence of metastatic disease within the chest,  abdomen, or pelvis.  Stable large right pleural effusion.  Stable focus of arterial phase hyperenhancement in the right hepatic  lobe, likely benign.  Colonic diverticulosis, without radiographic evidence of  diverticulitis.  Aortic Atherosclerosis (ICD10-I70.0) and Emphysema (ICD10-J43.9).  Electronically Signed        REVIEW OF SYSTEMS:   Constitutional: Denies fevers, chills or abnormal weight loss Eyes: Denies blurriness of  vision Ears, nose, mouth, throat, and face: Denies mucositis or sore throat Respiratory: Denies cough, dyspnea or wheezes Cardiovascular: Denies palpitation, chest discomfort or lower extremity swelling Gastrointestinal:  Denies nausea, heartburn or change in bowel habits Skin: Denies abnormal skin rashes Lymphatics: Denies new lymphadenopathy or easy bruising Neurological:Denies numbness, tingling or new weaknesses Behavioral/Psych: Mood is stable, no new changes  All other systems were reviewed with the patient and are negative.   VITALS:  Blood pressure (!) 150/70, pulse 67, temperature 97.8 F (36.6 C), temperature source Oral, resp. rate 18, height 5\' 1"  (1.549 m), weight 148 lb 12.8 oz (67.5 kg), SpO2 96 %.  Wt Readings from Last 3 Encounters:  12/19/21 148 lb 12.8 oz (67.5 kg)  11/22/21 150 lb 12 oz (68.4 kg)  11/21/21 148 lb 11.2 oz (67.4 kg)    Body mass index is 28.12 kg/m.  Performance status (ECOG): 1 - Symptomatic but completely ambulatory  PHYSICAL EXAM:   GENERAL:alert, no distress and comfortable SKIN: skin color, texture, turgor are normal, no rashes or significant lesions EYES: normal, Conjunctiva are pink and non-injected, sclera clear OROPHARYNX:no exudate, no erythema and lips, buccal mucosa, and tongue normal  NECK: supple, thyroid normal size, non-tender, without nodularity LYMPH:  no palpable lymphadenopathy in the cervical, axillary or inguinal LUNGS: clear to auscultation and percussion with normal breathing effort HEART: regular rate & rhythm and no murmurs and no lower extremity edema ABDOMEN:abdomen soft, non-tender and normal bowel sounds Musculoskeletal:no cyanosis of digits and no clubbing  NEURO: alert & oriented x 3 with fluent speech, no focal motor/sensory deficits  LABORATORY DATA:  I have reviewed the data as listed    Component Value Date/Time   NA 137 12/18/2021 0000   K 4.3 12/18/2021 0000   CL 105 12/18/2021 0000   CO2 23 (A)  12/18/2021 0000   GLUCOSE 98 11/28/2020 1009   BUN 13 12/18/2021 0000   CREATININE 0.7 12/18/2021 0000   CREATININE 0.97 11/28/2020 1009   CALCIUM 9.2 12/18/2021 0000   PROT 5.4 (A) 04/26/2021 0000   ALBUMIN 4.4 12/18/2021 0000   AST 39 (A) 12/18/2021 0000   ALT 40 (A) 12/18/2021 0000   ALKPHOS 94 12/18/2021 0000   BILITOT 0.3 11/28/2020 1009    No results found for: SPEP, UPEP  Lab Results  Component Value Date   WBC 6.7 12/18/2021   NEUTROABS 4.02 12/18/2021   HGB 15.1 12/18/2021   HCT 46 12/18/2021   MCV 96 04/26/2021   PLT 201 12/18/2021      Chemistry      Component Value Date/Time   NA 137 12/18/2021 0000   K 4.3 12/18/2021 0000   CL 105 12/18/2021 0000   CO2 23 (A) 12/18/2021 0000   BUN 13 12/18/2021 0000   CREATININE 0.7 12/18/2021 0000   CREATININE 0.97 11/28/2020 1009   GLU 123 12/18/2021 0000      Component Value Date/Time   CALCIUM 9.2 12/18/2021 0000   ALKPHOS 94 12/18/2021 0000   AST 39 (A) 12/18/2021 0000   ALT 40 (A) 12/18/2021 0000   BILITOT 0.3 11/28/2020 1009       RADIOGRAPHIC STUDIES: I have personally reviewed the radiological images as listed and agreed with the findings in the report.   Exam(s): [id number] CT/CT  CHEST-ABD-PELV W/IV CM  CLINICAL DATA: Follow-up metastatic renal cell carcinoma.  EXAM:  CT CHEST, ABDOMEN, AND PELVIS WITH CONTRAST  TECHNIQUE:  Multidetector CT imaging of the chest, abdomen and pelvis was  performed following the standard protocol during bolus  administration of intravenous contrast.  CONTRAST: 100 mL Isovue 370  COMPARISON: 07/31/2021  FINDINGS:   CT CHEST FINDINGS  Cardiovascular: No acute findings.  Mediastinum/Lymph Nodes: No masses or pathologically enlarged lymph  nodes identified.  Lungs/Pleura: Stable pleural-parenchymal scarring in both lung  apices and bilateral lower lung zones. Stable large right pleural  effusion. Mild-to-moderate centrilobular emphysema again noted. No  suspicious  pulmonary nodules or masses identified.  Musculoskeletal: No suspicious bone lesions identified.   CT ABDOMEN AND PELVIS FINDINGS  Hepatobiliary: Stable hepatic steatosis as well as exophytic  nodularity of the liver dome. A focal area of arterial phase  hyperenhancement is again seen in the posterolateral right hepatic  lobe, which shows no evidence of contrast washout and is unchanged  since prior exams. No new or enlarging liver lesions identified.  Gallbladder is unremarkable. No evidence of biliary ductal  dilatation.  Pancreas: No mass or inflammatory changes.  Spleen: Within normal limits in size and appearance.  Adrenals/Urinary tract: Heterogeneously enhancing mass in the  lateral lower pole the right kidney remains stable measuring 4.1 x  3.4 cm. No other renal masses identified. No evidence of  hydronephrosis. Unremarkable unopacified urinary bladder.  Stomach/Bowel: No evidence of obstruction, inflammatory process, or  abnormal fluid collections. Diverticulosis is seen mainly involving  the sigmoid colon, however there is no evidence of diverticulitis.  Vascular/Lymphatic: No pathologically enlarged lymph nodes  identified. No acute vascular findings. Aortic atherosclerotic  calcification noted.  Reproductive: No mass or other significant abnormality identified.  Other: None.  Musculoskeletal: No suspicious bone lesions identified.   IMPRESSION:  Stable right renal mass, consistent with renal cell carcinoma.  No definite evidence of metastatic disease within the chest,  abdomen, or pelvis.  Stable large right pleural effusion.  Stable focus of arterial phase hyperenhancement in the right hepatic  lobe, likely benign.  Colonic diverticulosis, without radiographic evidence of  diverticulitis.  Aortic Atherosclerosis (ICD10-I70.0) and Emphysema (ICD10-J43.9).  Electronically Signed  By: Marlaine Hind M.D.  On: 12/18/2021 14:31  Electronically Signed By: Marlaine Hind MD   Electronically Signed Date/Time: 12/27/221434  Dictate Date/Time: 12/18/21 1415  Technologist: Ignacia Marvel  Transcribed By: Odie Sera  Transcribed Date/Time: 12/18/21 1431

## 2021-12-20 ENCOUNTER — Inpatient Hospital Stay: Payer: Medicare PPO

## 2021-12-20 ENCOUNTER — Other Ambulatory Visit: Payer: Self-pay

## 2021-12-20 ENCOUNTER — Encounter: Payer: Self-pay | Admitting: Oncology

## 2021-12-20 VITALS — BP 146/76 | HR 71 | Temp 98.1°F | Resp 18 | Ht 61.0 in | Wt 148.0 lb

## 2021-12-20 DIAGNOSIS — Z5112 Encounter for antineoplastic immunotherapy: Secondary | ICD-10-CM | POA: Diagnosis not present

## 2021-12-20 DIAGNOSIS — C641 Malignant neoplasm of right kidney, except renal pelvis: Secondary | ICD-10-CM | POA: Diagnosis not present

## 2021-12-20 DIAGNOSIS — D3001 Benign neoplasm of right kidney: Secondary | ICD-10-CM

## 2021-12-20 DIAGNOSIS — Z79899 Other long term (current) drug therapy: Secondary | ICD-10-CM | POA: Diagnosis not present

## 2021-12-20 MED ORDER — SODIUM CHLORIDE 0.9% FLUSH
10.0000 mL | INTRAVENOUS | Status: DC | PRN
  Administered 2021-12-20: 10 mL

## 2021-12-20 MED ORDER — SODIUM CHLORIDE 0.9 % IV SOLN
480.0000 mg | Freq: Once | INTRAVENOUS | Status: AC
  Administered 2021-12-20: 480 mg via INTRAVENOUS
  Filled 2021-12-20: qty 48

## 2021-12-20 MED ORDER — HEPARIN SOD (PORK) LOCK FLUSH 100 UNIT/ML IV SOLN
500.0000 [IU] | Freq: Once | INTRAVENOUS | Status: AC | PRN
  Administered 2021-12-20: 500 [IU]

## 2021-12-20 MED ORDER — SODIUM CHLORIDE 0.9 % IV SOLN
Freq: Once | INTRAVENOUS | Status: AC
Start: 2021-12-20 — End: 2021-12-20

## 2021-12-20 NOTE — Patient Instructions (Signed)
Nivolumab injection °What is this medication? °NIVOLUMAB (nye VOL ue mab) is a monoclonal antibody. It treats certain types of cancer. Some of the cancers treated are colon cancer, head and neck cancer, Hodgkin lymphoma, lung cancer, and melanoma. °This medicine may be used for other purposes; ask your health care provider or pharmacist if you have questions. °COMMON BRAND NAME(S): Opdivo °What should I tell my care team before I take this medication? °They need to know if you have any of these conditions: °Autoimmune diseases such as Crohn's disease, ulcerative colitis, or lupus °Have had or planning to have an allogeneic stem cell transplant (uses someone else's stem cells) °History of chest radiation °Organ transplant °Nervous system problems such as myasthenia gravis or Guillain-Barre syndrome °An unusual or allergic reaction to nivolumab, other medicines, foods, dyes, or preservatives °Pregnant or trying to get pregnant °Breast-feeding °How should I use this medication? °This medication is injected into a vein. It is given in a hospital or clinic setting. °A special MedGuide will be given to you before each treatment. Be sure to read this information carefully each time. °Talk to your care team regarding the use of this medication in children. While it may be prescribed for children as young as 12 years for selected conditions, precautions do apply. °Overdosage: If you think you have taken too much of this medicine contact a poison control center or emergency room at once. °NOTE: This medicine is only for you. Do not share this medicine with others. °What if I miss a dose? °Keep appointments for follow-up doses. It is important not to miss your dose. Call your care team if you are unable to keep an appointment. °What may interact with this medication? °Interactions have not been studied. °This list may not describe all possible interactions. Give your health care provider a list of all the medicines, herbs,  non-prescription drugs, or dietary supplements you use. Also tell them if you smoke, drink alcohol, or use illegal drugs. Some items may interact with your medicine. °What should I watch for while using this medication? °Your condition will be monitored carefully while you are receiving this medication. °You may need blood work done while you are taking this medication. °Do not become pregnant while taking this medication or for 5 months after stopping it. Women should inform their care team if they wish to become pregnant or think they might be pregnant. There is a potential for serious harm to an unborn child. Talk to your care team for more information. Do not breast-feed an infant while taking this medication or for 5 months after stopping it. °What side effects may I notice from receiving this medication? °Side effects that you should report to your care team as soon as possible: °Allergic reactions--skin rash, itching, hives, swelling of the face, lips, tongue, or throat °Bloody or black, tar-like stools °Change in vision °Chest pain °Diarrhea °Dry cough, shortness of breath or trouble breathing °Eye pain °Fast or irregular heartbeat °Fever, chills °High blood sugar (hyperglycemia)--increased thirst or amount of urine, unusual weakness or fatigue, blurry vision °High thyroid levels (hyperthyroidism)--fast or irregular heartbeat, weight loss, excessive sweating or sensitivity to heat, tremors or shaking, anxiety, nervousness, irregular menstrual cycle or spotting °Kidney injury--decrease in the amount of urine, swelling of the ankles, hands, or feet °Liver injury--right upper belly pain, loss of appetite, nausea, light-colored stool, dark yellow or brown urine, yellowing skin or eyes, unusual weakness or fatigue °Low red blood cell count--unusual weakness or fatigue, dizziness, headache, trouble breathing °  Low thyroid levels (hypothyroidism)--unusual weakness or fatigue, increased sensitivity to cold,  constipation, hair loss, dry skin, weight gain, feelings of depression °Mood and behavior changes-confusion, change in sex drive or performance, irritability °Muscle pain or cramps °Pain, tingling, or numbness in the hands or feet, muscle weakness, trouble walking, loss of balance or coordination °Red or dark brown urine °Redness, blistering, peeling, or loosening of the skin, including inside the mouth °Stomach pain °Unusual bruising or bleeding °Side effects that usually do not require medical attention (report to your care team if they continue or are bothersome): °Bone pain °Constipation °Loss of appetite °Nausea °Tiredness °Vomiting °This list may not describe all possible side effects. Call your doctor for medical advice about side effects. You may report side effects to FDA at 1-800-FDA-1088. °Where should I keep my medication? °This medication is given in a hospital or clinic and will not be stored at home. °NOTE: This sheet is a summary. It may not cover all possible information. If you have questions about this medicine, talk to your doctor, pharmacist, or health care provider. °© 2022 Elsevier/Gold Standard (2021-08-28 00:00:00) ° °

## 2021-12-24 DIAGNOSIS — D485 Neoplasm of uncertain behavior of skin: Secondary | ICD-10-CM | POA: Diagnosis not present

## 2021-12-24 DIAGNOSIS — Z85828 Personal history of other malignant neoplasm of skin: Secondary | ICD-10-CM | POA: Diagnosis not present

## 2021-12-24 DIAGNOSIS — Z08 Encounter for follow-up examination after completed treatment for malignant neoplasm: Secondary | ICD-10-CM | POA: Diagnosis not present

## 2021-12-24 DIAGNOSIS — C44212 Basal cell carcinoma of skin of right ear and external auricular canal: Secondary | ICD-10-CM | POA: Diagnosis not present

## 2021-12-24 DIAGNOSIS — L300 Nummular dermatitis: Secondary | ICD-10-CM | POA: Diagnosis not present

## 2022-01-10 NOTE — Progress Notes (Signed)
[name]  [address] [place],  [name]  [address] [phone]  Clinic Day:  01/16/2022  Referring physician: Mateo Flow, MD  This document serves as a record of services personally performed by [name], MD. It was created on their behalf by [facility] E, a trained medical scribe. The creation of this record is based on the scribe's personal observations and the provider's statements to them.  HISTORY OF PRESENT ILLNESS:  The patient is a 71 y.o. female with metastatic renal cell carcinoma, proven per a right-sided thoracentesis in early December 2021.  She comes in today prior to her 10th cycle of maintenance nivolumab.  The patient states that she tolerated her 9th cycle well without having any significant difficulties.  Of note, this patient's maintenance nivolumab was preceded by 3 cycles of nivolumab/ipilimumab; a 4th cycle was not given due to her being hospitalized for severe colitis related to the ipilimumab component of her combination immunotherapy.  She claims to still have a cough and swallowing issues.  Of note, her recent CT scan showed a large, but stable, right pleural effusion.  Despite her coughing, she is not believe a repeat right thoracentesis to be considered at this time.    PHYSICAL EXAM:  Blood pressure (!) 155/74, pulse 85, temperature 97.8 F (36.6 C), resp. rate 16, height 5\' 1"  (1.549 m), weight 150 lb 14.4 oz (68.4 kg), SpO2 97 %. Wt Readings from Last 3 Encounters:  01/16/22 150 lb 14.4 oz (68.4 kg)  12/20/21 148 lb (67.1 kg)  12/19/21 148 lb 12.8 oz (67.5 kg)   Body mass index is 28.51 kg/m. Performance status (ECOG): 0 Physical Exam Constitutional:      Appearance: Normal appearance. She is not ill-appearing (She continues to look physically better vs previous visits).     Comments:    HENT:     Mouth/Throat:     Mouth: Mucous membranes are moist.     Pharynx: Oropharynx is clear. No oropharyngeal  exudate or posterior oropharyngeal erythema.  Cardiovascular:     Rate and Rhythm: Normal rate and regular rhythm.     Heart sounds: No murmur heard.   No friction rub. No gallop.  Pulmonary:     Effort: Pulmonary effort is normal. No respiratory distress.     Breath sounds: Examination of the right-lower field reveals decreased breath sounds. Decreased breath sounds present. No wheezing, rhonchi or rales.  Abdominal:     General: Bowel sounds are normal. There is no distension.     Palpations: Abdomen is soft. There is no mass.     Tenderness: There is no abdominal tenderness.  Musculoskeletal:        General: No swelling.     Right lower leg: No edema.     Left lower leg: No edema.  Lymphadenopathy:     Cervical: No cervical adenopathy.     Upper Body:     Right upper body: No supraclavicular or axillary adenopathy.     Left upper body: No supraclavicular or axillary adenopathy.     Lower Body: No right inguinal adenopathy. No left inguinal adenopathy.  Skin:    General: Skin is warm.     Coloration: Skin is not jaundiced.     Findings: Rash (facial rash seen) present. No lesion.  Neurological:     General: No focal deficit present.     Mental Status: She is alert and oriented to person, place, and time. Mental status is  at baseline.  Psychiatric:        Mood and Affect: Mood normal.        Behavior: Behavior normal.        Thought Content: Thought content normal.    LABS:    Latest Reference Range & Units 01/16/22 00:00  Sodium 137 - 147  140  Potassium 3.4 - 5.3  4.1  Chloride 99 - 108  105  CO2 13 - 22  26 !  Glucose  159  BUN 4 - 21  15  Creatinine 0.5 - 1.1  0.8  Calcium 8.7 - 10.7  8.9  Alkaline Phosphatase 25 - 125  91  Albumin 3.5 - 5.0  4.3  AST 13 - 35  48 !  ALT 7 - 35  40 !  Bilirubin, Total  0.9  WBC  7.0  RBC 3.87 - 5.11  4.7  Hemoglobin 12.0 - 16.0  15.7  HCT 36 - 46  46  Platelets 150 - 399  204  NEUT#  4.27    ASSESSMENT & PLAN:  A 71  y.o. female with metastatic renal cell carcinoma.  She will proceed with her 10th cycle of maintenance nivolumab tomorrow, which she is receiving every 4 weeks. Overall, she clinically appears to be doing well.  I will see her back in 4 weeks before she heads into her 11th cycle of maintenance nivolumab. The patient understands all the plans discussed today and is in agreement with them.    I, Rita Ohara, am acting as scribe for Marice Potter, MD    I have reviewed this report as typed by the medical scribe, and it is complete and accurate.  Alija Riano Macarthur Critchley, MD

## 2022-01-15 ENCOUNTER — Other Ambulatory Visit: Payer: Self-pay | Admitting: Oncology

## 2022-01-16 ENCOUNTER — Encounter: Payer: Self-pay | Admitting: Oncology

## 2022-01-16 ENCOUNTER — Other Ambulatory Visit: Payer: Self-pay

## 2022-01-16 ENCOUNTER — Inpatient Hospital Stay: Payer: Medicare PPO

## 2022-01-16 ENCOUNTER — Inpatient Hospital Stay: Payer: Medicare PPO | Attending: Oncology | Admitting: Oncology

## 2022-01-16 VITALS — BP 155/74 | HR 85 | Temp 97.8°F | Resp 16 | Ht 61.0 in | Wt 150.9 lb

## 2022-01-16 DIAGNOSIS — C641 Malignant neoplasm of right kidney, except renal pelvis: Secondary | ICD-10-CM | POA: Diagnosis not present

## 2022-01-16 DIAGNOSIS — Z79899 Other long term (current) drug therapy: Secondary | ICD-10-CM | POA: Insufficient documentation

## 2022-01-16 DIAGNOSIS — Z5112 Encounter for antineoplastic immunotherapy: Secondary | ICD-10-CM | POA: Insufficient documentation

## 2022-01-16 LAB — BASIC METABOLIC PANEL
BUN: 15 (ref 4–21)
CO2: 26 — AB (ref 13–22)
Chloride: 105 (ref 99–108)
Creatinine: 0.8 (ref 0.5–1.1)
Glucose: 159
Potassium: 4.1 (ref 3.4–5.3)
Sodium: 140 (ref 137–147)

## 2022-01-16 LAB — CBC AND DIFFERENTIAL
HCT: 46 (ref 36–46)
Hemoglobin: 15.7 (ref 12.0–16.0)
Neutrophils Absolute: 4.27
Platelets: 204 (ref 150–399)
WBC: 7

## 2022-01-16 LAB — HEPATIC FUNCTION PANEL
ALT: 40 — AB (ref 7–35)
AST: 48 — AB (ref 13–35)
Alkaline Phosphatase: 91 (ref 25–125)
Bilirubin, Total: 0.9

## 2022-01-16 LAB — COMPREHENSIVE METABOLIC PANEL
Albumin: 4.3 (ref 3.5–5.0)
Calcium: 8.9 (ref 8.7–10.7)

## 2022-01-16 LAB — TSH: TSH: 1.774 u[IU]/mL (ref 0.350–4.500)

## 2022-01-16 LAB — CBC: RBC: 4.7 (ref 3.87–5.11)

## 2022-01-17 ENCOUNTER — Inpatient Hospital Stay: Payer: Medicare PPO

## 2022-01-17 VITALS — BP 148/85 | HR 71 | Temp 98.0°F | Resp 20 | Wt 151.0 lb

## 2022-01-17 DIAGNOSIS — D3001 Benign neoplasm of right kidney: Secondary | ICD-10-CM

## 2022-01-17 DIAGNOSIS — C641 Malignant neoplasm of right kidney, except renal pelvis: Secondary | ICD-10-CM | POA: Diagnosis not present

## 2022-01-17 DIAGNOSIS — Z5112 Encounter for antineoplastic immunotherapy: Secondary | ICD-10-CM | POA: Diagnosis not present

## 2022-01-17 DIAGNOSIS — Z79899 Other long term (current) drug therapy: Secondary | ICD-10-CM | POA: Diagnosis not present

## 2022-01-17 MED ORDER — SODIUM CHLORIDE 0.9 % IV SOLN: Freq: Once | INTRAVENOUS | Status: AC

## 2022-01-17 MED ORDER — SODIUM CHLORIDE 0.9% FLUSH
10.0000 mL | INTRAVENOUS | Status: DC | PRN
  Administered 2022-01-17: 10 mL

## 2022-01-17 MED ORDER — SODIUM CHLORIDE 0.9 % IV SOLN
480.0000 mg | Freq: Once | INTRAVENOUS | Status: AC
  Administered 2022-01-17: 480 mg via INTRAVENOUS
  Filled 2022-01-17: qty 48

## 2022-01-17 MED ORDER — HEPARIN SOD (PORK) LOCK FLUSH 100 UNIT/ML IV SOLN
500.0000 [IU] | Freq: Once | INTRAVENOUS | Status: AC | PRN
  Administered 2022-01-17: 500 [IU]

## 2022-01-17 NOTE — Patient Instructions (Signed)
[facility]  Discharge Instructions: Thank you for choosing [facility] to provide your oncology and hematology care.  If you have a lab appointment with the [facility], please go directly to the [facility] and check in at the registration area.   Wear comfortable clothing and clothing appropriate for easy access to any Portacath or PICC line.   We strive to give you quality time with your provider. You may need to reschedule your appointment if you arrive late (15 or more minutes).  Arriving late affects you and other patients whose appointments are after yours.  Also, if you miss three or more appointments without notifying the office, you may be dismissed from the clinic at the providers discretion.      For prescription refill requests, have your pharmacy contact our office and allow 72 hours for refills to be completed.    Today you received the following chemotherapy and/or immunotherapy agents Opdivo      To help prevent nausea and vomiting after your treatment, we encourage you to take your nausea medication as directed.  BELOW ARE SYMPTOMS THAT SHOULD BE REPORTED IMMEDIATELY: *FEVER GREATER THAN 100.4 F (38 C) OR HIGHER *CHILLS OR SWEATING *NAUSEA AND VOMITING THAT IS NOT CONTROLLED WITH YOUR NAUSEA MEDICATION *UNUSUAL SHORTNESS OF BREATH *UNUSUAL BRUISING OR BLEEDING *URINARY PROBLEMS (pain or burning when urinating, or frequent urination) *BOWEL PROBLEMS (unusual diarrhea, constipation, pain near the anus) TENDERNESS IN MOUTH AND THROAT WITH OR WITHOUT PRESENCE OF ULCERS (sore throat, sores in mouth, or a toothache) UNUSUAL RASH, SWELLING OR PAIN  UNUSUAL VAGINAL DISCHARGE OR ITCHING   Items with * indicate a potential emergency and should be followed up as soon as possible or go to the Emergency Department if any problems should occur.  Please show the CHEMOTHERAPY ALERT CARD or IMMUNOTHERAPY ALERT CARD at check-in to the  Emergency Department and triage nurse.  Should you have questions after your visit or need to cancel or reschedule your appointment, please contact [facility]  Dept: [phone]  and follow the prompts.  Office hours are 8:00 a.m. to 4:30 p.m. Monday - Friday. Please note that voicemails left after 4:00 p.m. may not be returned until the following business day.  We are closed weekends and major holidays. You have access to a nurse at all times for urgent questions. Please call the main number to the clinic Dept: [phone] and follow the prompts.  For any non-urgent questions, you may also contact your provider using MyChart. We now offer e-Visits for anyone 3 and older to request care online for non-urgent symptoms. For details visit [email or web address].   Also download the MyChart app! Go to the app store, search "MyChart", open the app, select [facility], and log in with your MyChart username and password.  Due to Covid, a mask is required upon entering the hospital/clinic. If you do not have a mask, one will be given to you upon arrival. For doctor visits, patients may have 1 support person aged 28 or older with them. For treatment visits, patients cannot have anyone with them due to current Covid guidelines and our immunocompromised population.   Nivolumab injection What is this medication? NIVOLUMAB (nye VOL ue mab) is a monoclonal antibody. It treats certain types of cancer. Some of the cancers treated are colon cancer, head and neck cancer, Hodgkin lymphoma, lung cancer, and melanoma. This medicine may be used for other purposes; ask your health care  provider or pharmacist if you have questions. COMMON BRAND NAME(S): Opdivo What should I tell my care team before I take this medication? They need to know if you have any of these conditions: Autoimmune diseases such as Crohn's disease, ulcerative colitis, or lupus Have had or planning to have an allogeneic  stem cell transplant (uses someone else's stem cells) History of chest radiation Organ transplant Nervous system problems such as myasthenia gravis or Guillain-Barre syndrome An unusual or allergic reaction to nivolumab, other medicines, foods, dyes, or preservatives Pregnant or trying to get pregnant Breast-feeding How should I use this medication? This medication is injected into a vein. It is given in a hospital or clinic setting. A special MedGuide will be given to you before each treatment. Be sure to read this information carefully each time. Talk to your care team regarding the use of this medication in children. While it may be prescribed for children as young as 12 years for selected conditions, precautions do apply. Overdosage: If you think you have taken too much of this medicine contact a poison control center or emergency room at once. NOTE: This medicine is only for you. Do not share this medicine with others. What if I miss a dose? Keep appointments for follow-up doses. It is important not to miss your dose. Call your care team if you are unable to keep an appointment. What may interact with this medication? Interactions have not been studied. This list may not describe all possible interactions. Give your health care provider a list of all the medicines, herbs, non-prescription drugs, or dietary supplements you use. Also tell them if you smoke, drink alcohol, or use illegal drugs. Some items may interact with your medicine. What should I watch for while using this medication? Your condition will be monitored carefully while you are receiving this medication. You may need blood work done while you are taking this medication. Do not become pregnant while taking this medication or for 5 months after stopping it. Women should inform their care team if they wish to become pregnant or think they might be pregnant. There is a potential for serious harm to an unborn child. Talk to your  care team for more information. Do not breast-feed an infant while taking this medication or for 5 months after stopping it. What side effects may I notice from receiving this medication? Side effects that you should report to your care team as soon as possible: Allergic reactions--skin rash, itching, hives, swelling of the face, lips, tongue, or throat Bloody or black, tar-like stools Change in vision Chest pain Diarrhea Dry cough, shortness of breath or trouble breathing Eye pain Fast or irregular heartbeat Fever, chills High blood sugar (hyperglycemia)--increased thirst or amount of urine, unusual weakness or fatigue, blurry vision High thyroid levels (hyperthyroidism)--fast or irregular heartbeat, weight loss, excessive sweating or sensitivity to heat, tremors or shaking, anxiety, nervousness, irregular menstrual cycle or spotting Kidney injury--decrease in the amount of urine, swelling of the ankles, hands, or feet Liver injury--right upper belly pain, loss of appetite, nausea, light-colored stool, dark yellow or brown urine, yellowing skin or eyes, unusual weakness or fatigue Low red blood cell count--unusual weakness or fatigue, dizziness, headache, trouble breathing Low thyroid levels (hypothyroidism)--unusual weakness or fatigue, increased sensitivity to cold, constipation, hair loss, dry skin, weight gain, feelings of depression Mood and behavior changes-confusion, change in sex drive or performance, irritability Muscle pain or cramps Pain, tingling, or numbness in the hands or feet, muscle weakness, trouble walking, loss  of balance or coordination Red or dark brown urine Redness, blistering, peeling, or loosening of the skin, including inside the mouth Stomach pain Unusual bruising or bleeding Side effects that usually do not require medical attention (report to your care team if they continue or are bothersome): Bone pain Constipation Loss of  appetite Nausea Tiredness Vomiting This list may not describe all possible side effects. Call your doctor for medical advice about side effects. You may report side effects to FDA at 1-800-FDA-1088. Where should I keep my medication? This medication is given in a hospital or clinic and will not be stored at home. NOTE: This sheet is a summary. It may not cover all possible information. If you have questions about this medicine, talk to your doctor, pharmacist, or health care provider.  2022 Elsevier/Gold Standard (2021-08-28 00:00:00)

## 2022-01-18 ENCOUNTER — Encounter: Payer: Self-pay | Admitting: Oncology

## 2022-02-06 NOTE — Progress Notes (Signed)
[facility]  [address] [place],  [place]  [address] [phone]  Clinic Day:  02/13/2022  Referring physician: Mateo Flow, MD  This document serves as a record of services personally performed by Samanthan Dugo Macarthur Critchley, MD. It was created on their behalf by [facility] E, a trained medical scribe. The creation of this record is based on the scribe's personal observations and the provider's statements to them.  HISTORY OF PRESENT ILLNESS:  The patient is a 71 y.o. female with metastatic renal cell carcinoma, proven per a right-sided thoracentesis in early December 2021.  She comes in today prior to her 11th cycle of maintenance nivolumab.  The patient states that she tolerated her 10th cycle well without having any significant difficulties.  Of note, this patient's maintenance nivolumab was preceded by 3 cycles of nivolumab/ipilimumab; a 4th cycle was not given due to her being hospitalized for severe colitis related to the ipilimumab component of her combination immunotherapy.  She claims to still have an occasional cough.  Otherwise, she denies having any problems which concern her for disease progression.     PHYSICAL EXAM:  Blood pressure (!) 184/79, pulse 76, temperature 97.9 F (36.6 C), resp. rate 16, height 5\' 1"  (1.549 m), weight 152 lb 1.6 oz (69 kg), SpO2 92 %. Wt Readings from Last 3 Encounters:  02/13/22 152 lb 1.6 oz (69 kg)  01/17/22 151 lb (68.5 kg)  01/16/22 150 lb 14.4 oz (68.4 kg)   Body mass index is 28.74 kg/m. Performance status (ECOG): 0 Physical Exam Constitutional:      Appearance: Normal appearance. She is not ill-appearing (She continues to look physically better vs previous visits).     Comments:    HENT:     Mouth/Throat:     Mouth: Mucous membranes are moist.     Pharynx: Oropharynx is clear. No oropharyngeal exudate or posterior oropharyngeal erythema.  Cardiovascular:     Rate and Rhythm: Normal rate and regular  rhythm.     Heart sounds: No murmur heard.   No friction rub. No gallop.  Pulmonary:     Effort: Pulmonary effort is normal. No respiratory distress.     Breath sounds: Examination of the right-lower field reveals decreased breath sounds. Decreased breath sounds present. No wheezing, rhonchi or rales.  Abdominal:     General: Bowel sounds are normal. There is no distension.     Palpations: Abdomen is soft. There is no mass.     Tenderness: There is no abdominal tenderness.  Musculoskeletal:        General: No swelling.     Right lower leg: No edema.     Left lower leg: No edema.  Lymphadenopathy:     Cervical: No cervical adenopathy.     Upper Body:     Right upper body: No supraclavicular or axillary adenopathy.     Left upper body: No supraclavicular or axillary adenopathy.     Lower Body: No right inguinal adenopathy. No left inguinal adenopathy.  Skin:    General: Skin is warm.     Coloration: Skin is not jaundiced.     Findings: Rash (facial rash seen) present. No lesion.  Neurological:     General: No focal deficit present.     Mental Status: She is alert and oriented to person, place, and time. Mental status is at baseline.  Psychiatric:        Mood and Affect: Mood normal.  Behavior: Behavior normal.        Thought Content: Thought content normal.    LABS:    Latest Reference Range & Units 02/13/22 00:00  Sodium 137 - 147  142  Potassium 3.4 - 5.3  3.9  Chloride 99 - 108  108  CO2 13 - 22  24 !  Glucose  113  BUN 4 - 21  14  Creatinine 0.5 - 1.1  0.7  Calcium 8.7 - 10.7  8.8  Alkaline Phosphatase 25 - 125  86  Albumin 3.5 - 5.0  4.3  AST 13 - 35  42 !  ALT 7 - 35  47 !  Bilirubin, Total  0.6  WBC  5.8  RBC 3.87 - 5.11  4.68  Hemoglobin 12.0 - 16.0  15.5  HCT 36 - 46  46  Platelets 150 - 399  185  NEUT#  3.48   ASSESSMENT & PLAN:  A 71 y.o. female with metastatic renal cell carcinoma.  She will proceed with her 11th cycle of maintenance nivolumab  tomorrow, which she is receiving every 4 weeks. Overall, she clinically appears to be doing well.  I will see her back in 4 weeks before she heads into her 12th cycle of maintenance nivolumab. The patient understands all the plans discussed today and is in agreement with them.    I, Rita Ohara, am acting as scribe for Marice Potter, MD    I have reviewed this report as typed by the medical scribe, and it is complete and accurate.  Jeremyah Jelley Macarthur Critchley, MD

## 2022-02-12 DIAGNOSIS — H903 Sensorineural hearing loss, bilateral: Secondary | ICD-10-CM | POA: Diagnosis not present

## 2022-02-12 DIAGNOSIS — L299 Pruritus, unspecified: Secondary | ICD-10-CM | POA: Diagnosis not present

## 2022-02-12 DIAGNOSIS — H8109 Meniere's disease, unspecified ear: Secondary | ICD-10-CM | POA: Diagnosis not present

## 2022-02-13 ENCOUNTER — Inpatient Hospital Stay: Payer: Medicare PPO

## 2022-02-13 ENCOUNTER — Inpatient Hospital Stay: Payer: Medicare PPO | Attending: Oncology | Admitting: Oncology

## 2022-02-13 ENCOUNTER — Other Ambulatory Visit: Payer: Self-pay | Admitting: Hematology and Oncology

## 2022-02-13 ENCOUNTER — Encounter: Payer: Self-pay | Admitting: Oncology

## 2022-02-13 ENCOUNTER — Other Ambulatory Visit: Payer: Self-pay

## 2022-02-13 VITALS — BP 184/79 | HR 76 | Temp 97.9°F | Resp 16 | Ht 61.0 in | Wt 152.1 lb

## 2022-02-13 DIAGNOSIS — C641 Malignant neoplasm of right kidney, except renal pelvis: Secondary | ICD-10-CM

## 2022-02-13 DIAGNOSIS — Z5112 Encounter for antineoplastic immunotherapy: Secondary | ICD-10-CM | POA: Diagnosis not present

## 2022-02-13 DIAGNOSIS — D3001 Benign neoplasm of right kidney: Secondary | ICD-10-CM

## 2022-02-13 DIAGNOSIS — C799 Secondary malignant neoplasm of unspecified site: Secondary | ICD-10-CM | POA: Insufficient documentation

## 2022-02-13 DIAGNOSIS — Z79899 Other long term (current) drug therapy: Secondary | ICD-10-CM | POA: Diagnosis not present

## 2022-02-13 LAB — BASIC METABOLIC PANEL
BUN: 14 (ref 4–21)
CO2: 24 — AB (ref 13–22)
Chloride: 108 (ref 99–108)
Creatinine: 0.7 (ref 0.5–1.1)
Glucose: 113
Potassium: 3.9 (ref 3.4–5.3)
Sodium: 142 (ref 137–147)

## 2022-02-13 LAB — HEPATIC FUNCTION PANEL
ALT: 47 — AB (ref 7–35)
AST: 42 — AB (ref 13–35)
Alkaline Phosphatase: 86 (ref 25–125)
Bilirubin, Total: 0.6

## 2022-02-13 LAB — CBC: RBC: 4.68 (ref 3.87–5.11)

## 2022-02-13 LAB — COMPREHENSIVE METABOLIC PANEL
Albumin: 4.3 (ref 3.5–5.0)
Calcium: 8.8 (ref 8.7–10.7)

## 2022-02-13 LAB — CBC AND DIFFERENTIAL
HCT: 46 (ref 36–46)
Hemoglobin: 15.5 (ref 12.0–16.0)
Neutrophils Absolute: 3.48
Platelets: 185 (ref 150–399)
WBC: 5.8

## 2022-02-13 LAB — TSH: TSH: 1.283 u[IU]/mL (ref 0.350–4.500)

## 2022-02-13 MED FILL — Nivolumab IV Soln 100 MG/10ML: INTRAVENOUS | Qty: 48 | Status: AC

## 2022-02-14 ENCOUNTER — Inpatient Hospital Stay: Payer: Medicare PPO

## 2022-02-14 ENCOUNTER — Ambulatory Visit: Payer: Medicare PPO

## 2022-02-14 VITALS — BP 168/64 | HR 74 | Temp 98.4°F | Resp 16 | Ht 62.68 in | Wt 151.8 lb

## 2022-02-14 DIAGNOSIS — Z5112 Encounter for antineoplastic immunotherapy: Secondary | ICD-10-CM | POA: Diagnosis not present

## 2022-02-14 DIAGNOSIS — C641 Malignant neoplasm of right kidney, except renal pelvis: Secondary | ICD-10-CM | POA: Diagnosis not present

## 2022-02-14 DIAGNOSIS — D3001 Benign neoplasm of right kidney: Secondary | ICD-10-CM

## 2022-02-14 DIAGNOSIS — Z79899 Other long term (current) drug therapy: Secondary | ICD-10-CM | POA: Diagnosis not present

## 2022-02-14 DIAGNOSIS — C799 Secondary malignant neoplasm of unspecified site: Secondary | ICD-10-CM | POA: Diagnosis not present

## 2022-02-14 MED ORDER — HEPARIN SOD (PORK) LOCK FLUSH 100 UNIT/ML IV SOLN
500.0000 [IU] | Freq: Once | INTRAVENOUS | Status: AC | PRN
  Administered 2022-02-14: 500 [IU]

## 2022-02-14 MED ORDER — SODIUM CHLORIDE 0.9 % IV SOLN
480.0000 mg | Freq: Once | INTRAVENOUS | Status: AC
  Administered 2022-02-14: 480 mg via INTRAVENOUS
  Filled 2022-02-14: qty 48

## 2022-02-14 MED ORDER — SODIUM CHLORIDE 0.9 % IV SOLN: Freq: Once | INTRAVENOUS | Status: DC

## 2022-02-14 MED ORDER — SODIUM CHLORIDE 0.9% FLUSH
10.0000 mL | INTRAVENOUS | Status: DC | PRN
  Administered 2022-02-14: 10 mL

## 2022-02-14 NOTE — Progress Notes (Signed)
1126:PT STABLE AT TIME OF DISCHARGE

## 2022-02-14 NOTE — Patient Instructions (Signed)
[facility]  Discharge Instructions: Thank you for choosing [facility] to provide your oncology and hematology care.  If you have a lab appointment with the [facility], please go directly to the [facility] and check in at the registration area.   Wear comfortable clothing and clothing appropriate for easy access to any Portacath or PICC line.   We strive to give you quality time with your provider. You may need to reschedule your appointment if you arrive late (15 or more minutes).  Arriving late affects you and other patients whose appointments are after yours.  Also, if you miss three or more appointments without notifying the office, you may be dismissed from the clinic at the providers discretion.      For prescription refill requests, have your pharmacy contact our office and allow 72 hours for refills to be completed.    Today you received the following chemotherapy and/or immunotherapy agents Nivolumab    To help prevent nausea and vomiting after your treatment, we encourage you to take your nausea medication as directed.  BELOW ARE SYMPTOMS THAT SHOULD BE REPORTED IMMEDIATELY: *FEVER GREATER THAN 100.4 F (38 C) OR HIGHER *CHILLS OR SWEATING *NAUSEA AND VOMITING THAT IS NOT CONTROLLED WITH YOUR NAUSEA MEDICATION *UNUSUAL SHORTNESS OF BREATH *UNUSUAL BRUISING OR BLEEDING *URINARY PROBLEMS (pain or burning when urinating, or frequent urination) *BOWEL PROBLEMS (unusual diarrhea, constipation, pain near the anus) TENDERNESS IN MOUTH AND THROAT WITH OR WITHOUT PRESENCE OF ULCERS (sore throat, sores in mouth, or a toothache) UNUSUAL RASH, SWELLING OR PAIN  UNUSUAL VAGINAL DISCHARGE OR ITCHING   Items with * indicate a potential emergency and should be followed up as soon as possible or go to the Emergency Department if any problems should occur.  Please show the CHEMOTHERAPY ALERT CARD or IMMUNOTHERAPY ALERT CARD at check-in to the  Emergency Department and triage nurse.  Should you have questions after your visit or need to cancel or reschedule your appointment, please contact [facility]  Dept: [phone]  and follow the prompts.  Office hours are 8:00 a.m. to 4:30 p.m. Monday - Friday. Please note that voicemails left after 4:00 p.m. may not be returned until the following business day.  We are closed weekends and major holidays. You have access to a nurse at all times for urgent questions. Please call the main number to the clinic Dept: [phone] and follow the prompts.  For any non-urgent questions, you may also contact your provider using MyChart. We now offer e-Visits for anyone 50 and older to request care online for non-urgent symptoms. For details visit [email or web address].   Also download the MyChart app! Go to the app store, search "MyChart", open the app, select [facility], and log in with your MyChart username and password.  Due to Covid, a mask is required upon entering the hospital/clinic. If you do not have a mask, one will be given to you upon arrival. For doctor visits, patients may have 1 support person aged 73 or older with them. For treatment visits, patients cannot have anyone with them due to current Covid guidelines and our immunocompromised population.

## 2022-02-15 ENCOUNTER — Encounter: Payer: Self-pay | Admitting: Oncology

## 2022-02-20 DIAGNOSIS — H8109 Meniere's disease, unspecified ear: Secondary | ICD-10-CM | POA: Diagnosis not present

## 2022-02-20 DIAGNOSIS — J3089 Other allergic rhinitis: Secondary | ICD-10-CM | POA: Diagnosis not present

## 2022-03-11 DIAGNOSIS — E559 Vitamin D deficiency, unspecified: Secondary | ICD-10-CM | POA: Diagnosis not present

## 2022-03-11 DIAGNOSIS — E538 Deficiency of other specified B group vitamins: Secondary | ICD-10-CM | POA: Diagnosis not present

## 2022-03-11 DIAGNOSIS — Z79899 Other long term (current) drug therapy: Secondary | ICD-10-CM | POA: Diagnosis not present

## 2022-03-11 DIAGNOSIS — E7800 Pure hypercholesterolemia, unspecified: Secondary | ICD-10-CM | POA: Diagnosis not present

## 2022-03-11 DIAGNOSIS — R7309 Other abnormal glucose: Secondary | ICD-10-CM | POA: Diagnosis not present

## 2022-03-12 NOTE — Progress Notes (Signed)
?[facility]  ?[address] ?[place],  [place]  [address] ?[phone] ? ?Clinic Day:  03/13/2022 ? ?Referring physician: Mateo Flow, MD ? ?HISTORY OF PRESENT ILLNESS:  ?The patient is a 71 y.o. female with metastatic renal cell carcinoma, proven per a right-sided thoracentesis in early December 2021.  She comes in today prior to her 12th cycle of maintenance nivolumab.  The patient states that she tolerated her 11th cycle well without having any significant difficulties.  Of note, this patient's maintenance nivolumab was preceded by 3 cycles of nivolumab/ipilimumab; a 4th cycle was not given due to her being hospitalized for severe colitis related to the ipilimumab component of her combination immunotherapy.  She claims to still have an occasional cough.  Otherwise, she denies having any new problems which concern her for disease progression.    ? ?PHYSICAL EXAM:  ?Blood pressure (!) 192/84, pulse 82, temperature 97.9 ?F (36.6 ?C), resp. rate 14, height 5\' 1"  (1.549 m), weight 152 lb 12.8 oz (69.3 kg), SpO2 94 %. ?Wt Readings from Last 3 Encounters:  ?03/13/22 152 lb 12.8 oz (69.3 kg)  ?02/14/22 151 lb 12 oz (68.8 kg)  ?02/13/22 152 lb 1.6 oz (69 kg)  ? ?Body mass index is 28.87 kg/m?[facility] ?Performance status (ECOG): 0 ?Physical Exam ?Constitutional:   ?   Appearance: Normal appearance. She is not ill-appearing (She continues to look physically better vs previous visits).  ?   Comments:  ?  ?HENT:  ?   Mouth/Throat:  ?   Mouth: Mucous membranes are moist.  ?   Pharynx: Oropharynx is clear. No oropharyngeal exudate or posterior oropharyngeal erythema.  ?Cardiovascular:  ?   Rate and Rhythm: Normal rate and regular rhythm.  ?   Heart sounds: No murmur heard. ?  No friction rub. No gallop.  ?Pulmonary:  ?   Effort: Pulmonary effort is normal. No respiratory distress.  ?   Breath sounds: Examination of the right-lower field reveals decreased breath sounds. Decreased breath sounds  present. No wheezing, rhonchi or rales.  ?Abdominal:  ?   General: Bowel sounds are normal. There is no distension.  ?   Palpations: Abdomen is soft. There is no mass.  ?   Tenderness: There is no abdominal tenderness.  ?Musculoskeletal:     ?   General: No swelling.  ?   Right lower leg: No edema.  ?   Left lower leg: No edema.  ?Lymphadenopathy:  ?   Cervical: No cervical adenopathy.  ?   Upper Body:  ?   Right upper body: No supraclavicular or axillary adenopathy.  ?   Left upper body: No supraclavicular or axillary adenopathy.  ?   Lower Body: No right inguinal adenopathy. No left inguinal adenopathy.  ?Skin: ?   General: Skin is warm.  ?   Coloration: Skin is not jaundiced.  ?   Findings: Rash (facial rash seen) present. No lesion.  ?Neurological:  ?   General: No focal deficit present.  ?   Mental Status: She is alert and oriented to person, place, and time. Mental status is at baseline.  ?Psychiatric:     ?   Mood and Affect: Mood normal.     ?   Behavior: Behavior normal.     ?   Thought Content: Thought content normal.  ? ? ?LABS:  ? ? Latest Reference Range & Units 03/13/22 00:00  ?Sodium 137 - 147  139 (E)  ?Potassium 3.5 - 5.1  mEq/L 4.2 (E)  ?Chloride 99 - 108  106 (E)  ?CO2 13 - 22  28 ! (E)  ?Glucose  107 (E)  ?BUN 4 - 21  13 (E)  ?Creatinine 0.5 - 1.1  0.7 (E)  ?Calcium 8.7 - 10.7  9.0 (E)  ?Albumin 3.5 - 5.0  4.2 (E)  ?AST 13 - 35  47 ! (E)  ?ALT 7 - 35 U/L 63 ! (E)  ?Bilirubin, Total  0.6 (E)  ?WBC  5.8 (E)  ?RBC 3.87 - 5.11  4.55 (E)  ?Hemoglobin 12.0 - 16.0  14.9 (E)  ?HCT 36 - 46  45 (E)  ?Platelets 150 - 400 K/uL 172 (E)  ?!: Data is abnormal ?(E): External lab result ? ?ASSESSMENT & PLAN:  ?A 71 y.o. female with metastatic renal cell carcinoma.  She will proceed with her 12th cycle of maintenance nivolumab tomorrow, which she is receiving every 4 weeks. Overall, she clinically appears to be doing well.  I will see her back in 4 weeks before she heads into her 13th cycle of maintenance  nivolumab.  CT scans will be done a day before her next visit to ascertain her new disease baseline after 12 cycles of maintenance nivolumab immunotherapy.  The patient understands all the plans discussed today and is in agreement with them.  ? ? ?Clever Geraldo Macarthur Critchley, MD ?   ? ? ?  ?

## 2022-03-13 ENCOUNTER — Other Ambulatory Visit: Payer: Self-pay

## 2022-03-13 ENCOUNTER — Inpatient Hospital Stay: Payer: Medicare PPO

## 2022-03-13 ENCOUNTER — Other Ambulatory Visit: Payer: Self-pay | Admitting: Oncology

## 2022-03-13 ENCOUNTER — Inpatient Hospital Stay: Payer: Medicare PPO | Attending: Oncology | Admitting: Oncology

## 2022-03-13 VITALS — BP 192/84 | HR 82 | Temp 97.9°F | Resp 14 | Ht 61.0 in | Wt 152.8 lb

## 2022-03-13 DIAGNOSIS — D3001 Benign neoplasm of right kidney: Secondary | ICD-10-CM

## 2022-03-13 DIAGNOSIS — Z79899 Other long term (current) drug therapy: Secondary | ICD-10-CM | POA: Diagnosis not present

## 2022-03-13 DIAGNOSIS — C641 Malignant neoplasm of right kidney, except renal pelvis: Secondary | ICD-10-CM

## 2022-03-13 DIAGNOSIS — Z5112 Encounter for antineoplastic immunotherapy: Secondary | ICD-10-CM | POA: Insufficient documentation

## 2022-03-13 LAB — CBC AND DIFFERENTIAL
HCT: 45 (ref 36–46)
Hemoglobin: 14.9 (ref 12.0–16.0)
Neutrophils Absolute: 3.25
Platelets: 172 10*3/uL (ref 150–400)
WBC: 5.8

## 2022-03-13 LAB — HEPATIC FUNCTION PANEL
ALT: 63 U/L — AB (ref 7–35)
AST: 47 — AB (ref 13–35)
Bilirubin, Total: 0.6

## 2022-03-13 LAB — BASIC METABOLIC PANEL
BUN: 13 (ref 4–21)
CO2: 28 — AB (ref 13–22)
Chloride: 106 (ref 99–108)
Creatinine: 0.7 (ref 0.5–1.1)
Glucose: 107
Potassium: 4.2 mEq/L (ref 3.5–5.1)
Sodium: 139 (ref 137–147)

## 2022-03-13 LAB — TSH: TSH: 1.937 u[IU]/mL (ref 0.350–4.500)

## 2022-03-13 LAB — CBC: RBC: 4.55 (ref 3.87–5.11)

## 2022-03-13 LAB — COMPREHENSIVE METABOLIC PANEL
Albumin: 4.2 (ref 3.5–5.0)
Calcium: 9 (ref 8.7–10.7)

## 2022-03-14 ENCOUNTER — Encounter: Payer: Self-pay | Admitting: Hematology and Oncology

## 2022-03-14 ENCOUNTER — Inpatient Hospital Stay: Payer: Medicare PPO

## 2022-03-14 ENCOUNTER — Encounter: Payer: Self-pay | Admitting: Oncology

## 2022-03-14 VITALS — BP 155/77 | HR 66 | Temp 98.0°F | Resp 17 | Ht 61.0 in | Wt 153.1 lb

## 2022-03-14 DIAGNOSIS — D3001 Benign neoplasm of right kidney: Secondary | ICD-10-CM

## 2022-03-14 DIAGNOSIS — C641 Malignant neoplasm of right kidney, except renal pelvis: Secondary | ICD-10-CM | POA: Diagnosis not present

## 2022-03-14 DIAGNOSIS — Z5112 Encounter for antineoplastic immunotherapy: Secondary | ICD-10-CM | POA: Diagnosis not present

## 2022-03-14 DIAGNOSIS — Z79899 Other long term (current) drug therapy: Secondary | ICD-10-CM | POA: Diagnosis not present

## 2022-03-14 MED ORDER — SODIUM CHLORIDE 0.9% FLUSH
10.0000 mL | INTRAVENOUS | Status: DC | PRN
  Administered 2022-03-14: 10 mL

## 2022-03-14 MED ORDER — SODIUM CHLORIDE 0.9 % IV SOLN: Freq: Once | INTRAVENOUS | Status: AC

## 2022-03-14 MED ORDER — SODIUM CHLORIDE 0.9 % IV SOLN
480.0000 mg | Freq: Once | INTRAVENOUS | Status: AC
  Administered 2022-03-14: 480 mg via INTRAVENOUS
  Filled 2022-03-14: qty 48

## 2022-03-14 MED ORDER — HEPARIN SOD (PORK) LOCK FLUSH 100 UNIT/ML IV SOLN
500.0000 [IU] | Freq: Once | INTRAVENOUS | Status: AC | PRN
  Administered 2022-03-14: 500 [IU]

## 2022-03-14 NOTE — Patient Instructions (Signed)
[facility]  Discharge Instructions: ?Thank you for choosing [facility] to provide your oncology and hematology care.  ?If you have a lab appointment with the [facility], please go directly to the [facility] and check in at the registration area. ?  ?Wear comfortable clothing and clothing appropriate for easy access to any Portacath or PICC line.  ? ?We strive to give you quality time with your provider. You may need to reschedule your appointment if you arrive late (15 or more minutes).  Arriving late affects you and other patients whose appointments are after yours.  Also, if you miss three or more appointments without notifying the office, you may be dismissed from the clinic at the provider?s discretion.    ?  ?For prescription refill requests, have your pharmacy contact our office and allow 72 hours for refills to be completed.   ? ?Today you received the following chemotherapy and/or immunotherapy agents NIVOLUMAB    ?  ?To help prevent nausea and vomiting after your treatment, we encourage you to take your nausea medication as directed. ? ?BELOW ARE SYMPTOMS THAT SHOULD BE REPORTED IMMEDIATELY: ?*FEVER GREATER THAN 100.4 F (38 ?C) OR HIGHER ?*CHILLS OR SWEATING ?*NAUSEA AND VOMITING THAT IS NOT CONTROLLED WITH YOUR NAUSEA MEDICATION ?*UNUSUAL SHORTNESS OF BREATH ?*UNUSUAL BRUISING OR BLEEDING ?*URINARY PROBLEMS (pain or burning when urinating, or frequent urination) ?*BOWEL PROBLEMS (unusual diarrhea, constipation, pain near the anus) ?TENDERNESS IN MOUTH AND THROAT WITH OR WITHOUT PRESENCE OF ULCERS (sore throat, sores in mouth, or a toothache) ?UNUSUAL RASH, SWELLING OR PAIN  ?UNUSUAL VAGINAL DISCHARGE OR ITCHING  ? ?Items with * indicate a potential emergency and should be followed up as soon as possible or go to the Emergency Department if any problems should occur. ? ?Please show the CHEMOTHERAPY ALERT CARD or IMMUNOTHERAPY ALERT CARD at check-in to the  Emergency Department and triage nurse. ? ?Should you have questions after your visit or need to cancel or reschedule your appointment, please contact [facility]  Dept: [phone]  and follow the prompts.  Office hours are 8:00 a.m. to 4:30 p.m. Monday - Friday. Please note that voicemails left after 4:00 p.m. may not be returned until the following business day.  We are closed weekends and major holidays. You have access to a nurse at all times for urgent questions. Please call the main number to the clinic Dept: [phone] and follow the prompts. ? ?For any non-urgent questions, you may also contact your provider using MyChart. We now offer e-Visits for anyone 32 and older to request care online for non-urgent symptoms. For details visit [email or web address]. ?  ?Also download the MyChart app! Go to the app store, search "MyChart", open the app, select [facility], and log in with your MyChart username and password. ? ?Due to Covid, a mask is required upon entering the hospital/clinic. If you do not have a mask, one will be given to you upon arrival. For doctor visits, patients may have 1 support person aged 68 or older with them. For treatment visits, patients cannot have anyone with them due to current Covid guidelines and our immunocompromised population.  ? ? ?

## 2022-03-18 DIAGNOSIS — C641 Malignant neoplasm of right kidney, except renal pelvis: Secondary | ICD-10-CM | POA: Diagnosis not present

## 2022-03-18 DIAGNOSIS — Z6827 Body mass index (BMI) 27.0-27.9, adult: Secondary | ICD-10-CM | POA: Diagnosis not present

## 2022-03-18 DIAGNOSIS — M4800 Spinal stenosis, site unspecified: Secondary | ICD-10-CM | POA: Diagnosis not present

## 2022-03-18 DIAGNOSIS — E538 Deficiency of other specified B group vitamins: Secondary | ICD-10-CM | POA: Diagnosis not present

## 2022-03-18 DIAGNOSIS — Z0000 Encounter for general adult medical examination without abnormal findings: Secondary | ICD-10-CM | POA: Diagnosis not present

## 2022-03-18 DIAGNOSIS — I10 Essential (primary) hypertension: Secondary | ICD-10-CM | POA: Diagnosis not present

## 2022-03-18 DIAGNOSIS — B353 Tinea pedis: Secondary | ICD-10-CM | POA: Diagnosis not present

## 2022-03-18 DIAGNOSIS — E7800 Pure hypercholesterolemia, unspecified: Secondary | ICD-10-CM | POA: Diagnosis not present

## 2022-03-28 ENCOUNTER — Encounter: Payer: Self-pay | Admitting: Oncology

## 2022-04-09 DIAGNOSIS — I700 Atherosclerosis of aorta: Secondary | ICD-10-CM | POA: Diagnosis not present

## 2022-04-09 DIAGNOSIS — C649 Malignant neoplasm of unspecified kidney, except renal pelvis: Secondary | ICD-10-CM | POA: Diagnosis not present

## 2022-04-09 DIAGNOSIS — J90 Pleural effusion, not elsewhere classified: Secondary | ICD-10-CM | POA: Diagnosis not present

## 2022-04-09 DIAGNOSIS — C641 Malignant neoplasm of right kidney, except renal pelvis: Secondary | ICD-10-CM | POA: Diagnosis not present

## 2022-04-09 NOTE — Progress Notes (Signed)
?[facility]  ?[address] ?[place],  [place]  [address] ?[phone] ? ?Clinic Day:  04/10/2022 ? ?Referring physician: Mateo Flow, MD ? ?HISTORY OF PRESENT ILLNESS:  ?The patient is a 71 y.o. female with metastatic renal cell carcinoma, proven per a right-sided thoracentesis in early December 2021.  She comes in today prior to go over her CT scans to ascertain her new disease baseline after receiving 12 cycles of maintenance nivolumab.  The patient states that she tolerated her 12th cycle well without having any significant difficulties.  Of note, this patient's maintenance nivolumab was preceded by 3 cycles of nivolumab/ipilimumab; a 4th cycle was not given due to her being hospitalized for severe colitis related to the ipilimumab component of her combination immunotherapy.  She claims to still have an occasional cough and shortness of breath.  Otherwise, she denies having any new problems which concern her for disease progression.    ? ?PHYSICAL EXAM:  ?There were no vitals taken for this visit. ?Wt Readings from Last 3 Encounters:  ?03/14/22 153 lb 1.9 oz (69.5 kg)  ?03/13/22 152 lb 12.8 oz (69.3 kg)  ?02/14/22 151 lb 12 oz (68.8 kg)  ? ?There is no height or weight on file to calculate BMI. ?Performance status (ECOG): 0 ?Physical Exam ?Constitutional:   ?   Appearance: Normal appearance. She is not ill-appearing (She continues to look physically better vs previous visits).  ?   Comments:  ?  ?HENT:  ?   Mouth/Throat:  ?   Mouth: Mucous membranes are moist.  ?   Pharynx: Oropharynx is clear. No oropharyngeal exudate or posterior oropharyngeal erythema.  ?Cardiovascular:  ?   Rate and Rhythm: Normal rate and regular rhythm.  ?   Heart sounds: No murmur heard. ?  No friction rub. No gallop.  ?Pulmonary:  ?   Effort: Pulmonary effort is normal. No respiratory distress.  ?   Breath sounds: Examination of the right-lower field reveals decreased breath sounds. Decreased breath  sounds present. No wheezing, rhonchi or rales.  ?Abdominal:  ?   General: Bowel sounds are normal. There is no distension.  ?   Palpations: Abdomen is soft. There is no mass.  ?   Tenderness: There is no abdominal tenderness.  ?Musculoskeletal:     ?   General: No swelling.  ?   Right lower leg: No edema.  ?   Left lower leg: No edema.  ?Lymphadenopathy:  ?   Cervical: No cervical adenopathy.  ?   Upper Body:  ?   Right upper body: No supraclavicular or axillary adenopathy.  ?   Left upper body: No supraclavicular or axillary adenopathy.  ?   Lower Body: No right inguinal adenopathy. No left inguinal adenopathy.  ?Skin: ?   General: Skin is warm.  ?   Coloration: Skin is not jaundiced.  ?   Findings: Rash (facial rash seen) present. No lesion.  ?Neurological:  ?   General: No focal deficit present.  ?   Mental Status: She is alert and oriented to person, place, and time. Mental status is at baseline.  ?Psychiatric:     ?   Mood and Affect: Mood normal.     ?   Behavior: Behavior normal.     ?   Thought Content: Thought content normal.  ? ?SCANS: CT scans of her chest/abdomen/pelvis revealed the following: ? ? FINDINGS:  ?CT CHEST FINDINGS  ?Cardiovascular: Normal heart size. No pericardial effusion.  ?  Atherosclerotic disease of the thoracic aorta. No suspicious filling  ?defects of the central pulmonary arteries. Chest wall port with tip  ?near the superior cavoatrial junction.  ?Mediastinum/Nodes: Esophagus and thyroid are unremarkable. No  ?pathologically enlarged lymph nodes seen in the chest.  ?Lungs/Pleura: Central airways are patent. Centrilobular emphysema.  ?No consolidation or pneumothorax. Large right pleural effusion,  ?unchanged in size when compared with prior exam. No suspicious  ?pulmonary nodules.  ?Musculoskeletal: No chest wall mass or suspicious bone lesions  ?identified.  ? ?CT ABDOMEN PELVIS FINDINGS  ?Hepatobiliary: Exophytic nodularity of the liver dome, unchanged  ?when compared with prior  exam. Stable focal area of hyperenhancement  ?in the posterolateral right hepatic dome measuring up to 1.7 cm on  ?series 2, image 50. Focal area hypoattenuation involving the central  ?liver compatible with focal fatty infiltration when compared with  ?prior MR. Gallbladder is unremarkable. No biliary ductal dilation.  ?Pancreas: Unremarkable. No pancreatic ductal dilatation or  ?surrounding inflammatory changes.  ?Spleen: Normal in size without focal abnormality.  ?Adrenals/Urinary Tract: Bilateral adrenal glands are unremarkable.  ?No hydronephrosis. Heterogeneous enhancing mass of the lower pole of  ?the right kidney measuring 3.8 x 3.3 cm on series 2 image 7,  ?previously measured 3.9 x 3.3 cm on prior exam when remeasured in  ?similar plane. Bladder is unremarkable.  ?Stomach/Bowel: Stomach is within normal limits. Appendix is not  ?visualized. No evidence of bowel wall thickening, distention, or  ?inflammatory changes.  ?Vascular/Lymphatic: Aortic atherosclerosis. No enlarged abdominal or  ?pelvic lymph nodes.  ?Reproductive: Uterus and bilateral adnexa are unremarkable.  ?Other: No abdominal wall hernia or abnormality. No abdominopelvic  ?ascites.  ?Musculoskeletal: No acute or significant osseous findings.  ? ?IMPRESSION:  ?1. Stable lower pole right renal mass, compatible with RCC.  ?2. No evidence of metastatic disease in the chest, abdomen or  ?pelvis.  ?3. Stable large right pleural effusion.  ?4. Stable focus of arterial phase enhancement in the right hepatic  ?lobe.  ?5. Aortic Atherosclerosis (ICD10-I70.0) and Emphysema (ICD10-J43.9).  ? ?LABS:  ? ? ?Rate ?ASSESSMENT & PLAN:  ?A 71 y.o. female with metastatic renal cell carcinoma.  In clinic today, I went over all of her CT scan images with her, for which she could see that her disease remains stable.  Her right pleural effusion has not increased in size, nor has a contralateral effusion develop.  Her right renal lesion is also stable in size.  She  will proceed with her 13th cycle of maintenance nivolumab tomorrow, which she is receiving every 4 weeks. Overall, she clinically appears to be doing well.  I will see her back in 4 weeks before she heads into her 14th cycle of maintenance nivolumab.  The patient understands all the plans discussed today and is in agreement with them.  ? ? ?Beverley Allender Macarthur Critchley, MD ?   ? ? ?  ?

## 2022-04-10 ENCOUNTER — Inpatient Hospital Stay: Payer: Medicare PPO | Attending: Oncology | Admitting: Oncology

## 2022-04-10 ENCOUNTER — Encounter: Payer: Self-pay | Admitting: Oncology

## 2022-04-10 ENCOUNTER — Other Ambulatory Visit: Payer: Self-pay | Admitting: Oncology

## 2022-04-10 VITALS — BP 190/86 | HR 69 | Temp 98.5°F | Resp 16 | Ht 61.0 in | Wt 151.7 lb

## 2022-04-10 DIAGNOSIS — C641 Malignant neoplasm of right kidney, except renal pelvis: Secondary | ICD-10-CM | POA: Insufficient documentation

## 2022-04-10 DIAGNOSIS — Z79899 Other long term (current) drug therapy: Secondary | ICD-10-CM | POA: Insufficient documentation

## 2022-04-10 DIAGNOSIS — C799 Secondary malignant neoplasm of unspecified site: Secondary | ICD-10-CM | POA: Insufficient documentation

## 2022-04-10 DIAGNOSIS — Z5112 Encounter for antineoplastic immunotherapy: Secondary | ICD-10-CM | POA: Insufficient documentation

## 2022-04-11 ENCOUNTER — Inpatient Hospital Stay: Payer: Medicare PPO

## 2022-04-11 VITALS — Ht 63.5 in | Wt 151.2 lb

## 2022-04-11 DIAGNOSIS — Z79899 Other long term (current) drug therapy: Secondary | ICD-10-CM | POA: Diagnosis not present

## 2022-04-11 DIAGNOSIS — C799 Secondary malignant neoplasm of unspecified site: Secondary | ICD-10-CM | POA: Diagnosis not present

## 2022-04-11 DIAGNOSIS — D3001 Benign neoplasm of right kidney: Secondary | ICD-10-CM

## 2022-04-11 DIAGNOSIS — Z5112 Encounter for antineoplastic immunotherapy: Secondary | ICD-10-CM | POA: Diagnosis not present

## 2022-04-11 DIAGNOSIS — C641 Malignant neoplasm of right kidney, except renal pelvis: Secondary | ICD-10-CM | POA: Diagnosis not present

## 2022-04-11 MED ORDER — SODIUM CHLORIDE 0.9% FLUSH
10.0000 mL | INTRAVENOUS | Status: DC | PRN
  Administered 2022-04-11: 10 mL

## 2022-04-11 MED ORDER — SODIUM CHLORIDE 0.9 % IV SOLN
480.0000 mg | Freq: Once | INTRAVENOUS | Status: AC
  Administered 2022-04-11: 480 mg via INTRAVENOUS
  Filled 2022-04-11: qty 48

## 2022-04-11 MED ORDER — SODIUM CHLORIDE 0.9 % IV SOLN: Freq: Once | INTRAVENOUS | Status: AC

## 2022-04-11 MED ORDER — HEPARIN SOD (PORK) LOCK FLUSH 100 UNIT/ML IV SOLN
500.0000 [IU] | Freq: Once | INTRAVENOUS | Status: AC | PRN
  Administered 2022-04-11: 500 [IU]

## 2022-04-11 NOTE — Patient Instructions (Signed)
Nivolumab injection ?What is this medication? ?NIVOLUMAB (nye VOL ue mab) is a monoclonal antibody. It treats certain types of cancer. Some of the cancers treated are colon cancer, head and neck cancer, Hodgkin lymphoma, lung cancer, and melanoma. ?This medicine may be used for other purposes; ask your health care provider or pharmacist if you have questions. ?COMMON BRAND NAME(S): Opdivo ?What should I tell my care team before I take this medication? ?They need to know if you have any of these conditions: ?Autoimmune diseases such as Crohn's disease, ulcerative colitis, or lupus ?Have had or planning to have an allogeneic stem cell transplant (uses someone else's stem cells) ?History of chest radiation ?Organ transplant ?Nervous system problems such as myasthenia gravis or Guillain-Barre syndrome ?An unusual or allergic reaction to nivolumab, other medicines, foods, dyes, or preservatives ?Pregnant or trying to get pregnant ?Breast-feeding ?How should I use this medication? ?This medication is injected into a vein. It is given in a hospital or clinic setting. ?A special MedGuide will be given to you before each treatment. Be sure to read this information carefully each time. ?Talk to your care team regarding the use of this medication in children. While it may be prescribed for children as young as 12 years for selected conditions, precautions do apply. ?Overdosage: If you think you have taken too much of this medicine contact a poison control center or emergency room at once. ?NOTE: This medicine is only for you. Do not share this medicine with others. ?What if I miss a dose? ?Keep appointments for follow-up doses. It is important not to miss your dose. Call your care team if you are unable to keep an appointment. ?What may interact with this medication? ?Interactions have not been studied. ?This list may not describe all possible interactions. Give your health care provider a list of all the medicines, herbs,  non-prescription drugs, or dietary supplements you use. Also tell them if you smoke, drink alcohol, or use illegal drugs. Some items may interact with your medicine. ?What should I watch for while using this medication? ?Your condition will be monitored carefully while you are receiving this medication. ?You may need blood work done while you are taking this medication. ?Do not become pregnant while taking this medication or for 5 months after stopping it. Women should inform their care team if they wish to become pregnant or think they might be pregnant. There is a potential for serious harm to an unborn child. Talk to your care team for more information. Do not breast-feed an infant while taking this medication or for 5 months after stopping it. ?What side effects may I notice from receiving this medication? ?Side effects that you should report to your care team as soon as possible: ?Allergic reactions--skin rash, itching, hives, swelling of the face, lips, tongue, or throat ?Bloody or black, tar-like stools ?Change in vision ?Chest pain ?Diarrhea ?Dry cough, shortness of breath or trouble breathing ?Eye pain ?Fast or irregular heartbeat ?Fever, chills ?High blood sugar (hyperglycemia)--increased thirst or amount of urine, unusual weakness or fatigue, blurry vision ?High thyroid levels (hyperthyroidism)--fast or irregular heartbeat, weight loss, excessive sweating or sensitivity to heat, tremors or shaking, anxiety, nervousness, irregular menstrual cycle or spotting ?Kidney injury--decrease in the amount of urine, swelling of the ankles, hands, or feet ?Liver injury--right upper belly pain, loss of appetite, nausea, light-colored stool, dark yellow or brown urine, yellowing skin or eyes, unusual weakness or fatigue ?Low red blood cell count--unusual weakness or fatigue, dizziness, headache, trouble breathing ?  Low thyroid levels (hypothyroidism)--unusual weakness or fatigue, increased sensitivity to cold,  constipation, hair loss, dry skin, weight gain, feelings of depression ?Mood and behavior changes-confusion, change in sex drive or performance, irritability ?Muscle pain or cramps ?Pain, tingling, or numbness in the hands or feet, muscle weakness, trouble walking, loss of balance or coordination ?Red or dark brown urine ?Redness, blistering, peeling, or loosening of the skin, including inside the mouth ?Stomach pain ?Unusual bruising or bleeding ?Side effects that usually do not require medical attention (report to your care team if they continue or are bothersome): ?Bone pain ?Constipation ?Loss of appetite ?Nausea ?Tiredness ?Vomiting ?This list may not describe all possible side effects. Call your doctor for medical advice about side effects. You may report side effects to FDA at 1-800-FDA-1088. ?Where should I keep my medication? ?This medication is given in a hospital or clinic and will not be stored at home. ?NOTE: This sheet is a summary. It may not cover all possible information. If you have questions about this medicine, talk to your doctor, pharmacist, or health care provider. ?? 2023 Elsevier/Gold Standard (2021-11-09 00:00:00) ? ?

## 2022-04-24 DIAGNOSIS — M7989 Other specified soft tissue disorders: Secondary | ICD-10-CM | POA: Diagnosis not present

## 2022-04-24 DIAGNOSIS — M25561 Pain in right knee: Secondary | ICD-10-CM | POA: Diagnosis not present

## 2022-04-24 DIAGNOSIS — S0121XA Laceration without foreign body of nose, initial encounter: Secondary | ICD-10-CM | POA: Diagnosis not present

## 2022-04-24 DIAGNOSIS — M25562 Pain in left knee: Secondary | ICD-10-CM | POA: Diagnosis not present

## 2022-04-24 DIAGNOSIS — S01512A Laceration without foreign body of oral cavity, initial encounter: Secondary | ICD-10-CM | POA: Diagnosis not present

## 2022-05-08 ENCOUNTER — Other Ambulatory Visit: Payer: Self-pay

## 2022-05-08 ENCOUNTER — Inpatient Hospital Stay: Payer: Medicare PPO

## 2022-05-08 ENCOUNTER — Inpatient Hospital Stay: Payer: Medicare PPO | Attending: Oncology | Admitting: Hematology and Oncology

## 2022-05-08 ENCOUNTER — Encounter: Payer: Self-pay | Admitting: Oncology

## 2022-05-08 ENCOUNTER — Encounter: Payer: Self-pay | Admitting: Hematology and Oncology

## 2022-05-08 ENCOUNTER — Other Ambulatory Visit: Payer: Self-pay | Admitting: Hematology and Oncology

## 2022-05-08 VITALS — BP 169/69 | HR 61 | Temp 98.2°F | Resp 14 | Ht 61.0 in | Wt 150.0 lb

## 2022-05-08 DIAGNOSIS — Z79899 Other long term (current) drug therapy: Secondary | ICD-10-CM | POA: Insufficient documentation

## 2022-05-08 DIAGNOSIS — D7589 Other specified diseases of blood and blood-forming organs: Secondary | ICD-10-CM

## 2022-05-08 DIAGNOSIS — Z5112 Encounter for antineoplastic immunotherapy: Secondary | ICD-10-CM | POA: Diagnosis not present

## 2022-05-08 DIAGNOSIS — C641 Malignant neoplasm of right kidney, except renal pelvis: Secondary | ICD-10-CM | POA: Diagnosis not present

## 2022-05-08 LAB — BASIC METABOLIC PANEL
BUN: 11 (ref 4–21)
CO2: 25 — AB (ref 13–22)
Chloride: 104 (ref 99–108)
Creatinine: 0.7 (ref 0.5–1.1)
Glucose: 107
Potassium: 3.9 mEq/L (ref 3.5–5.1)
Sodium: 140 (ref 137–147)

## 2022-05-08 LAB — CBC AND DIFFERENTIAL
HCT: 43 (ref 36–46)
Hemoglobin: 14.2 (ref 12.0–16.0)
Neutrophils Absolute: 3.96
Platelets: 184 10*3/uL (ref 150–400)
WBC: 6.6

## 2022-05-08 LAB — COMPREHENSIVE METABOLIC PANEL
Albumin: 4.6 (ref 3.5–5.0)
Calcium: 8.8 (ref 8.7–10.7)

## 2022-05-08 LAB — CBC
MCV: 100 — AB (ref 81–99)
RBC: 4.3 (ref 3.87–5.11)

## 2022-05-08 LAB — HEPATIC FUNCTION PANEL
ALT: 83 U/L — AB (ref 7–35)
AST: 86 — AB (ref 13–35)
Alkaline Phosphatase: 97 (ref 25–125)
Bilirubin, Total: 0.6

## 2022-05-08 LAB — TSH: TSH: 1.439 u[IU]/mL (ref 0.350–4.500)

## 2022-05-08 NOTE — Progress Notes (Cosign Needed)
?[facility]  ?[address] ?[place],  [place]  [address] ?[phone] ? ?Clinic Day:  05/08/2022 ? ?Referring physician: Mateo Flow, MD ? ? ?HISTORY OF PRESENT ILLNESS:  ?The patient is a 71 y.o. female with metastatic renal cell carcinoma, proven per a right-sided thoracentesis in early December 2021.  CT imaging in April after receiving 12 cycles of maintenance nivolumab revealed her disease to be stable.  The patient's maintenance nivolumab was preceded by 3 cycles of nivolumab/ipilimumab; a 4th cycle was not given due to her being hospitalized for severe colitis related to the ipilimumab component of her combination immunotherapy.  She reports fatigue.  She states her mild chronic cough and dyspnea are stable.  She has diarrhea on rare occasions.  She denies abdominal pain.  She denies having any new problems which concern her for disease progression.  She has had a rash of her face and leg, which has not worsened, and is controlled with topical medications from her dermatologist.  She states she recently resumed Crestor. ? ?PHYSICAL EXAM:  ?Blood pressure (!) 169/69, pulse 61, temperature 98.2 ?F (36.8 ?C), resp. rate 14, height 5\' 1"  (1.549 m), weight 150 lb (68 kg), SpO2 98 %. ?Wt Readings from Last 3 Encounters:  ?05/08/22 150 lb (68 kg)  ?04/11/22 151 lb 3.2 oz (68.6 kg)  ?04/10/22 151 lb 11.2 oz (68.8 kg)  ? ?Body mass index is 28.34 kg/m?. ? ?Performance status (ECOG): 1 - Symptomatic but completely ambulatory ? ?Physical Exam ?Vitals and nursing note reviewed.  ?Constitutional:   ?   General: She is not in acute distress. ?   Appearance: Normal appearance.  ?HENT:  ?   Head: Normocephalic and atraumatic.  ?   Mouth/Throat:  ?   Mouth: Mucous membranes are moist.  ?   Pharynx: Oropharynx is clear. No oropharyngeal exudate or posterior oropharyngeal erythema.  ?Eyes:  ?   General: No scleral icterus. ?   Extraocular Movements: Extraocular movements intact.  ?    Conjunctiva/sclera: Conjunctivae normal.  ?   Pupils: Pupils are equal, round, and reactive to light.  ?Cardiovascular:  ?   Rate and Rhythm: Normal rate and regular rhythm.  ?   Heart sounds: Normal heart sounds. No murmur heard. ?  No friction rub. No gallop.  ?Pulmonary:  ?   Effort: Pulmonary effort is normal.  ?   Breath sounds: Examination of the right-lower field reveals decreased breath sounds. Decreased breath sounds present. No wheezing, rhonchi or rales.  ?Abdominal:  ?   General: There is no distension.  ?   Palpations: Abdomen is soft. There is no hepatomegaly, splenomegaly or mass.  ?   Tenderness: There is no abdominal tenderness.  ?Musculoskeletal:     ?   General: Normal range of motion.  ?   Cervical back: Normal range of motion and neck supple. No tenderness.  ?   Right lower leg: No edema.  ?   Left lower leg: No edema.  ?Lymphadenopathy:  ?   Cervical: No cervical adenopathy.  ?   Upper Body:  ?   Right upper body: No supraclavicular or axillary adenopathy.  ?   Left upper body: No supraclavicular or axillary adenopathy.  ?   Lower Body: No right inguinal adenopathy. No left inguinal adenopathy.  ?Skin: ?   General: Skin is warm and dry.  ?   Coloration: Skin is not jaundiced.  ?   Findings: No rash.  ?Neurological:  ?  Mental Status: She is alert and oriented to person, place, and time.  ?   Cranial Nerves: No cranial nerve deficit.  ?Psychiatric:     ?   Mood and Affect: Mood normal.     ?   Behavior: Behavior normal.     ?   Thought Content: Thought content normal.  ? ? ?LABS:  ? ? ?  Latest Ref Rng & Units 05/08/2022  ? 12:00 AM 03/13/2022  ? 12:00 AM 02/13/2022  ? 12:00 AM  ?CBC  ?WBC  6.6      5.8      5.8    ?Hemoglobin 12.0 - 16.0 14.2      14.9      15.5    ?Hematocrit 36 - 46 43      45      46    ?Platelets 150 - 400 K/uL 184      172      185    ?  ? This result is from an external source.  ? ? ?  Latest Ref Rng & Units 05/08/2022  ? 12:00 AM 03/13/2022  ? 12:00 AM 02/13/2022  ? 12:00 AM   ?CMP  ?BUN 4 - 21 11      13      14     ?Creatinine 0.5 - 1.1 0.7      0.7      0.7    ?Sodium 137 - 147 140      139      142    ?Potassium 3.5 - 5.1 mEq/L 3.9      4.2      3.9    ?Chloride 99 - 108 104      106      108    ?CO2 13 - 22 25      28      24     ?Calcium 8.7 - 10.7 8.8      9.0      8.8    ?Alkaline Phos 25 - 125 97       86    ?AST 13 - 35 86      47      42    ?ALT 7 - 35 U/L 83      63      47    ?  ? This result is from an external source.  ? ?TSH ?Order: [id number] ?Status: Final result    ?Visible to patient: No (scheduled for 05/08/2022  3:15 PM)    ?Next appt: 05/09/2022 at 11:00 AM in Oncology ([facility])    ?Dx: Renal cell carcinoma of right kidney ...    ?0 Result Notes ?          ?Component Ref Range & Units 10:23 1 mo ago 2 mo ago 3 mo ago 4 mo ago 5 mo ago 6 mo ago  ?TSH 0.350 - 4.500 uIU/mL 1.439  1.937 CM  1.283 CM  1.774 CM  2.26 R  1.448 CM  1.929 CM   ?Comment: Performed by a 3rd Generation assay with a functional sensitivity of <=0.01 uIU/mL.  ?Performed at [facility], [place] [address], [place], [place] [address]   ?  ? ? ?No results found for: CEA1 / No results found for: CEA1 ?No results found for: PSA1 ?No results found for: UEK800 ?No results found for: LKJ179  ?No results found for: TOTALPROTELP, ALBUMINELP, A1GS, A2GS, BETS,  BETA2SER, GAMS, [place], [place] ?No results found for: TIBC, FERRITIN, IRONPCTSAT ?Lab Results  ?Component Value Date  ? LDH 180 11/28/2020  ? ? ?   ?Component Value Date/Time  ? LDH 180 11/28/2020 0955  ? ? ?Review Flowsheet   ? ?  ?  Latest Ref Rng & Units 11/28/2020  ?Oncology Labs  ?LDH 120 - 250 U/L 180    ?  ?  ?  ? ? ? ?STUDIES:  ?No results found.  ? ? ?ASSESSMENT & PLAN:  ? ?Assessment/Plan:  A 71 y.o. female with metastatic renal cell carcinoma.  Overall, she continues to do fairly well.  She has had further increase in the liver transaminases, but this may be associated with resuming Crestor.  We will continue to  monitor this.  She will proceed with her 14th cycle of maintenance nivolumab tomorrow, which she is receiving every 4 weeks. I will see her back in 4 weeks before she heads into her 15th cycle of maintenance nivolumab.  The patient understands all the plans discussed today and is in agreement with them.   ? ? ? ?Marvia Pickles, PA-C   ? ? ?   ?

## 2022-05-09 ENCOUNTER — Inpatient Hospital Stay: Payer: Medicare PPO

## 2022-05-09 ENCOUNTER — Ambulatory Visit: Payer: Medicare PPO

## 2022-05-09 VITALS — BP 132/72 | HR 69 | Temp 97.8°F | Resp 18 | Ht 61.0 in | Wt 154.2 lb

## 2022-05-09 DIAGNOSIS — Z5112 Encounter for antineoplastic immunotherapy: Secondary | ICD-10-CM | POA: Diagnosis not present

## 2022-05-09 DIAGNOSIS — D3001 Benign neoplasm of right kidney: Secondary | ICD-10-CM

## 2022-05-09 DIAGNOSIS — Z79899 Other long term (current) drug therapy: Secondary | ICD-10-CM | POA: Diagnosis not present

## 2022-05-09 DIAGNOSIS — C641 Malignant neoplasm of right kidney, except renal pelvis: Secondary | ICD-10-CM | POA: Diagnosis not present

## 2022-05-09 MED ORDER — SODIUM CHLORIDE 0.9 % IV SOLN
480.0000 mg | Freq: Once | INTRAVENOUS | Status: AC
  Administered 2022-05-09: 480 mg via INTRAVENOUS
  Filled 2022-05-09: qty 48

## 2022-05-09 MED ORDER — SODIUM CHLORIDE 0.9% FLUSH
10.0000 mL | INTRAVENOUS | Status: DC | PRN
  Administered 2022-05-09: 10 mL

## 2022-05-09 MED ORDER — HEPARIN SOD (PORK) LOCK FLUSH 100 UNIT/ML IV SOLN
500.0000 [IU] | Freq: Once | INTRAVENOUS | Status: AC | PRN
  Administered 2022-05-09: 500 [IU]

## 2022-05-09 MED ORDER — SODIUM CHLORIDE 0.9 % IV SOLN: Freq: Once | INTRAVENOUS | Status: AC

## 2022-05-09 NOTE — Patient Instructions (Signed)
[facility]  Discharge Instructions: Thank you for choosing [facility] to provide your oncology and hematology care.  If you have a lab appointment with the [facility], please go directly to the [facility] and check in at the registration area.   Wear comfortable clothing and clothing appropriate for easy access to any Portacath or PICC line.   We strive to give you quality time with your provider. You may need to reschedule your appointment if you arrive late (15 or more minutes).  Arriving late affects you and other patients whose appointments are after yours.  Also, if you miss three or more appointments without notifying the office, you may be dismissed from the clinic at the provider's discretion.      For prescription refill requests, have your pharmacy contact our office and allow 72 hours for refills to be completed.    Today you received the following chemotherapy and/or immunotherapy agents nivolumabNivolumab injection What is this medication? NIVOLUMAB (nye VOL ue mab) is a monoclonal antibody. It treats certain types of cancer. Some of the cancers treated are colon cancer, head and neck cancer, Hodgkin lymphoma, lung cancer, and melanoma. This medicine may be used for other purposes; ask your health care provider or pharmacist if you have questions. COMMON BRAND NAME(S): Opdivo What should I tell my care team before I take this medication? They need to know if you have any of these conditions: Autoimmune diseases such as Crohn's disease, ulcerative colitis, or lupus Have had or planning to have an allogeneic stem cell transplant (uses someone else's stem cells) History of chest radiation Organ transplant Nervous system problems such as myasthenia gravis or Guillain-Barre syndrome An unusual or allergic reaction to nivolumab, other medicines, foods, dyes, or preservatives Pregnant or trying to get pregnant Breast-feeding How should I  use this medication? This medication is injected into a vein. It is given in a hospital or clinic setting. A special MedGuide will be given to you before each treatment. Be sure to read this information carefully each time. Talk to your care team regarding the use of this medication in children. While it may be prescribed for children as young as 12 years for selected conditions, precautions do apply. Overdosage: If you think you have taken too much of this medicine contact a poison control center or emergency room at once. NOTE: This medicine is only for you. Do not share this medicine with others. What if I miss a dose? Keep appointments for follow-up doses. It is important not to miss your dose. Call your care team if you are unable to keep an appointment. What may interact with this medication? Interactions have not been studied. This list may not describe all possible interactions. Give your health care provider a list of all the medicines, herbs, non-prescription drugs, or dietary supplements you use. Also tell them if you smoke, drink alcohol, or use illegal drugs. Some items may interact with your medicine. What should I watch for while using this medication? Your condition will be monitored carefully while you are receiving this medication. You may need blood work done while you are taking this medication. Do not become pregnant while taking this medication or for 5 months after stopping it. Women should inform their care team if they wish to become pregnant or think they might be pregnant. There is a potential for serious harm to an unborn child. Talk to your care team for more information. Do not breast-feed an  infant while taking this medication or for 5 months after stopping it. What side effects may I notice from receiving this medication? Side effects that you should report to your care team as soon as possible: Allergic reactions--skin rash, itching, hives, swelling of the face,  lips, tongue, or throat Bloody or black, tar-like stools Change in vision Chest pain Diarrhea Dry cough, shortness of breath or trouble breathing Eye pain Fast or irregular heartbeat Fever, chills High blood sugar (hyperglycemia)--increased thirst or amount of urine, unusual weakness or fatigue, blurry vision High thyroid levels (hyperthyroidism)--fast or irregular heartbeat, weight loss, excessive sweating or sensitivity to heat, tremors or shaking, anxiety, nervousness, irregular menstrual cycle or spotting Kidney injury--decrease in the amount of urine, swelling of the ankles, hands, or feet Liver injury--right upper belly pain, loss of appetite, nausea, light-colored stool, dark yellow or brown urine, yellowing skin or eyes, unusual weakness or fatigue Low red blood cell count--unusual weakness or fatigue, dizziness, headache, trouble breathing Low thyroid levels (hypothyroidism)--unusual weakness or fatigue, increased sensitivity to cold, constipation, hair loss, dry skin, weight gain, feelings of depression Mood and behavior changes-confusion, change in sex drive or performance, irritability Muscle pain or cramps Pain, tingling, or numbness in the hands or feet, muscle weakness, trouble walking, loss of balance or coordination Red or dark brown urine Redness, blistering, peeling, or loosening of the skin, including inside the mouth Stomach pain Unusual bruising or bleeding Side effects that usually do not require medical attention (report to your care team if they continue or are bothersome): Bone pain Constipation Loss of appetite Nausea Tiredness Vomiting This list may not describe all possible side effects. Call your doctor for medical advice about side effects. You may report side effects to FDA at 1-800-FDA-1088. Where should I keep my medication? This medication is given in a hospital or clinic and will not be stored at home. NOTE: This sheet is a summary. It may not cover  all possible information. If you have questions about this medicine, talk to your doctor, pharmacist, or health care provider.  2023 Elsevier/Gold Standard (2021-11-09 00:00:00)       To help prevent nausea and vomiting after your treatment, we encourage you to take your nausea medication as directed.  BELOW ARE SYMPTOMS THAT SHOULD BE REPORTED IMMEDIATELY: *FEVER GREATER THAN 100.4 F (38 C) OR HIGHER *CHILLS OR SWEATING *NAUSEA AND VOMITING THAT IS NOT CONTROLLED WITH YOUR NAUSEA MEDICATION *UNUSUAL SHORTNESS OF BREATH *UNUSUAL BRUISING OR BLEEDING *URINARY PROBLEMS (pain or burning when urinating, or frequent urination) *BOWEL PROBLEMS (unusual diarrhea, constipation, pain near the anus) TENDERNESS IN MOUTH AND THROAT WITH OR WITHOUT PRESENCE OF ULCERS (sore throat, sores in mouth, or a toothache) UNUSUAL RASH, SWELLING OR PAIN  UNUSUAL VAGINAL DISCHARGE OR ITCHING   Items with * indicate a potential emergency and should be followed up as soon as possible or go to the Emergency Department if any problems should occur.  Please show the CHEMOTHERAPY ALERT CARD or IMMUNOTHERAPY ALERT CARD at check-in to the Emergency Department and triage nurse.  Should you have questions after your visit or need to cancel or reschedule your appointment, please contact [facility]  Dept: [phone]  and follow the prompts.  Office hours are 8:00 a.m. to 4:30 p.m. Monday - Friday. Please note that voicemails left after 4:00 p.m. may not be returned until the following business day.  We are closed weekends and major holidays. You have access to a nurse at all times for  urgent questions. Please call the main number to the clinic Dept: [phone] and follow the prompts.  For any non-urgent questions, you may also contact your provider using MyChart. We now offer e-Visits for anyone 48 and older to request care online for non-urgent symptoms. For details visit  [email or web address].   Also download the MyChart app! Go to the app store, search "MyChart", open the app, select [facility], and log in with your MyChart username and password.  Due to Covid, a mask is required upon entering the hospital/clinic. If you do not have a mask, one will be given to you upon arrival. For doctor visits, patients may have 1 support person aged 35 or older with them. For treatment visits, patients cannot have anyone with them due to current Covid guidelines and our immunocompromised population.

## 2022-05-16 DIAGNOSIS — H524 Presbyopia: Secondary | ICD-10-CM | POA: Diagnosis not present

## 2022-05-16 DIAGNOSIS — H5202 Hypermetropia, left eye: Secondary | ICD-10-CM | POA: Diagnosis not present

## 2022-05-16 DIAGNOSIS — H25813 Combined forms of age-related cataract, bilateral: Secondary | ICD-10-CM | POA: Diagnosis not present

## 2022-05-16 DIAGNOSIS — H5211 Myopia, right eye: Secondary | ICD-10-CM | POA: Diagnosis not present

## 2022-05-16 DIAGNOSIS — H52223 Regular astigmatism, bilateral: Secondary | ICD-10-CM | POA: Diagnosis not present

## 2022-05-27 DIAGNOSIS — Z6826 Body mass index (BMI) 26.0-26.9, adult: Secondary | ICD-10-CM | POA: Diagnosis not present

## 2022-05-27 DIAGNOSIS — L03119 Cellulitis of unspecified part of limb: Secondary | ICD-10-CM | POA: Diagnosis not present

## 2022-06-04 ENCOUNTER — Other Ambulatory Visit: Payer: Self-pay | Admitting: Hematology and Oncology

## 2022-06-04 DIAGNOSIS — D3001 Benign neoplasm of right kidney: Secondary | ICD-10-CM

## 2022-06-04 NOTE — Progress Notes (Signed)
[facility]  [address] [place],  [place]  [address] [phone]  Clinic Day:  06/05/2022  Referring physician: Mateo Flow, MD  HISTORY OF PRESENT ILLNESS:  The patient is a 71 y.o. female with metastatic renal cell carcinoma, proven per a right-sided thoracentesis in early December 2021.  She comes in today to be evaluated before heading into her 15th cycle of maintenance nivolumab.  The patient states that she tolerated her 14th cycle well without having any significant difficulties.  Of note, this patient's maintenance nivolumab was preceded by 3 cycles of nivolumab/ipilimumab; a 4th cycle was not given due to her being hospitalized for severe colitis related to the ipilimumab component of her combination immunotherapy.  She claims to still have an occasional cough.  Otherwise, she denies having any new problems which concern her for disease progression.     PHYSICAL EXAM:  Blood pressure (!) 160/70, pulse 76, temperature 97.8 F (36.6 C), resp. rate 16, height 5\' 1"  (1.549 m), weight 149 lb 3.2 oz (67.7 kg), SpO2 98 %. Wt Readings from Last 3 Encounters:  06/05/22 149 lb 3.2 oz (67.7 kg)  05/09/22 154 lb 4 oz (70 kg)  05/08/22 150 lb (68 kg)   Body mass index is 28.19 kg/m. Performance status (ECOG): 0 Physical Exam Constitutional:      Appearance: Normal appearance. She is not ill-appearing (She continues to look physically better vs previous visits).     Comments:    HENT:     Mouth/Throat:     Mouth: Mucous membranes are moist.     Pharynx: Oropharynx is clear. No oropharyngeal exudate or posterior oropharyngeal erythema.  Cardiovascular:     Rate and Rhythm: Normal rate and regular rhythm.     Heart sounds: No murmur heard.    No friction rub. No gallop.  Pulmonary:     Effort: Pulmonary effort is normal. No respiratory distress.     Breath sounds: Examination of the right-lower field reveals decreased breath sounds. Decreased  breath sounds present. No wheezing, rhonchi or rales.  Abdominal:     General: Bowel sounds are normal. There is no distension.     Palpations: Abdomen is soft. There is no mass.     Tenderness: There is no abdominal tenderness.  Musculoskeletal:        General: No swelling.     Right lower leg: No edema.     Left lower leg: No edema.  Lymphadenopathy:     Cervical: No cervical adenopathy.     Upper Body:     Right upper body: No supraclavicular or axillary adenopathy.     Left upper body: No supraclavicular or axillary adenopathy.     Lower Body: No right inguinal adenopathy. No left inguinal adenopathy.  Skin:    General: Skin is warm.     Coloration: Skin is not jaundiced.     Findings: Rash (facial rash seen) present. No lesion.  Neurological:     General: No focal deficit present.     Mental Status: She is alert and oriented to person, place, and time. Mental status is at baseline.  Psychiatric:        Mood and Affect: Mood normal.        Behavior: Behavior normal.        Thought Content: Thought content normal.    LABS:    Latest Reference Range & Units 06/05/22 00:00  Sodium 137 - 147  139 (E)  Potassium  3.5 - 5.1 mEq/L 4.0 (E)  Chloride 99 - 108  104 (E)  CO2 13 - 22  23 ! (E)  Glucose  140 (E)  BUN 4 - 21  13 (E)  Creatinine 0.5 - 1.1  0.8 (E)  Calcium 8.7 - 10.7  8.7 (E)  Alkaline Phosphatase 25 - 125  93 (E)  Albumin 3.5 - 5.0  4.6 (E)  AST 13 - 35  64 ! (E)  ALT 7 - 35 U/L 68 ! (E)  Bilirubin, Total  0.6 (E)  WBC  6.9 (E)  RBC 3.87 - 5.11  4.28 (E)  Hemoglobin 12.0 - 16.0  13.9 (E)  HCT 36 - 46  43 (E)  Platelets 150 - 400 K/uL 182 (E)  NEUT#  4.35 (E)  !: Data is abnormal (E): External lab result  ASSESSMENT & PLAN:  A 71 y.o. female with metastatic renal cell carcinoma.  She will proceed with her 15th cycle of maintenance nivolumab tomorrow, which she is receiving every 4 weeks. Overall, she clinically appears to be doing well.  I will see her back  in 4 weeks before she heads into her 16th cycle of maintenance nivolumab.  The patient understands all the plans discussed today and is in agreement with them.   Khair Chasteen Macarthur Critchley, MD

## 2022-06-05 ENCOUNTER — Inpatient Hospital Stay: Payer: Medicare PPO | Attending: Oncology

## 2022-06-05 ENCOUNTER — Inpatient Hospital Stay: Payer: Medicare PPO | Admitting: Oncology

## 2022-06-05 ENCOUNTER — Telehealth: Payer: Self-pay | Admitting: Oncology

## 2022-06-05 ENCOUNTER — Other Ambulatory Visit: Payer: Self-pay

## 2022-06-05 VITALS — BP 160/70 | HR 76 | Temp 97.8°F | Resp 16 | Ht 61.0 in | Wt 149.2 lb

## 2022-06-05 DIAGNOSIS — Z5112 Encounter for antineoplastic immunotherapy: Secondary | ICD-10-CM | POA: Insufficient documentation

## 2022-06-05 DIAGNOSIS — Z79899 Other long term (current) drug therapy: Secondary | ICD-10-CM | POA: Diagnosis not present

## 2022-06-05 DIAGNOSIS — D7589 Other specified diseases of blood and blood-forming organs: Secondary | ICD-10-CM | POA: Diagnosis not present

## 2022-06-05 DIAGNOSIS — C641 Malignant neoplasm of right kidney, except renal pelvis: Secondary | ICD-10-CM | POA: Insufficient documentation

## 2022-06-05 DIAGNOSIS — D3001 Benign neoplasm of right kidney: Secondary | ICD-10-CM

## 2022-06-05 LAB — BASIC METABOLIC PANEL
BUN: 13 (ref 4–21)
CO2: 23 — AB (ref 13–22)
Chloride: 104 (ref 99–108)
Creatinine: 0.8 (ref 0.5–1.1)
Glucose: 140
Potassium: 4 mEq/L (ref 3.5–5.1)
Sodium: 139 (ref 137–147)

## 2022-06-05 LAB — COMPREHENSIVE METABOLIC PANEL
Albumin: 4.6 (ref 3.5–5.0)
Calcium: 8.7 (ref 8.7–10.7)

## 2022-06-05 LAB — CBC: RBC: 4.28 (ref 3.87–5.11)

## 2022-06-05 LAB — HEPATIC FUNCTION PANEL
ALT: 68 U/L — AB (ref 7–35)
AST: 64 — AB (ref 13–35)
Alkaline Phosphatase: 93 (ref 25–125)
Bilirubin, Total: 0.6

## 2022-06-05 LAB — CBC AND DIFFERENTIAL
HCT: 43 (ref 36–46)
Hemoglobin: 13.9 (ref 12.0–16.0)
Neutrophils Absolute: 4.35
Platelets: 182 10*3/uL (ref 150–400)
WBC: 6.9

## 2022-06-05 LAB — FOLATE: Folate: 12.1 ng/mL (ref >5.9)

## 2022-06-05 LAB — TSH: TSH: 1.037 u[IU]/mL (ref 0.350–4.500)

## 2022-06-05 NOTE — Telephone Encounter (Signed)
Per 06/05/22 los next appt scheduled and confirmed with patient

## 2022-06-06 ENCOUNTER — Inpatient Hospital Stay: Payer: Medicare PPO

## 2022-06-06 VITALS — BP 142/76 | HR 75 | Temp 98.3°F | Resp 18 | Ht 61.0 in | Wt 148.5 lb

## 2022-06-06 DIAGNOSIS — D3001 Benign neoplasm of right kidney: Secondary | ICD-10-CM

## 2022-06-06 DIAGNOSIS — Z5112 Encounter for antineoplastic immunotherapy: Secondary | ICD-10-CM | POA: Diagnosis not present

## 2022-06-06 DIAGNOSIS — C641 Malignant neoplasm of right kidney, except renal pelvis: Secondary | ICD-10-CM | POA: Diagnosis not present

## 2022-06-06 DIAGNOSIS — Z79899 Other long term (current) drug therapy: Secondary | ICD-10-CM | POA: Diagnosis not present

## 2022-06-06 MED ORDER — SODIUM CHLORIDE 0.9 % IV SOLN
480.0000 mg | Freq: Once | INTRAVENOUS | Status: AC
  Administered 2022-06-06: 480 mg via INTRAVENOUS
  Filled 2022-06-06: qty 48

## 2022-06-06 MED ORDER — HEPARIN SOD (PORK) LOCK FLUSH 100 UNIT/ML IV SOLN
500.0000 [IU] | Freq: Once | INTRAVENOUS | Status: AC | PRN
  Administered 2022-06-06: 500 [IU]

## 2022-06-06 MED ORDER — SODIUM CHLORIDE 0.9 % IV SOLN: Freq: Once | INTRAVENOUS | Status: AC

## 2022-06-06 MED ORDER — SODIUM CHLORIDE 0.9% FLUSH
10.0000 mL | INTRAVENOUS | Status: DC | PRN
  Administered 2022-06-06: 10 mL

## 2022-06-06 NOTE — Patient Instructions (Signed)
[facility]  Discharge Instructions: Thank you for choosing [facility] to provide your oncology and hematology care.  If you have a lab appointment with the [facility], please go directly to the [facility] and check in at the registration area.   Wear comfortable clothing and clothing appropriate for easy access to any Portacath or PICC line.   We strive to give you quality time with your provider. You may need to reschedule your appointment if you arrive late (15 or more minutes).  Arriving late affects you and other patients whose appointments are after yours.  Also, if you miss three or more appointments without notifying the office, you may be dismissed from the clinic at the provider's discretion.      For prescription refill requests, have your pharmacy contact our office and allow 72 hours for refills to be completed.    Today you received the following chemotherapy and/or immunotherapy agents nivolumabAntibiotic Medicine, Adult  Antibiotic medicines treat infections caused by a type of germ called bacteria. These medicines work by killing the bacteria that make you sick. You should take antibiotic medicines safely and only when needed. When do I need to take antibiotics? You may need antibiotics for: A urinary tract infection (UTI). Strep throat. A sinus infection caused by bacteria. Meningitis. This affects the spinal cord and brain. A bad lung infection. You may start your medicines while your doctor waits for your results on some tests. When your results come back, your doctor may change or stop your medicine based on your test results. When are antibiotics not needed? You do not need these medicines for most common illnesses, such as: A cold. The flu. A sore throat. Mucus being an odd color. Bronchitis. Sometimes, antibiotics are not needed for an infection caused by bacteria. Do not ask for these medicines, or take them, when  they are not needed. How long should I take my antibiotic? You need to take all your medicine. Take your antibiotic medicine as told by your doctor. Do not stop taking the antibiotic even if you start to feel better. If you stop taking it too soon: You may feel sick again. Your infection may get harder to treat. Antibiotics need different amounts of time to work. Some treatments last just a few days. Some last about a week to 10 days. Sometimes, you may need to take antibiotics for a few weeks to fully treat your infection. What if I miss a dose? Try not to miss a dose. If you miss a dose, call your doctor or pharmacist. Sometimes, it is okay to take the missed dose as soon as you can. Do not take an extra dose. What are the risks of taking antibiotics? Antibiotics can cause: Allergic attacks. A feeling like you may vomit (nausea). Yeast infections. Liver problems. These medicines can cause an infection called C. diff. This causes watery poop (diarrhea). This happens when antibiotics kill good germs in your gut. This lets C. diff grow. Tell your doctor right away if: You get watery poop while taking your antibiotic. You get watery poop after you stop your antibiotic. C. diff can happen weeks after you stop your medicine. You also have a risk of getting an infection in the future that antibiotics cannot treat (antibiotic-resistant infection). These infections can get very bad. Sometimes, they can be life-threatening. Do antibiotics affect birth control? Birth control pills may not work. If you take birth control pills: Keep taking them as  normal. Use a second form of birth control, such as a condom. Do this for as long as told by your doctor. What else should I know about taking antibiotics? You need to take these medicines exactly as told. Make sure to do these things: Take the right amount of medicine at the same time each day. Ask your doctor: How long to wait between doses. If you  should take your medicine with food. If you should stay away from some foods, drinks, or medicines. What side effects you should watch for. Use only the medicines that your doctor said to use. Do not use medicines that were given to someone else. Drink a large glass of water when you take your medicine. Drink enough fluid to keep your pee (urine) pale yellow. Ask your pharmacist for a tool to measure your medicine. This may be a syringe, cup, or spoon. Throw out any extra medicine. Follow these instructions at home: Take over-the-counter and prescription medicines as told by your doctor. Return to your normal activities as told by your doctor. Ask your doctor what activities are safe for you. Keep all follow-up visits as told by your doctor. This is important. Contact a doctor if: You feel worse. You have one of these after you start your medicine: New joint pain. New muscle aches. You have side effects from your medicine, such as: Stomach pain. Watery poop. Feeling like you may vomit. White patches in your mouth or throat. Get help right away if: You have a very bad allergic attack. If this happens, stop taking your medicine right away. You may get: Hives. These are raised, itchy, red bumps on your skin. Skin rash. Trouble breathing. Breathing that has whistling sounds. Swelling on your body. A dizzy feeling. Vomiting. You have symptoms of liver problems. You may have: Dark pee, or pee that is the color of blood. Yellow skin. Easy bruising. Easy bleeding. You have very bad watery poop. You have cramps in your belly. You have a very bad headache. These symptoms may be an emergency. Do not wait to see if the symptoms will go away. Get medical help right away. Call your local emergency services (911 in the [place] Do not drive yourself to the hospital. Summary Antibiotics are used to treat infections caused by bacteria. Take these medicines safely and only when needed. Your  doctor may change or stop your medicine based on your test results. Take all your medicine even when you feel better. This information is not intended to replace advice given to you by your health care provider. Make sure you discuss any questions you have with your health care provider. Document Revised: 09/28/2019 Document Reviewed: 09/28/2019 Elsevier Patient Education  [place].       To help prevent nausea and vomiting after your treatment, we encourage you to take your nausea medication as directed.  BELOW ARE SYMPTOMS THAT SHOULD BE REPORTED IMMEDIATELY: *FEVER GREATER THAN 100.4 F (38 C) OR HIGHER *CHILLS OR SWEATING *NAUSEA AND VOMITING THAT IS NOT CONTROLLED WITH YOUR NAUSEA MEDICATION *UNUSUAL SHORTNESS OF BREATH *UNUSUAL BRUISING OR BLEEDING *URINARY PROBLEMS (pain or burning when urinating, or frequent urination) *BOWEL PROBLEMS (unusual diarrhea, constipation, pain near the anus) TENDERNESS IN MOUTH AND THROAT WITH OR WITHOUT PRESENCE OF ULCERS (sore throat, sores in mouth, or a toothache) UNUSUAL RASH, SWELLING OR PAIN  UNUSUAL VAGINAL DISCHARGE OR ITCHING   Items with * indicate a potential emergency and should be followed up as soon as possible or go  to the Emergency Department if any problems should occur.  Please show the CHEMOTHERAPY ALERT CARD or IMMUNOTHERAPY ALERT CARD at check-in to the Emergency Department and triage nurse.  Should you have questions after your visit or need to cancel or reschedule your appointment, please contact [facility]  Dept: [phone]  and follow the prompts.  Office hours are 8:00 a.m. to 4:30 p.m. Monday - Friday. Please note that voicemails left after 4:00 p.m. may not be returned until the following business day.  We are closed weekends and major holidays. You have access to a nurse at all times for urgent questions. Please call the main number to the clinic Dept: [phone] and follow the  prompts.  For any non-urgent questions, you may also contact your provider using MyChart. We now offer e-Visits for anyone 32 and older to request care online for non-urgent symptoms. For details visit [email or web address].   Also download the MyChart app! Go to the app store, search "MyChart", open the app, select [facility], and log in with your MyChart username and password.  Masks are optional in the cancer centers. If you would like for your care team to wear a mask while they are taking care of you, please let them know. For doctor visits, patients may have with them one support person who is at least 73 years old. At this time, visitors are not allowed in the infusion area.

## 2022-07-02 NOTE — Progress Notes (Signed)
[facility]  [address] [place],  [place]  [address] [phone]  Clinic Day:  07/03/2022  Referring physician: Mateo Flow, MD  HISTORY OF PRESENT ILLNESS:  The patient is a 71 y.o. female with metastatic renal cell carcinoma, proven per a right-sided thoracentesis in early December 2021.  She comes in today to be evaluated before heading into her 16th cycle of maintenance nivolumab.  The patient states that she tolerated her 15th cycle well without having any significant difficulties.  Of note, this patient's maintenance nivolumab was preceded by 3 cycles of nivolumab/ipilimumab; a 4th cycle was not given due to her being hospitalized for severe colitis related to the ipilimumab component of her combination immunotherapy.  She claims to still have an occasional cough.  Otherwise, she denies having any new problems which concern her for disease progression.     PHYSICAL EXAM:  Blood pressure (!) 187/85, pulse 74, temperature 98 F (36.7 C), resp. rate 16, height 5\' 1"  (1.549 m), weight 149 lb 4.8 oz (67.7 kg), SpO2 98 %. Wt Readings from Last 3 Encounters:  07/03/22 149 lb 4.8 oz (67.7 kg)  06/06/22 148 lb 8 oz (67.4 kg)  06/05/22 149 lb 3.2 oz (67.7 kg)   Body mass index is 28.21 kg/m. Performance status (ECOG): 0 Physical Exam Constitutional:      Appearance: Normal appearance. She is not ill-appearing (She continues to look physically better vs previous visits).     Comments:    HENT:     Mouth/Throat:     Mouth: Mucous membranes are moist.     Pharynx: Oropharynx is clear. No oropharyngeal exudate or posterior oropharyngeal erythema.  Cardiovascular:     Rate and Rhythm: Normal rate and regular rhythm.     Heart sounds: No murmur heard.    No friction rub. No gallop.  Pulmonary:     Effort: Pulmonary effort is normal. No respiratory distress.     Breath sounds: Examination of the right-lower field reveals decreased breath sounds.  Decreased breath sounds present. No wheezing, rhonchi or rales.  Abdominal:     General: Bowel sounds are normal. There is no distension.     Palpations: Abdomen is soft. There is no mass.     Tenderness: There is no abdominal tenderness.  Musculoskeletal:        General: No swelling.     Right lower leg: No edema.     Left lower leg: No edema.  Lymphadenopathy:     Cervical: No cervical adenopathy.     Upper Body:     Right upper body: No supraclavicular or axillary adenopathy.     Left upper body: No supraclavicular or axillary adenopathy.     Lower Body: No right inguinal adenopathy. No left inguinal adenopathy.  Skin:    General: Skin is warm.     Coloration: Skin is not jaundiced.     Findings: Rash (facial rash seen) present. No lesion.  Neurological:     General: No focal deficit present.     Mental Status: She is alert and oriented to person, place, and time. Mental status is at baseline.  Psychiatric:        Mood and Affect: Mood normal.        Behavior: Behavior normal.        Thought Content: Thought content normal.    LABS:    Latest Reference Range & Units 07/03/22 00:00  Sodium 137 - 147  140 (E)  Potassium 3.5 - 5.1 mEq/L 3.7 (E)  Chloride 99 - 108  105 (E)  CO2 13 - 22  28 ! (E)  Glucose  119 (E)  BUN 4 - 21  11 (E)  Creatinine 0.5 - 1.1  0.7 (E)  Calcium 8.7 - 10.7  8.8 (E)  Alkaline Phosphatase 25 - 125  93 (E)  Albumin 3.5 - 5.0  4.4 (E)  AST 13 - 35  50 ! (E)  ALT 7 - 35 U/L 51 ! (E)  Bilirubin, Total  0.6 (E)  WBC  6.8 (E)  RBC 3.87 - 5.11  4.06 (E)  Hemoglobin 12.0 - 16.0  13.7 (E)  HCT 36 - 46  41 (E)  Platelets 150 - 400 K/uL 174 (E)  NEUT#  3.81 (E)  !: Data is abnormal (E): External lab result  ASSESSMENT & PLAN:  A 71 y.o. female with metastatic renal cell carcinoma.  She will proceed with her 16th cycle of maintenance nivolumab tomorrow, which she is receiving every 4 weeks. Overall, she clinically appears to be doing well.  I will see  her back in 4 weeks before she heads into her 17th cycle of maintenance nivolumab.  CT scans will be done a day before her next visit to ascertain her new disease baseline after 16 cycles of maintenance nivolumab immunotherapy.  The patient understands all the plans discussed today and is in agreement with them.   Hiroyuki Ozanich Macarthur Critchley, MD

## 2022-07-03 ENCOUNTER — Other Ambulatory Visit: Payer: Self-pay | Admitting: Hematology and Oncology

## 2022-07-03 ENCOUNTER — Inpatient Hospital Stay: Payer: Medicare PPO | Attending: Oncology | Admitting: Oncology

## 2022-07-03 ENCOUNTER — Other Ambulatory Visit: Payer: Self-pay | Admitting: Oncology

## 2022-07-03 ENCOUNTER — Inpatient Hospital Stay: Payer: Medicare PPO

## 2022-07-03 VITALS — BP 187/85 | HR 74 | Temp 98.0°F | Resp 16 | Ht 61.0 in | Wt 149.3 lb

## 2022-07-03 DIAGNOSIS — D3001 Benign neoplasm of right kidney: Secondary | ICD-10-CM

## 2022-07-03 DIAGNOSIS — Z79899 Other long term (current) drug therapy: Secondary | ICD-10-CM | POA: Insufficient documentation

## 2022-07-03 DIAGNOSIS — C641 Malignant neoplasm of right kidney, except renal pelvis: Secondary | ICD-10-CM

## 2022-07-03 DIAGNOSIS — Z5112 Encounter for antineoplastic immunotherapy: Secondary | ICD-10-CM | POA: Insufficient documentation

## 2022-07-03 DIAGNOSIS — C649 Malignant neoplasm of unspecified kidney, except renal pelvis: Secondary | ICD-10-CM | POA: Insufficient documentation

## 2022-07-03 DIAGNOSIS — D649 Anemia, unspecified: Secondary | ICD-10-CM | POA: Diagnosis not present

## 2022-07-03 LAB — BASIC METABOLIC PANEL
BUN: 11 (ref 4–21)
CO2: 28 — AB (ref 13–22)
Chloride: 105 (ref 99–108)
Creatinine: 0.7 (ref 0.5–1.1)
Glucose: 119
Potassium: 3.7 mEq/L (ref 3.5–5.1)
Sodium: 140 (ref 137–147)

## 2022-07-03 LAB — CBC AND DIFFERENTIAL
HCT: 41 (ref 36–46)
Hemoglobin: 13.7 (ref 12.0–16.0)
Neutrophils Absolute: 3.81
Platelets: 174 10*3/uL (ref 150–400)
WBC: 6.8

## 2022-07-03 LAB — HEPATIC FUNCTION PANEL
ALT: 51 U/L — AB (ref 7–35)
AST: 50 — AB (ref 13–35)
Alkaline Phosphatase: 93 (ref 25–125)
Bilirubin, Total: 0.6

## 2022-07-03 LAB — COMPREHENSIVE METABOLIC PANEL
Albumin: 4.4 (ref 3.5–5.0)
Calcium: 8.8 (ref 8.7–10.7)

## 2022-07-03 LAB — TSH: TSH: 2.105 u[IU]/mL (ref 0.350–4.500)

## 2022-07-03 LAB — CBC: RBC: 4.06 (ref 3.87–5.11)

## 2022-07-03 MED FILL — Nivolumab IV Soln 100 MG/10ML: INTRAVENOUS | Qty: 48 | Status: AC

## 2022-07-04 ENCOUNTER — Inpatient Hospital Stay: Payer: Medicare PPO

## 2022-07-04 VITALS — BP 132/74 | HR 88 | Temp 98.0°F | Resp 18 | Ht 61.0 in | Wt 149.1 lb

## 2022-07-04 DIAGNOSIS — C649 Malignant neoplasm of unspecified kidney, except renal pelvis: Secondary | ICD-10-CM | POA: Diagnosis not present

## 2022-07-04 DIAGNOSIS — Z79899 Other long term (current) drug therapy: Secondary | ICD-10-CM | POA: Diagnosis not present

## 2022-07-04 DIAGNOSIS — Z5112 Encounter for antineoplastic immunotherapy: Secondary | ICD-10-CM | POA: Diagnosis not present

## 2022-07-04 DIAGNOSIS — D3001 Benign neoplasm of right kidney: Secondary | ICD-10-CM

## 2022-07-04 MED ORDER — SODIUM CHLORIDE 0.9% FLUSH
10.0000 mL | INTRAVENOUS | Status: DC | PRN
  Administered 2022-07-04: 10 mL

## 2022-07-04 MED ORDER — SODIUM CHLORIDE 0.9 % IV SOLN
480.0000 mg | Freq: Once | INTRAVENOUS | Status: AC
  Administered 2022-07-04: 480 mg via INTRAVENOUS
  Filled 2022-07-04: qty 48

## 2022-07-04 MED ORDER — SODIUM CHLORIDE 0.9 % IV SOLN: Freq: Once | INTRAVENOUS | Status: AC

## 2022-07-04 MED ORDER — HEPARIN SOD (PORK) LOCK FLUSH 100 UNIT/ML IV SOLN
500.0000 [IU] | Freq: Once | INTRAVENOUS | Status: AC | PRN
  Administered 2022-07-04: 500 [IU]

## 2022-07-04 NOTE — Patient Instructions (Signed)
Nivolumab injection What is this medication? NIVOLUMAB (nye VOL ue mab) is a monoclonal antibody. It treats certain types of cancer. Some of the cancers treated are colon cancer, head and neck cancer, Hodgkin lymphoma, lung cancer, and melanoma. This medicine may be used for other purposes; ask your health care provider or pharmacist if you have questions. COMMON BRAND NAME(S): Opdivo What should I tell my care team before I take this medication? They need to know if you have any of these conditions: Autoimmune diseases such as Crohn's disease, ulcerative colitis, or lupus Have had or planning to have an allogeneic stem cell transplant (uses someone else's stem cells) History of chest radiation Organ transplant Nervous system problems such as myasthenia gravis or Guillain-Barre syndrome An unusual or allergic reaction to nivolumab, other medicines, foods, dyes, or preservatives Pregnant or trying to get pregnant Breast-feeding How should I use this medication? This medication is injected into a vein. It is given in a hospital or clinic setting. A special MedGuide will be given to you before each treatment. Be sure to read this information carefully each time. Talk to your care team regarding the use of this medication in children. While it may be prescribed for children as young as 12 years for selected conditions, precautions do apply. Overdosage: If you think you have taken too much of this medicine contact a poison control center or emergency room at once. NOTE: This medicine is only for you. Do not share this medicine with others. What if I miss a dose? Keep appointments for follow-up doses. It is important not to miss your dose. Call your care team if you are unable to keep an appointment. What may interact with this medication? Interactions have not been studied. This list may not describe all possible interactions. Give your health care provider a list of all the medicines, herbs,  non-prescription drugs, or dietary supplements you use. Also tell them if you smoke, drink alcohol, or use illegal drugs. Some items may interact with your medicine. What should I watch for while using this medication? Your condition will be monitored carefully while you are receiving this medication. You may need blood work done while you are taking this medication. Do not become pregnant while taking this medication or for 5 months after stopping it. Women should inform their care team if they wish to become pregnant or think they might be pregnant. There is a potential for serious harm to an unborn child. Talk to your care team for more information. Do not breast-feed an infant while taking this medication or for 5 months after stopping it. What side effects may I notice from receiving this medication? Side effects that you should report to your care team as soon as possible: Allergic reactions--skin rash, itching, hives, swelling of the face, lips, tongue, or throat Bloody or black, tar-like stools Change in vision Chest pain Diarrhea Dry cough, shortness of breath or trouble breathing Eye pain Fast or irregular heartbeat Fever, chills High blood sugar (hyperglycemia)--increased thirst or amount of urine, unusual weakness or fatigue, blurry vision High thyroid levels (hyperthyroidism)--fast or irregular heartbeat, weight loss, excessive sweating or sensitivity to heat, tremors or shaking, anxiety, nervousness, irregular menstrual cycle or spotting Kidney injury--decrease in the amount of urine, swelling of the ankles, hands, or feet Liver injury--right upper belly pain, loss of appetite, nausea, light-colored stool, dark yellow or brown urine, yellowing skin or eyes, unusual weakness or fatigue Low red blood cell count--unusual weakness or fatigue, dizziness, headache, trouble breathing  Low thyroid levels (hypothyroidism)--unusual weakness or fatigue, increased sensitivity to cold,  constipation, hair loss, dry skin, weight gain, feelings of depression Mood and behavior changes-confusion, change in sex drive or performance, irritability Muscle pain or cramps Pain, tingling, or numbness in the hands or feet, muscle weakness, trouble walking, loss of balance or coordination Red or dark brown urine Redness, blistering, peeling, or loosening of the skin, including inside the mouth Stomach pain Unusual bruising or bleeding Side effects that usually do not require medical attention (report to your care team if they continue or are bothersome): Bone pain Constipation Loss of appetite Nausea Tiredness Vomiting This list may not describe all possible side effects. Call your doctor for medical advice about side effects. You may report side effects to FDA at 1-800-FDA-1088. Where should I keep my medication? This medication is given in a hospital or clinic and will not be stored at home. NOTE: This sheet is a summary. It may not cover all possible information. If you have questions about this medicine, talk to your doctor, pharmacist, or health care provider.  2023 Elsevier/Gold Standard (2021-11-09 00:00:00)

## 2022-07-10 DIAGNOSIS — H8109 Meniere's disease, unspecified ear: Secondary | ICD-10-CM | POA: Diagnosis not present

## 2022-07-15 ENCOUNTER — Telehealth: Payer: Self-pay | Admitting: Oncology

## 2022-07-15 ENCOUNTER — Other Ambulatory Visit: Payer: Self-pay

## 2022-07-15 NOTE — Telephone Encounter (Signed)
CT C/A/P has been scheduled for 07/30/22 @ 10:30 ; Checking in @ 9:30 am  LVM notifying pt of date,time and instructions.

## 2022-07-30 DIAGNOSIS — J9811 Atelectasis: Secondary | ICD-10-CM | POA: Diagnosis not present

## 2022-07-30 DIAGNOSIS — J439 Emphysema, unspecified: Secondary | ICD-10-CM | POA: Diagnosis not present

## 2022-07-30 DIAGNOSIS — K7689 Other specified diseases of liver: Secondary | ICD-10-CM | POA: Diagnosis not present

## 2022-07-30 DIAGNOSIS — N2889 Other specified disorders of kidney and ureter: Secondary | ICD-10-CM | POA: Diagnosis not present

## 2022-07-30 DIAGNOSIS — I700 Atherosclerosis of aorta: Secondary | ICD-10-CM | POA: Diagnosis not present

## 2022-07-30 DIAGNOSIS — C641 Malignant neoplasm of right kidney, except renal pelvis: Secondary | ICD-10-CM | POA: Diagnosis not present

## 2022-07-30 DIAGNOSIS — K820 Obstruction of gallbladder: Secondary | ICD-10-CM | POA: Diagnosis not present

## 2022-07-30 DIAGNOSIS — J90 Pleural effusion, not elsewhere classified: Secondary | ICD-10-CM | POA: Diagnosis not present

## 2022-07-30 LAB — BASIC METABOLIC PANEL
BUN: 10 (ref 4–21)
CO2: 26 — AB (ref 13–22)
Chloride: 103 (ref 99–108)
Creatinine: 0.6 (ref 0.5–1.1)
Glucose: 87
Potassium: 4.3 mEq/L (ref 3.5–5.1)
Sodium: 138 (ref 137–147)

## 2022-07-30 LAB — CBC AND DIFFERENTIAL
HCT: 45 (ref 36–46)
Hemoglobin: 15 (ref 12.0–16.0)
Neutrophils Absolute: 3.83
Platelets: 196 10*3/uL (ref 150–400)
WBC: 7.1

## 2022-07-30 LAB — HEPATIC FUNCTION PANEL
ALT: 45 U/L — AB (ref 7–35)
AST: 50 — AB (ref 13–35)
Alkaline Phosphatase: 83 (ref 25–125)
Bilirubin, Total: 0.6

## 2022-07-30 LAB — COMPREHENSIVE METABOLIC PANEL
Albumin: 4.8 (ref 3.5–5.0)
Calcium: 9.4 (ref 8.7–10.7)

## 2022-07-30 LAB — TSH: TSH: 1.98 (ref 0.41–5.90)

## 2022-07-30 LAB — CBC: RBC: 4.45 (ref 3.87–5.11)

## 2022-07-31 ENCOUNTER — Telehealth: Payer: Self-pay | Admitting: Hematology and Oncology

## 2022-07-31 ENCOUNTER — Inpatient Hospital Stay: Payer: Medicare PPO | Attending: Oncology | Admitting: Hematology and Oncology

## 2022-07-31 ENCOUNTER — Other Ambulatory Visit: Payer: Self-pay | Admitting: Oncology

## 2022-07-31 ENCOUNTER — Encounter: Payer: Self-pay | Admitting: Oncology

## 2022-07-31 ENCOUNTER — Encounter: Payer: Self-pay | Admitting: Hematology and Oncology

## 2022-07-31 VITALS — BP 162/73 | HR 91 | Temp 98.4°F | Resp 14 | Ht 61.0 in | Wt 147.4 lb

## 2022-07-31 DIAGNOSIS — J910 Malignant pleural effusion: Secondary | ICD-10-CM | POA: Insufficient documentation

## 2022-07-31 DIAGNOSIS — Z5112 Encounter for antineoplastic immunotherapy: Secondary | ICD-10-CM | POA: Insufficient documentation

## 2022-07-31 DIAGNOSIS — C641 Malignant neoplasm of right kidney, except renal pelvis: Secondary | ICD-10-CM | POA: Diagnosis not present

## 2022-07-31 DIAGNOSIS — J90 Pleural effusion, not elsewhere classified: Secondary | ICD-10-CM | POA: Diagnosis not present

## 2022-07-31 DIAGNOSIS — R0782 Intercostal pain: Secondary | ICD-10-CM | POA: Diagnosis not present

## 2022-07-31 DIAGNOSIS — Z79899 Other long term (current) drug therapy: Secondary | ICD-10-CM | POA: Insufficient documentation

## 2022-07-31 DIAGNOSIS — M954 Acquired deformity of chest and rib: Secondary | ICD-10-CM | POA: Diagnosis not present

## 2022-07-31 DIAGNOSIS — J939 Pneumothorax, unspecified: Secondary | ICD-10-CM | POA: Diagnosis not present

## 2022-07-31 DIAGNOSIS — R918 Other nonspecific abnormal finding of lung field: Secondary | ICD-10-CM | POA: Diagnosis not present

## 2022-07-31 LAB — CBC: MCV: 100 — AB (ref 81–99)

## 2022-07-31 MED ORDER — HYDROCODONE-ACETAMINOPHEN 5-325 MG PO TABS
1.0000 | ORAL_TABLET | Freq: Four times a day (QID) | ORAL | 0 refills | Status: DC | PRN
Start: 2022-07-31 — End: 2023-05-06

## 2022-07-31 MED FILL — Nivolumab IV Soln 100 MG/10ML: INTRAVENOUS | Qty: 48 | Status: AC

## 2022-07-31 NOTE — Progress Notes (Cosign Needed)
[name] [name]  [address] [place],  [place]  [address] [phone]  Clinic Day:  07/31/2022  Referring physician: Mateo Flow, MD   HISTORY OF PRESENT ILLNESS:  The patient is a 71 y.o. female with metastatic renal cell carcinoma, proven per a right-sided thoracentesis in early December 2021.  She comes in today to go over recent CT imaging prior to her 17th cycle of maintenance nivolumab.  The patient states that she tolerated her 16th cycle well without having any significant difficulties.  Of note, this patient's maintenance nivolumab was preceded by 3 cycles of nivolumab/ipilimumab; a 4th cycle was not given due to her being hospitalized for severe colitis related to the ipilimumab component of her combination immunotherapy. She denies having any new problems which concern her for disease progression.   Unfortunately, she fell out of bed this morning while still asleep and is having pain in the right ribs.  She is using Tylenol as needed.   PHYSICAL EXAM:  Blood pressure (!) 162/73, pulse 91, temperature 98.4 F (36.9 C), resp. rate 14, height 5\' 1"  (1.549 m), weight 147 lb 6.4 oz (66.9 kg), SpO2 97 %. Wt Readings from Last 3 Encounters:  07/31/22 147 lb 6.4 oz (66.9 kg)  07/04/22 149 lb 1.9 oz (67.6 kg)  07/03/22 149 lb 4.8 oz (67.7 kg)   Body mass index is 27.85 kg/m.  Performance status (ECOG): 1 - Symptomatic but completely ambulatory  Physical Exam Vitals and nursing note reviewed.  Constitutional:      General: She is not in acute distress.    Appearance: Normal appearance.  HENT:     Head: Normocephalic and atraumatic.     Mouth/Throat:     Mouth: Mucous membranes are moist.     Pharynx: Oropharynx is clear. No oropharyngeal exudate or posterior oropharyngeal erythema.  Eyes:     General: No scleral icterus.    Extraocular Movements: Extraocular movements intact.     Conjunctiva/sclera: Conjunctivae normal.     Pupils: Pupils are  equal, round, and reactive to light.  Cardiovascular:     Rate and Rhythm: Normal rate and regular rhythm.     Heart sounds: Normal heart sounds. No murmur heard.    No friction rub. No gallop.  Pulmonary:     Effort: Pulmonary effort is normal.     Breath sounds: Normal breath sounds. No wheezing, rhonchi or rales.  Chest:     Chest wall: Tenderness (Lower right posteriolateral ribs) present. No lacerations, swelling or crepitus.  Abdominal:     General: There is no distension.     Palpations: Abdomen is soft. There is no hepatomegaly, splenomegaly or mass.     Tenderness: There is no abdominal tenderness.  Musculoskeletal:        General: Normal range of motion.     Cervical back: Normal range of motion and neck supple. No tenderness.     Right lower leg: No edema.     Left lower leg: No edema.  Lymphadenopathy:     Cervical: No cervical adenopathy.     Upper Body:     Right upper body: No supraclavicular or axillary adenopathy.     Left upper body: No supraclavicular or axillary adenopathy.     Lower Body: No right inguinal adenopathy. No left inguinal adenopathy.  Skin:    General: Skin is warm and dry.     Coloration: Skin is not jaundiced.     Findings: No ecchymosis or  rash.  Neurological:     Mental Status: She is alert and oriented to person, place, and time.     Cranial Nerves: No cranial nerve deficit.  Psychiatric:        Mood and Affect: Mood normal.        Behavior: Behavior normal.        Thought Content: Thought content normal.    LABS:      Latest Ref Rng & Units 07/30/2022   12:00 AM 07/03/2022   12:00 AM 06/05/2022   12:00 AM  CBC  WBC  7.1     6.8     6.9      Hemoglobin 12.0 - 16.0 15.0     13.7     13.9      Hematocrit 36 - 46 45     41     43      Platelets 150 - 400 K/uL 196     174     182         This result is from an external source.      Latest Ref Rng & Units 07/30/2022   12:00 AM 07/03/2022   12:00 AM 06/05/2022   12:00 AM  CMP  BUN 4  - 21 10     11     13       Creatinine 0.5 - 1.1 0.6     0.7     0.8      Sodium 137 - 147 138     140     139      Potassium 3.5 - 5.1 mEq/L 4.3     3.7     4.0      Chloride 99 - 108 103     105     104      CO2 13 - 22 26     28     23       Calcium 8.7 - 10.7 9.4     8.8     8.7      Alkaline Phos 25 - 125 83     93     93      AST 13 - 35 50     50     64      ALT 7 - 35 U/L 45     51     68         This result is from an external source.     No results found for: "CEA1", "CEA" / No results found for: "CEA1", "CEA" No results found for: "PSA1" No results found for: "TXM468" No results found for: "CAN125"  No results found for: "TOTALPROTELP", "ALBUMINELP", "A1GS", "A2GS", "BETS", "BETA2SER", "GAMS", "MSPIKE", "SPEI" No results found for: "TIBC", "FERRITIN", "IRONPCTSAT" Lab Results  Component Value Date   LDH 180 11/28/2020       Component Value Date/Time   LDH 180 11/28/2020 0955    Review Flowsheet       Latest Ref Rng & Units 11/28/2020  Oncology Labs  LDH 120 - 250 U/L 180      STUDIES:  No results found.    Exam(s): [id number] CT/CT CHEST-ABD-PELV W/IV CM  CLINICAL DATA: Restaging metastatic renal cell cancer.  EXAM:  CT CHEST, ABDOMEN, AND PELVIS WITH CONTRAST  TECHNIQUE:  Multidetector CT imaging of the chest, abdomen and pelvis was  performed following the standard protocol during bolus  administration of intravenous contrast.  RADIATION DOSE  REDUCTION: This exam was performed according to the  departmental dose-optimization program which includes automated  exposure control, adjustment of the mA and/or kV according to  patient size and/or use of iterative reconstruction technique.  CONTRAST: 100 cc Isovue 370  COMPARISON: 04/09/2022  FINDINGS:  CT CHEST FINDINGS  Cardiovascular: The heart is normal in size. No pericardial  effusion. The aorta is normal in caliber. No dissection. Stable  atherosclerotic calcification. Stable scattered coronary  artery  calcifications. The pulmonary arteries are unremarkable.  Mediastinum/Nodes: No mediastinal or hilar mass or lymphadenopathy.  The esophagus is grossly normal. The thyroid gland is unremarkable.  Lungs/Pleura: Stable surgical changes involving both lungs. There is  a persistent large right pleural effusion with overlying  atelectasis. Stable advanced emphysematous changes involving lungs.  No new pulmonary nodules to suggest metastatic disease.  Musculoskeletal: No significant bony findings.  CT ABDOMEN PELVIS FINDINGS  Hepatobiliary: Stable marked nodularity involving the right hepatic  lobe posteriorly at the dome. Stable peripheral area of  hyperattenuation involving segment 7 likely a vascular shunt. No  worrisome hepatic lesions or intrahepatic biliary dilatation. The  gallbladder is contracted. No common bile duct dilatation.  Pancreas: No mass, inflammation or ductal dilatation.  Spleen: Normal size. No focal lesions.  Adrenals/Urinary Tract: The adrenal glands and left kidney are  unremarkable. The right kidney demonstrates a persistent 4.1 x 3.4  cm enhancing mass consistent with known renal cell carcinoma. No  interval change. No involvement the right renal vein.  Stomach/Bowel: The stomach, duodenum, small bowel and colon are  unremarkable.  Vascular/Lymphatic: Advanced atherosclerotic calcification involving  the aorta and iliac arteries but no aneurysm. Few small scattered  mesenteric and retroperitoneal lymph nodes are stable. No mass or  overt adenopathy.  Reproductive: The uterus and ovaries are unremarkable and stable.  Other: No pelvic mass or adenopathy. No free pelvic fluid  collections. No inguinal mass or adenopathy. No abdominal wall  hernia or subcutaneous lesions.  Musculoskeletal: No significant bony findings.  IMPRESSION:  1. Stable 4.1 x 3.4 cm enhancing right renal mass consistent with  known renal cell carcinoma. No findings for abdominal/pelvic   metastatic disease.  2. Stable large right pleural effusion with overlying atelectasis.  3. Stable surgical changes involving both lungs. No findings  suspicious for pulmonary metastatic disease.  4. Stable advanced emphysematous changes involving the lungs.  5. Stable advanced atherosclerotic calcification involving the aorta  and iliac arteries.  Aortic Atherosclerosis (ICD10-I70.0) and Emphysema (ICD10-J43.9).   ASSESSMENT & PLAN:   Assessment/Plan:  71 y.o. female with metastatic renal cell carcinoma.  I reviewed the scan results with the patient today and her disease remains stable.  We will therefore continue maintenance nivolumab and she will proceed with her 17th cycle tomorrow.  She is having rib pain after a fall, so I will obtain plain films of the right ribs for further evaluation.  I will give her hydrocodone/APAP to use as needed for pain.  We will plan to see her back in 4 weeks for repeat clinical assessment prior to 18th cycle of maintenance pembrolizumab.  The patient understands all the plans discussed today and is in agreement with them.  She knows to contact our office if she develops concerns prior to her next appointment    Marvia Pickles, PA-C

## 2022-07-31 NOTE — Telephone Encounter (Signed)
Per 07/31/22 LOS next appt scheduled and confirmed with patient

## 2022-08-01 ENCOUNTER — Other Ambulatory Visit: Payer: Self-pay

## 2022-08-01 ENCOUNTER — Inpatient Hospital Stay: Payer: Medicare PPO

## 2022-08-01 VITALS — BP 149/67 | HR 67 | Resp 16 | Wt 147.0 lb

## 2022-08-01 DIAGNOSIS — J910 Malignant pleural effusion: Secondary | ICD-10-CM | POA: Diagnosis not present

## 2022-08-01 DIAGNOSIS — C641 Malignant neoplasm of right kidney, except renal pelvis: Secondary | ICD-10-CM | POA: Diagnosis not present

## 2022-08-01 DIAGNOSIS — D3001 Benign neoplasm of right kidney: Secondary | ICD-10-CM

## 2022-08-01 DIAGNOSIS — Z79899 Other long term (current) drug therapy: Secondary | ICD-10-CM | POA: Diagnosis not present

## 2022-08-01 DIAGNOSIS — Z5112 Encounter for antineoplastic immunotherapy: Secondary | ICD-10-CM | POA: Diagnosis not present

## 2022-08-01 MED ORDER — SODIUM CHLORIDE 0.9 % IV SOLN
480.0000 mg | Freq: Once | INTRAVENOUS | Status: AC
  Administered 2022-08-01: 480 mg via INTRAVENOUS
  Filled 2022-08-01: qty 48

## 2022-08-01 MED ORDER — SODIUM CHLORIDE 0.9 % IV SOLN: Freq: Once | INTRAVENOUS | Status: AC

## 2022-08-01 MED ORDER — HEPARIN SOD (PORK) LOCK FLUSH 100 UNIT/ML IV SOLN
500.0000 [IU] | Freq: Once | INTRAVENOUS | Status: AC | PRN
  Administered 2022-08-01: 500 [IU]

## 2022-08-01 MED ORDER — SODIUM CHLORIDE 0.9% FLUSH
10.0000 mL | INTRAVENOUS | Status: DC | PRN
  Administered 2022-08-01: 10 mL

## 2022-08-01 NOTE — Patient Instructions (Signed)
Nivolumab injection What is this medication? NIVOLUMAB (nye VOL ue mab) is a monoclonal antibody. It treats certain types of cancer. Some of the cancers treated are colon cancer, head and neck cancer, Hodgkin lymphoma, lung cancer, and melanoma. This medicine may be used for other purposes; ask your health care provider or pharmacist if you have questions. COMMON BRAND NAME(S): Opdivo What should I tell my care team before I take this medication? They need to know if you have any of these conditions: Autoimmune diseases such as Crohn's disease, ulcerative colitis, or lupus Have had or planning to have an allogeneic stem cell transplant (uses someone else's stem cells) History of chest radiation Organ transplant Nervous system problems such as myasthenia gravis or Guillain-Barre syndrome An unusual or allergic reaction to nivolumab, other medicines, foods, dyes, or preservatives Pregnant or trying to get pregnant Breast-feeding How should I use this medication? This medication is injected into a vein. It is given in a hospital or clinic setting. A special MedGuide will be given to you before each treatment. Be sure to read this information carefully each time. Talk to your care team regarding the use of this medication in children. While it may be prescribed for children as young as 12 years for selected conditions, precautions do apply. Overdosage: If you think you have taken too much of this medicine contact a poison control center or emergency room at once. NOTE: This medicine is only for you. Do not share this medicine with others. What if I miss a dose? Keep appointments for follow-up doses. It is important not to miss your dose. Call your care team if you are unable to keep an appointment. What may interact with this medication? Interactions have not been studied. This list may not describe all possible interactions. Give your health care provider a list of all the medicines, herbs,  non-prescription drugs, or dietary supplements you use. Also tell them if you smoke, drink alcohol, or use illegal drugs. Some items may interact with your medicine. What should I watch for while using this medication? Your condition will be monitored carefully while you are receiving this medication. You may need blood work done while you are taking this medication. Do not become pregnant while taking this medication or for 5 months after stopping it. Women should inform their care team if they wish to become pregnant or think they might be pregnant. There is a potential for serious harm to an unborn child. Talk to your care team for more information. Do not breast-feed an infant while taking this medication or for 5 months after stopping it. What side effects may I notice from receiving this medication? Side effects that you should report to your care team as soon as possible: Allergic reactions--skin rash, itching, hives, swelling of the face, lips, tongue, or throat Bloody or black, tar-like stools Change in vision Chest pain Diarrhea Dry cough, shortness of breath or trouble breathing Eye pain Fast or irregular heartbeat Fever, chills High blood sugar (hyperglycemia)--increased thirst or amount of urine, unusual weakness or fatigue, blurry vision High thyroid levels (hyperthyroidism)--fast or irregular heartbeat, weight loss, excessive sweating or sensitivity to heat, tremors or shaking, anxiety, nervousness, irregular menstrual cycle or spotting Kidney injury--decrease in the amount of urine, swelling of the ankles, hands, or feet Liver injury--right upper belly pain, loss of appetite, nausea, light-colored stool, dark yellow or brown urine, yellowing skin or eyes, unusual weakness or fatigue Low red blood cell count--unusual weakness or fatigue, dizziness, headache, trouble breathing  Low thyroid levels (hypothyroidism)--unusual weakness or fatigue, increased sensitivity to cold,  constipation, hair loss, dry skin, weight gain, feelings of depression Mood and behavior changes-confusion, change in sex drive or performance, irritability Muscle pain or cramps Pain, tingling, or numbness in the hands or feet, muscle weakness, trouble walking, loss of balance or coordination Red or dark brown urine Redness, blistering, peeling, or loosening of the skin, including inside the mouth Stomach pain Unusual bruising or bleeding Side effects that usually do not require medical attention (report to your care team if they continue or are bothersome): Bone pain Constipation Loss of appetite Nausea Tiredness Vomiting This list may not describe all possible side effects. Call your doctor for medical advice about side effects. You may report side effects to FDA at 1-800-FDA-1088. Where should I keep my medication? This medication is given in a hospital or clinic and will not be stored at home. NOTE: This sheet is a summary. It may not cover all possible information. If you have questions about this medicine, talk to your doctor, pharmacist, or health care provider.  2023 Elsevier/Gold Standard (2021-11-09 00:00:00)

## 2022-08-24 ENCOUNTER — Other Ambulatory Visit: Payer: Self-pay | Admitting: Pharmacist

## 2022-08-24 DIAGNOSIS — D3001 Benign neoplasm of right kidney: Secondary | ICD-10-CM

## 2022-08-27 NOTE — Progress Notes (Unsigned)
[facility]  [address] [place],  [place]  [address] [phone]  Clinic Day:  08/28/2022  Referring physician: Mateo Flow, MD   HISTORY OF PRESENT ILLNESS:  The patient is a 71 y.o. female with metastatic renal cell carcinoma, proven per a right-sided thoracentesis in early December 2021.  She comes in today to be evaluated before heading into her 18th cycle of maintenance nivolumab.  The patient states that she tolerated her 17th cycle well without having any significant difficulties.  Of note, this patient's maintenance nivolumab was preceded by 3 cycles of nivolumab/ipilimumab; a 4th cycle was not given due to her being hospitalized for severe colitis related to the ipilimumab component of her combination immunotherapy. She denies having any new problems which concern her for disease progression.      PHYSICAL EXAM:  Blood pressure 134/72, pulse 71, temperature 98.1 F (36.7 C), resp. rate 16, height 5\' 1"  (1.549 m), weight 147 lb 14.4 oz (67.1 kg), SpO2 96 %. Wt Readings from Last 3 Encounters:  08/28/22 147 lb 14.4 oz (67.1 kg)  08/01/22 147 lb (66.7 kg)  07/31/22 147 lb 6.4 oz (66.9 kg)   Body mass index is 27.95 kg/m.  Performance status (ECOG): 1 - Symptomatic but completely ambulatory  Physical Exam Vitals and nursing note reviewed.  Constitutional:      General: She is not in acute distress.    Appearance: Normal appearance.  HENT:     Head: Normocephalic and atraumatic.     Mouth/Throat:     Mouth: Mucous membranes are moist.     Pharynx: Oropharynx is clear. No oropharyngeal exudate or posterior oropharyngeal erythema.  Eyes:     General: No scleral icterus.    Extraocular Movements: Extraocular movements intact.     Conjunctiva/sclera: Conjunctivae normal.     Pupils: Pupils are equal, round, and reactive to light.  Cardiovascular:     Rate and Rhythm: Normal rate and regular rhythm.     Heart sounds: Normal heart  sounds. No murmur heard.    No friction rub. No gallop.  Pulmonary:     Effort: Pulmonary effort is normal.     Breath sounds: Normal breath sounds. No wheezing, rhonchi or rales.  Chest:     Chest wall: No lacerations, swelling, tenderness or crepitus.  Abdominal:     General: There is no distension.     Palpations: Abdomen is soft. There is no hepatomegaly, splenomegaly or mass.     Tenderness: There is no abdominal tenderness.  Musculoskeletal:        General: Normal range of motion.     Cervical back: Normal range of motion and neck supple. No tenderness.     Right lower leg: No edema.     Left lower leg: No edema.  Lymphadenopathy:     Cervical: No cervical adenopathy.     Upper Body:     Right upper body: No supraclavicular or axillary adenopathy.     Left upper body: No supraclavicular or axillary adenopathy.     Lower Body: No right inguinal adenopathy. No left inguinal adenopathy.  Skin:    General: Skin is warm and dry.     Coloration: Skin is not jaundiced.     Findings: No ecchymosis or rash.  Neurological:     Mental Status: She is alert and oriented to person, place, and time.     Cranial Nerves: No cranial nerve deficit.  Psychiatric:  Mood and Affect: Mood normal.        Behavior: Behavior normal.        Thought Content: Thought content normal.   LABS:      Latest Ref Rng & Units 08/28/2022   12:00 AM 07/30/2022   12:00 AM 07/03/2022   12:00 AM  CBC  WBC  6.8     7.1     6.8      Hemoglobin 12.0 - 16.0 14.3     15.0     13.7      Hematocrit 36 - 46 43     45     41      Platelets 150 - 400 K/uL 177     196     174         This result is from an external source.      Latest Ref Rng & Units 08/28/2022   12:00 AM 07/30/2022   12:00 AM 07/03/2022   12:00 AM  CMP  BUN 4 - 21 12     10     11       Creatinine 0.5 - 1.1 0.7     0.6     0.7      Sodium 137 - 147 141     138     140      Potassium 3.5 - 5.1 mEq/L 3.8     4.3     3.7      Chloride 99 - 108  102     103     105      CO2 13 - 22 28     26     28       Calcium 8.7 - 10.7 9.2     9.4     8.8      Alkaline Phos 25 - 125 91     83     93      AST 13 - 35 47     50     50      ALT 7 - 35 U/L 47     45     51         This result is from an external source.    ASSESSMENT & PLAN:  Assessment/Plan:  71 y.o. female with metastatic renal cell carcinoma.  The patient will proceed with her 18th cycle of maintenance nivolumab this week.  Clinically, she appears to be doing well.  I will see her back in 4 weeks to clinically reassess her before she heads into her 19th cycle of maintenance nivolumab.  The patient understands all the plans discussed today and is in agreement with them.      Evy Lutterman Macarthur Critchley, MD

## 2022-08-28 ENCOUNTER — Inpatient Hospital Stay: Payer: Medicare PPO | Attending: Oncology | Admitting: Oncology

## 2022-08-28 ENCOUNTER — Inpatient Hospital Stay: Payer: Medicare PPO

## 2022-08-28 VITALS — BP 134/72 | HR 71 | Temp 98.1°F | Resp 16 | Ht 61.0 in | Wt 147.9 lb

## 2022-08-28 DIAGNOSIS — D649 Anemia, unspecified: Secondary | ICD-10-CM | POA: Diagnosis not present

## 2022-08-28 DIAGNOSIS — D3001 Benign neoplasm of right kidney: Secondary | ICD-10-CM | POA: Diagnosis not present

## 2022-08-28 DIAGNOSIS — Z5112 Encounter for antineoplastic immunotherapy: Secondary | ICD-10-CM | POA: Diagnosis not present

## 2022-08-28 DIAGNOSIS — C641 Malignant neoplasm of right kidney, except renal pelvis: Secondary | ICD-10-CM | POA: Diagnosis not present

## 2022-08-28 DIAGNOSIS — Z79899 Other long term (current) drug therapy: Secondary | ICD-10-CM | POA: Diagnosis not present

## 2022-08-28 LAB — CBC: RBC: 4.27 (ref 3.87–5.11)

## 2022-08-28 LAB — HEPATIC FUNCTION PANEL
ALT: 47 U/L — AB (ref 7–35)
AST: 47 — AB (ref 13–35)
Alkaline Phosphatase: 91 (ref 25–125)
Bilirubin, Total: 0.6

## 2022-08-28 LAB — BASIC METABOLIC PANEL
BUN: 12 (ref 4–21)
CO2: 28 — AB (ref 13–22)
Chloride: 102 (ref 99–108)
Creatinine: 0.7 (ref 0.5–1.1)
Glucose: 111
Potassium: 3.8 mEq/L (ref 3.5–5.1)
Sodium: 141 (ref 137–147)

## 2022-08-28 LAB — TSH: TSH: 1.538 u[IU]/mL (ref 0.350–4.500)

## 2022-08-28 LAB — CBC AND DIFFERENTIAL
HCT: 43 (ref 36–46)
Hemoglobin: 14.3 (ref 12.0–16.0)
Neutrophils Absolute: 1.7
Platelets: 177 10*3/uL (ref 150–400)
WBC: 6.8

## 2022-08-28 LAB — COMPREHENSIVE METABOLIC PANEL
Albumin: 4.4 (ref 3.5–5.0)
Calcium: 9.2 (ref 8.7–10.7)

## 2022-08-28 MED FILL — Nivolumab IV Soln 100 MG/10ML: INTRAVENOUS | Qty: 48 | Status: AC

## 2022-08-29 ENCOUNTER — Inpatient Hospital Stay: Payer: Medicare PPO

## 2022-08-29 ENCOUNTER — Other Ambulatory Visit: Payer: Self-pay

## 2022-08-29 VITALS — BP 156/67 | HR 82 | Temp 98.3°F | Resp 18 | Ht 61.0 in | Wt 150.0 lb

## 2022-08-29 DIAGNOSIS — Z79899 Other long term (current) drug therapy: Secondary | ICD-10-CM | POA: Diagnosis not present

## 2022-08-29 DIAGNOSIS — C641 Malignant neoplasm of right kidney, except renal pelvis: Secondary | ICD-10-CM | POA: Diagnosis not present

## 2022-08-29 DIAGNOSIS — D3001 Benign neoplasm of right kidney: Secondary | ICD-10-CM

## 2022-08-29 DIAGNOSIS — Z5112 Encounter for antineoplastic immunotherapy: Secondary | ICD-10-CM | POA: Diagnosis not present

## 2022-08-29 MED ORDER — SODIUM CHLORIDE 0.9 % IV SOLN: Freq: Once | INTRAVENOUS | Status: AC

## 2022-08-29 MED ORDER — SODIUM CHLORIDE 0.9% FLUSH
10.0000 mL | INTRAVENOUS | Status: DC | PRN
  Administered 2022-08-29: 10 mL

## 2022-08-29 MED ORDER — SODIUM CHLORIDE 0.9 % IV SOLN
480.0000 mg | Freq: Once | INTRAVENOUS | Status: AC
  Administered 2022-08-29: 480 mg via INTRAVENOUS
  Filled 2022-08-29: qty 48

## 2022-08-29 MED ORDER — HEPARIN SOD (PORK) LOCK FLUSH 100 UNIT/ML IV SOLN
500.0000 [IU] | Freq: Once | INTRAVENOUS | Status: AC | PRN
  Administered 2022-08-29: 500 [IU]

## 2022-08-29 NOTE — Patient Instructions (Signed)
Nivolumab Injection What is this medication? NIVOLUMAB (nye VOL ue mab) treats some types of cancer. It works by helping your immune system slow or stop the spread of cancer cells. It is a monoclonal antibody. This medicine may be used for other purposes; ask your health care provider or pharmacist if you have questions. COMMON BRAND NAME(S): Opdivo What should I tell my care team before I take this medication? They need to know if you have any of these conditions: Allogeneic stem cell transplant (uses someone else's stem cells) Autoimmune diseases, such as Crohn disease, ulcerative colitis, lupus History of chest radiation Nervous system problems, such as Guillain-Barre syndrome or myasthenia gravis Organ transplant An unusual or allergic reaction to nivolumab, other medications, foods, dyes, or preservatives Pregnant or trying to get pregnant Breast-feeding How should I use this medication? This medication is infused into a vein. It is given in a hospital or clinic setting. A special MedGuide will be given to you before each treatment. Be sure to read this information carefully each time. Talk to your care team about the use of this medication in children. While it may be prescribed for children as young as 12 years for selected conditions, precautions do apply. Overdosage: If you think you have taken too much of this medicine contact a poison control center or emergency room at once. NOTE: This medicine is only for you. Do not share this medicine with others. What if I miss a dose? Keep appointments for follow-up doses. It is important not to miss your dose. Call your care team if you are unable to keep an appointment. What may interact with this medication? Interactions have not been studied. This list may not describe all possible interactions. Give your health care provider a list of all the medicines, herbs, non-prescription drugs, or dietary supplements you use. Also tell them if you  smoke, drink alcohol, or use illegal drugs. Some items may interact with your medicine. What should I watch for while using this medication? Your condition will be monitored carefully while you are receiving this medication. You may need blood work while taking this medication. This medication may cause serious skin reactions. They can happen weeks to months after starting the medication. Contact your care team right away if you notice fevers or flu-like symptoms with a rash. The rash may be red or purple and then turn into blisters or peeling of the skin. You may also notice a red rash with swelling of the face, lips, or lymph nodes in your neck or under your arms. Tell your care team right away if you have any change in your eyesight. Talk to your care team if you are pregnant or think you might be pregnant. A negative pregnancy test is required before starting this medication. A reliable form of contraception is recommended while taking this medication and for 5 months after the last dose. Talk to your care team about effective forms of contraception. Do not breast-feed while taking this medication and for 5 months after the last dose. What side effects may I notice from receiving this medication? Side effects that you should report to your care team as soon as possible: Allergic reactions--skin rash, itching, hives, swelling of the face, lips, tongue, or throat Dry cough, shortness of breath or trouble breathing Eye pain, redness, irritation, or discharge with blurry or decreased vision Heart muscle inflammation--unusual weakness or fatigue, shortness of breath, chest pain, fast or irregular heartbeat, dizziness, swelling of the ankles, feet, or hands Hormone  gland problems--headache, sensitivity to light, unusual weakness or fatigue, dizziness, fast or irregular heartbeat, increased sensitivity to cold or heat, excessive sweating, constipation, hair loss, increased thirst or amount of urine,  tremors or shaking, irritability Infusion reactions--chest pain, shortness of breath or trouble breathing, feeling faint or lightheaded Kidney injury (glomerulonephritis)--decrease in the amount of urine, red or dark brown urine, foamy or bubbly urine, swelling of the ankles, hands, or feet Liver injury--right upper belly pain, loss of appetite, nausea, light-colored stool, dark yellow or brown urine, yellowing skin or eyes, unusual weakness or fatigue Pain, tingling, or numbness in the hands or feet, muscle weakness, change in vision, confusion or trouble speaking, loss of balance or coordination, trouble walking, seizures Rash, fever, and swollen lymph nodes Redness, blistering, peeling, or loosening of the skin, including inside the mouth Sudden or severe stomach pain, bloody diarrhea, fever, nausea, vomiting Side effects that usually do not require medical attention (report these to your care team if they continue or are bothersome): Bone, joint, or muscle pain Diarrhea Fatigue Loss of appetite Nausea Skin rash This list may not describe all possible side effects. Call your doctor for medical advice about side effects. You may report side effects to FDA at 1-800-FDA-1088. Where should I keep my medication? This medication is given in a hospital or clinic. It will not be stored at home. NOTE: This sheet is a summary. It may not cover all possible information. If you have questions about this medicine, talk to your doctor, pharmacist, or health care provider.  2023 Elsevier/Gold Standard (2021-11-09 00:00:00)

## 2022-08-30 LAB — T4: T4, Total: 5 ug/dL (ref 4.5–12.0)

## 2022-09-02 ENCOUNTER — Encounter: Payer: Self-pay | Admitting: Oncology

## 2022-09-03 DIAGNOSIS — Z79899 Other long term (current) drug therapy: Secondary | ICD-10-CM | POA: Diagnosis not present

## 2022-09-03 DIAGNOSIS — R7309 Other abnormal glucose: Secondary | ICD-10-CM | POA: Diagnosis not present

## 2022-09-03 DIAGNOSIS — E7800 Pure hypercholesterolemia, unspecified: Secondary | ICD-10-CM | POA: Diagnosis not present

## 2022-09-25 ENCOUNTER — Inpatient Hospital Stay: Payer: Medicare PPO | Attending: Oncology | Admitting: Oncology

## 2022-09-25 ENCOUNTER — Inpatient Hospital Stay: Payer: Medicare PPO

## 2022-09-25 DIAGNOSIS — Z79899 Other long term (current) drug therapy: Secondary | ICD-10-CM | POA: Insufficient documentation

## 2022-09-25 DIAGNOSIS — D3001 Benign neoplasm of right kidney: Secondary | ICD-10-CM

## 2022-09-25 DIAGNOSIS — C641 Malignant neoplasm of right kidney, except renal pelvis: Secondary | ICD-10-CM | POA: Insufficient documentation

## 2022-09-25 DIAGNOSIS — Z5112 Encounter for antineoplastic immunotherapy: Secondary | ICD-10-CM | POA: Insufficient documentation

## 2022-09-25 LAB — BASIC METABOLIC PANEL
BUN: 10 (ref 4–21)
CO2: 28 — AB (ref 13–22)
Chloride: 105 (ref 99–108)
Creatinine: 0.7 (ref 0.5–1.1)
Glucose: 123
Potassium: 4 mEq/L (ref 3.5–5.1)
Sodium: 141 (ref 137–147)

## 2022-09-25 LAB — CBC AND DIFFERENTIAL
HCT: 43 (ref 36–46)
Hemoglobin: 14.6 (ref 12.0–16.0)
Neutrophils Absolute: 4.29
Platelets: 194 10*3/uL (ref 150–400)
WBC: 6.7

## 2022-09-25 LAB — TSH: TSH: 1.138 u[IU]/mL (ref 0.350–4.500)

## 2022-09-25 LAB — COMPREHENSIVE METABOLIC PANEL
Albumin: 4.6 (ref 3.5–5.0)
Calcium: 9.1 (ref 8.7–10.7)

## 2022-09-25 LAB — CBC: RBC: 4.35 (ref 3.87–5.11)

## 2022-09-25 LAB — HEPATIC FUNCTION PANEL
ALT: 43 U/L — AB (ref 7–35)
AST: 43 — AB (ref 13–35)
Alkaline Phosphatase: 97 (ref 25–125)
Bilirubin, Total: 0.5

## 2022-09-25 MED FILL — Nivolumab IV Soln 100 MG/10ML: INTRAVENOUS | Qty: 48 | Status: AC

## 2022-09-25 NOTE — Progress Notes (Signed)
[facility]  [address] [place],    [address] [phone]  Clinic Day:  09/25/2022  Referring physician: Mateo Flow, MD   HISTORY OF PRESENT ILLNESS:  The patient is a 71 y.o. female with metastatic renal cell carcinoma, proven per a right-sided thoracentesis in early December 2021.  She comes in today to be evaluated before heading into her 19th cycle of maintenance nivolumab. The patient states that she tolerated her 18th cycle well without having any significant difficulties.  Of note, this patient's maintenance nivolumab was preceded by 3 cycles of nivolumab/ipilimumab; a 4th cycle was not given due to her being hospitalized for severe colitis related to the ipilimumab component of her combination immunotherapy. She denies having any new problems which concern her for disease progression.      PHYSICAL EXAM:  Blood pressure (!) 160/71, pulse 78, temperature 98 F (36.7 C), resp. rate 14, height 5\' 1"  (1.549 m), weight 147 lb 11.2 oz (67 kg), SpO2 95 %. Wt Readings from Last 3 Encounters:  09/25/22 147 lb 11.2 oz (67 kg)  08/29/22 150 lb (68 kg)  08/28/22 147 lb 14.4 oz (67.1 kg)   Body mass index is 27.91 kg/m.  Performance status (ECOG): 1 - Symptomatic but completely ambulatory  Physical Exam Vitals and nursing note reviewed.  Constitutional:      General: She is not in acute distress.    Appearance: Normal appearance.  HENT:     Head: Normocephalic and atraumatic.     Mouth/Throat:     Mouth: Mucous membranes are moist.     Pharynx: Oropharynx is clear. No oropharyngeal exudate or posterior oropharyngeal erythema.  Eyes:     General: No scleral icterus.    Extraocular Movements: Extraocular movements intact.     Conjunctiva/sclera: Conjunctivae normal.     Pupils: Pupils are equal, round, and reactive to light.  Cardiovascular:     Rate and Rhythm: Normal rate and regular rhythm.     Heart sounds: Normal heart sounds.  No murmur heard.    No friction rub. No gallop.  Pulmonary:     Effort: Pulmonary effort is normal.     Breath sounds: Examination of the right-lower field reveals decreased breath sounds. Decreased breath sounds present. No wheezing, rhonchi or rales.  Chest:     Chest wall: No lacerations, swelling, tenderness or crepitus.  Abdominal:     General: There is no distension.     Palpations: Abdomen is soft. There is no hepatomegaly, splenomegaly or mass.     Tenderness: There is no abdominal tenderness.  Musculoskeletal:        General: Normal range of motion.     Cervical back: Normal range of motion and neck supple. No tenderness.     Right lower leg: No edema.     Left lower leg: No edema.  Lymphadenopathy:     Cervical: No cervical adenopathy.     Upper Body:     Right upper body: No supraclavicular or axillary adenopathy.     Left upper body: No supraclavicular or axillary adenopathy.     Lower Body: No right inguinal adenopathy. No left inguinal adenopathy.  Skin:    General: Skin is warm and dry.     Coloration: Skin is not jaundiced.     Findings: No ecchymosis or rash.  Neurological:     Mental Status: She is alert and oriented to person, place, and time.     Cranial Nerves:  No cranial nerve deficit.  Psychiatric:        Mood and Affect: Mood normal.        Behavior: Behavior normal.        Thought Content: Thought content normal.    LABS:     ASSESSMENT & PLAN:  Assessment/Plan:  71 y.o. female with metastatic renal cell carcinoma.  The patient will proceed with her 19th cycle of maintenance nivolumab this week.  Clinically, she appears to be doing well.  I will see her back in 4 weeks to clinically reassess her before she heads into her 20th cycle of maintenance nivolumab.  The patient understands all the plans discussed today and is in agreement with them.      Deonte Otting Macarthur Critchley, MD

## 2022-09-26 ENCOUNTER — Other Ambulatory Visit: Payer: Self-pay

## 2022-09-26 ENCOUNTER — Inpatient Hospital Stay: Payer: Medicare PPO

## 2022-09-26 VITALS — BP 182/77 | HR 77 | Temp 98.0°F | Resp 18 | Ht 61.0 in | Wt 148.0 lb

## 2022-09-26 DIAGNOSIS — Z5112 Encounter for antineoplastic immunotherapy: Secondary | ICD-10-CM | POA: Diagnosis not present

## 2022-09-26 DIAGNOSIS — D3001 Benign neoplasm of right kidney: Secondary | ICD-10-CM

## 2022-09-26 DIAGNOSIS — Z79899 Other long term (current) drug therapy: Secondary | ICD-10-CM | POA: Diagnosis not present

## 2022-09-26 DIAGNOSIS — C641 Malignant neoplasm of right kidney, except renal pelvis: Secondary | ICD-10-CM | POA: Diagnosis not present

## 2022-09-26 MED ORDER — SODIUM CHLORIDE 0.9% FLUSH
10.0000 mL | INTRAVENOUS | Status: DC | PRN
  Administered 2022-09-26: 10 mL

## 2022-09-26 MED ORDER — HEPARIN SOD (PORK) LOCK FLUSH 100 UNIT/ML IV SOLN
500.0000 [IU] | Freq: Once | INTRAVENOUS | Status: AC | PRN
  Administered 2022-09-26: 500 [IU]

## 2022-09-26 MED ORDER — SODIUM CHLORIDE 0.9 % IV SOLN
480.0000 mg | Freq: Once | INTRAVENOUS | Status: AC
  Administered 2022-09-26: 480 mg via INTRAVENOUS
  Filled 2022-09-26: qty 48

## 2022-09-26 MED ORDER — SODIUM CHLORIDE 0.9 % IV SOLN: Freq: Once | INTRAVENOUS | Status: DC

## 2022-09-26 NOTE — Patient Instructions (Signed)
Nivolumab Injection What is this medication? NIVOLUMAB (nye VOL ue mab) treats some types of cancer. It works by helping your immune system slow or stop the spread of cancer cells. It is a monoclonal antibody. This medicine may be used for other purposes; ask your health care provider or pharmacist if you have questions. COMMON BRAND NAME(S): Opdivo What should I tell my care team before I take this medication? They need to know if you have any of these conditions: Allogeneic stem cell transplant (uses someone else's stem cells) Autoimmune diseases, such as Crohn disease, ulcerative colitis, lupus History of chest radiation Nervous system problems, such as Guillain-Barre syndrome or myasthenia gravis Organ transplant An unusual or allergic reaction to nivolumab, other medications, foods, dyes, or preservatives Pregnant or trying to get pregnant Breast-feeding How should I use this medication? This medication is infused into a vein. It is given in a hospital or clinic setting. A special MedGuide will be given to you before each treatment. Be sure to read this information carefully each time. Talk to your care team about the use of this medication in children. While it may be prescribed for children as young as 12 years for selected conditions, precautions do apply. Overdosage: If you think you have taken too much of this medicine contact a poison control center or emergency room at once. NOTE: This medicine is only for you. Do not share this medicine with others. What if I miss a dose? Keep appointments for follow-up doses. It is important not to miss your dose. Call your care team if you are unable to keep an appointment. What may interact with this medication? Interactions have not been studied. This list may not describe all possible interactions. Give your health care provider a list of all the medicines, herbs, non-prescription drugs, or dietary supplements you use. Also tell them if you  smoke, drink alcohol, or use illegal drugs. Some items may interact with your medicine. What should I watch for while using this medication? Your condition will be monitored carefully while you are receiving this medication. You may need blood work while taking this medication. This medication may cause serious skin reactions. They can happen weeks to months after starting the medication. Contact your care team right away if you notice fevers or flu-like symptoms with a rash. The rash may be red or purple and then turn into blisters or peeling of the skin. You may also notice a red rash with swelling of the face, lips, or lymph nodes in your neck or under your arms. Tell your care team right away if you have any change in your eyesight. Talk to your care team if you are pregnant or think you might be pregnant. A negative pregnancy test is required before starting this medication. A reliable form of contraception is recommended while taking this medication and for 5 months after the last dose. Talk to your care team about effective forms of contraception. Do not breast-feed while taking this medication and for 5 months after the last dose. What side effects may I notice from receiving this medication? Side effects that you should report to your care team as soon as possible: Allergic reactions--skin rash, itching, hives, swelling of the face, lips, tongue, or throat Dry cough, shortness of breath or trouble breathing Eye pain, redness, irritation, or discharge with blurry or decreased vision Heart muscle inflammation--unusual weakness or fatigue, shortness of breath, chest pain, fast or irregular heartbeat, dizziness, swelling of the ankles, feet, or hands Hormone  gland problems--headache, sensitivity to light, unusual weakness or fatigue, dizziness, fast or irregular heartbeat, increased sensitivity to cold or heat, excessive sweating, constipation, hair loss, increased thirst or amount of urine,  tremors or shaking, irritability Infusion reactions--chest pain, shortness of breath or trouble breathing, feeling faint or lightheaded Kidney injury (glomerulonephritis)--decrease in the amount of urine, red or dark brown urine, foamy or bubbly urine, swelling of the ankles, hands, or feet Liver injury--right upper belly pain, loss of appetite, nausea, light-colored stool, dark yellow or brown urine, yellowing skin or eyes, unusual weakness or fatigue Pain, tingling, or numbness in the hands or feet, muscle weakness, change in vision, confusion or trouble speaking, loss of balance or coordination, trouble walking, seizures Rash, fever, and swollen lymph nodes Redness, blistering, peeling, or loosening of the skin, including inside the mouth Sudden or severe stomach pain, bloody diarrhea, fever, nausea, vomiting Side effects that usually do not require medical attention (report these to your care team if they continue or are bothersome): Bone, joint, or muscle pain Diarrhea Fatigue Loss of appetite Nausea Skin rash This list may not describe all possible side effects. Call your doctor for medical advice about side effects. You may report side effects to FDA at 1-800-FDA-1088. Where should I keep my medication? This medication is given in a hospital or clinic. It will not be stored at home. NOTE: This sheet is a summary. It may not cover all possible information. If you have questions about this medicine, talk to your doctor, pharmacist, or health care provider.  2023 Elsevier/Gold Standard (2021-11-09 00:00:00)

## 2022-09-27 LAB — T4: T4, Total: 5.7 ug/dL (ref 4.5–12.0)

## 2022-09-29 DIAGNOSIS — Z20822 Contact with and (suspected) exposure to covid-19: Secondary | ICD-10-CM | POA: Diagnosis not present

## 2022-09-29 DIAGNOSIS — J988 Other specified respiratory disorders: Secondary | ICD-10-CM | POA: Diagnosis not present

## 2022-10-22 NOTE — Progress Notes (Signed)
[facility]  [address] [place],  [place]  [address] [phone]  Clinic Day:  10/23/2022  Referring physician: Mateo Flow, MD   HISTORY OF PRESENT ILLNESS:  The patient is a 71 y.o. female with metastatic renal cell carcinoma, proven per a right-sided thoracentesis in early December 2021.  She comes in today to be evaluated before heading into her 20th cycle of maintenance nivolumab. The patient states that she tolerated her 19th cycle well without having any significant difficulties.  Of note, this patient's maintenance nivolumab was preceded by 3 cycles of nivolumab/ipilimumab; a 4th cycle was not given due to her being hospitalized for severe colitis related to the ipilimumab component of her combination immunotherapy.  She denies having any new problems which concern her for disease progression.      PHYSICAL EXAM:  Blood pressure (!) 181/78, pulse 71, temperature 98.6 F (37 C), resp. rate 16, height 5\' 1"  (1.549 m), weight 146 lb 1.6 oz (66.3 kg), SpO2 96 %. Wt Readings from Last 3 Encounters:  10/24/22 150 lb 6.4 oz (68.2 kg)  10/23/22 146 lb 1.6 oz (66.3 kg)  09/26/22 148 lb (67.1 kg)   Body mass index is 27.61 kg/m.  Performance status (ECOG): 1 - Symptomatic but completely ambulatory  Physical Exam Vitals and nursing note reviewed.  Constitutional:      General: She is not in acute distress.    Appearance: Normal appearance.  HENT:     Head: Normocephalic and atraumatic.     Mouth/Throat:     Mouth: Mucous membranes are moist.     Pharynx: Oropharynx is clear. No oropharyngeal exudate or posterior oropharyngeal erythema.  Eyes:     General: No scleral icterus.    Extraocular Movements: Extraocular movements intact.     Conjunctiva/sclera: Conjunctivae normal.     Pupils: Pupils are equal, round, and reactive to light.  Cardiovascular:     Rate and Rhythm: Normal rate and regular rhythm.     Heart sounds: Normal heart  sounds. No murmur heard.    No friction rub. No gallop.  Pulmonary:     Effort: Pulmonary effort is normal.     Breath sounds: Examination of the right-lower field reveals decreased breath sounds. Decreased breath sounds present. No wheezing, rhonchi or rales.  Chest:     Chest wall: No lacerations, swelling, tenderness or crepitus.  Abdominal:     General: There is no distension.     Palpations: Abdomen is soft. There is no hepatomegaly, splenomegaly or mass.     Tenderness: There is no abdominal tenderness.  Musculoskeletal:        General: Normal range of motion.     Cervical back: Normal range of motion and neck supple. No tenderness.     Right lower leg: No edema.     Left lower leg: No edema.  Lymphadenopathy:     Cervical: No cervical adenopathy.     Upper Body:     Right upper body: No supraclavicular or axillary adenopathy.     Left upper body: No supraclavicular or axillary adenopathy.     Lower Body: No right inguinal adenopathy. No left inguinal adenopathy.  Skin:    General: Skin is warm and dry.     Coloration: Skin is not jaundiced.     Findings: No ecchymosis or rash.  Neurological:     Mental Status: She is alert and oriented to person, place, and time.     Cranial  Nerves: No cranial nerve deficit.  Psychiatric:        Mood and Affect: Mood normal.        Behavior: Behavior normal.        Thought Content: Thought content normal.    LABS:    Latest Reference Range & Units 10/23/22 00:00  Sodium 137 - 147  141 (E)  Potassium 3.5 - 5.1 mEq/L 3.6 (E)  Chloride 99 - 108  106 (E)  CO2 13 - 22  26 ! (E)  Glucose  122 (E)  BUN 4 - 21  11 (E)  Creatinine 0.5 - 1.1  0.7 (E)  Calcium 8.7 - 10.7  9.0 (E)  Alkaline Phosphatase 25 - 125  80 (E)  Albumin 3.5 - 5.0  4.5 (E)  AST 13 - 35  49 ! (E)  ALT 7 - 35 U/L 39 ! (E)  Bilirubin, Total  0.7 (E)  WBC  7.0 (E)  RBC 3.87 - 5.11  4.24 (E)  Hemoglobin 12.0 - 16.0  14.2 (E)  HCT 36 - 46  42 (E)  Platelets 150 -  400 K/uL 192 (E)  NEUT#  4.20 (E)  !: Data is abnormal (E): External lab result  ASSESSMENT & PLAN:  Assessment/Plan:  71 y.o. female with metastatic renal cell carcinoma.  The patient will proceed with her 20th cycle of maintenance nivolumab this week.  Clinically, she appears to be doing well.  I will see her back in 4 weeks before she heads into her 21st cycle of maintenance nivolumab.  CT scans of her chest/abdomen/pelvis will be done a day before her next visit to ascertain her new disease baseline after 20 cycles of maintenance nivolumab immunotherapy.  The patient understands all the plans discussed today and is in agreement with them.     Bryndon Cumbie Macarthur Critchley, MD

## 2022-10-23 ENCOUNTER — Inpatient Hospital Stay: Payer: Medicare PPO

## 2022-10-23 ENCOUNTER — Inpatient Hospital Stay: Payer: Medicare PPO | Attending: Oncology | Admitting: Oncology

## 2022-10-23 DIAGNOSIS — C641 Malignant neoplasm of right kidney, except renal pelvis: Secondary | ICD-10-CM | POA: Diagnosis not present

## 2022-10-23 DIAGNOSIS — D3001 Benign neoplasm of right kidney: Secondary | ICD-10-CM

## 2022-10-23 DIAGNOSIS — D649 Anemia, unspecified: Secondary | ICD-10-CM | POA: Diagnosis not present

## 2022-10-23 DIAGNOSIS — Z5112 Encounter for antineoplastic immunotherapy: Secondary | ICD-10-CM | POA: Insufficient documentation

## 2022-10-23 DIAGNOSIS — Z79899 Other long term (current) drug therapy: Secondary | ICD-10-CM | POA: Insufficient documentation

## 2022-10-23 LAB — BASIC METABOLIC PANEL
BUN: 11 (ref 4–21)
CO2: 26 — AB (ref 13–22)
Chloride: 106 (ref 99–108)
Creatinine: 0.7 (ref 0.5–1.1)
Glucose: 122
Potassium: 3.6 mEq/L (ref 3.5–5.1)
Sodium: 141 (ref 137–147)

## 2022-10-23 LAB — COMPREHENSIVE METABOLIC PANEL
Albumin: 4.5 (ref 3.5–5.0)
Calcium: 9 (ref 8.7–10.7)

## 2022-10-23 LAB — CBC: RBC: 4.24 (ref 3.87–5.11)

## 2022-10-23 LAB — CBC AND DIFFERENTIAL
HCT: 42 (ref 36–46)
Hemoglobin: 14.2 (ref 12.0–16.0)
Neutrophils Absolute: 4.2
Platelets: 192 10*3/uL (ref 150–400)
WBC: 7

## 2022-10-23 LAB — HEPATIC FUNCTION PANEL
ALT: 39 U/L — AB (ref 7–35)
AST: 49 — AB (ref 13–35)
Alkaline Phosphatase: 80 (ref 25–125)
Bilirubin, Total: 0.7

## 2022-10-23 LAB — TSH: TSH: 1.094 u[IU]/mL (ref 0.350–4.500)

## 2022-10-23 MED FILL — Nivolumab IV Soln 100 MG/10ML: INTRAVENOUS | Qty: 48 | Status: AC

## 2022-10-24 ENCOUNTER — Inpatient Hospital Stay: Payer: Medicare PPO

## 2022-10-24 VITALS — BP 152/90 | HR 118 | Temp 98.2°F | Resp 16 | Wt 150.4 lb

## 2022-10-24 DIAGNOSIS — D3001 Benign neoplasm of right kidney: Secondary | ICD-10-CM

## 2022-10-24 DIAGNOSIS — Z5112 Encounter for antineoplastic immunotherapy: Secondary | ICD-10-CM | POA: Diagnosis not present

## 2022-10-24 DIAGNOSIS — C641 Malignant neoplasm of right kidney, except renal pelvis: Secondary | ICD-10-CM | POA: Diagnosis not present

## 2022-10-24 DIAGNOSIS — Z79899 Other long term (current) drug therapy: Secondary | ICD-10-CM | POA: Diagnosis not present

## 2022-10-24 MED ORDER — SODIUM CHLORIDE 0.9 % IV SOLN: Freq: Once | INTRAVENOUS | Status: AC

## 2022-10-24 MED ORDER — HEPARIN SOD (PORK) LOCK FLUSH 100 UNIT/ML IV SOLN
500.0000 [IU] | Freq: Once | INTRAVENOUS | Status: AC | PRN
  Administered 2022-10-24: 500 [IU]

## 2022-10-24 MED ORDER — SODIUM CHLORIDE 0.9% FLUSH
10.0000 mL | INTRAVENOUS | Status: DC | PRN
  Administered 2022-10-24: 10 mL

## 2022-10-24 MED ORDER — SODIUM CHLORIDE 0.9 % IV SOLN
480.0000 mg | Freq: Once | INTRAVENOUS | Status: AC
  Administered 2022-10-24: 480 mg via INTRAVENOUS
  Filled 2022-10-24: qty 48

## 2022-10-24 NOTE — Patient Instructions (Signed)
Nivolumab Injection What is this medication? NIVOLUMAB (nye VOL ue mab) treats some types of cancer. It works by helping your immune system slow or stop the spread of cancer cells. It is a monoclonal antibody. This medicine may be used for other purposes; ask your health care provider or pharmacist if you have questions. COMMON BRAND NAME(S): Opdivo What should I tell my care team before I take this medication? They need to know if you have any of these conditions: Allogeneic stem cell transplant (uses someone else's stem cells) Autoimmune diseases, such as Crohn disease, ulcerative colitis, lupus History of chest radiation Nervous system problems, such as Guillain-Barre syndrome or myasthenia gravis Organ transplant An unusual or allergic reaction to nivolumab, other medications, foods, dyes, or preservatives Pregnant or trying to get pregnant Breast-feeding How should I use this medication? This medication is infused into a vein. It is given in a hospital or clinic setting. A special MedGuide will be given to you before each treatment. Be sure to read this information carefully each time. Talk to your care team about the use of this medication in children. While it may be prescribed for children as young as 12 years for selected conditions, precautions do apply. Overdosage: If you think you have taken too much of this medicine contact a poison control center or emergency room at once. NOTE: This medicine is only for you. Do not share this medicine with others. What if I miss a dose? Keep appointments for follow-up doses. It is important not to miss your dose. Call your care team if you are unable to keep an appointment. What may interact with this medication? Interactions have not been studied. This list may not describe all possible interactions. Give your health care provider a list of all the medicines, herbs, non-prescription drugs, or dietary supplements you use. Also tell them if you  smoke, drink alcohol, or use illegal drugs. Some items may interact with your medicine. What should I watch for while using this medication? Your condition will be monitored carefully while you are receiving this medication. You may need blood work while taking this medication. This medication may cause serious skin reactions. They can happen weeks to months after starting the medication. Contact your care team right away if you notice fevers or flu-like symptoms with a rash. The rash may be red or purple and then turn into blisters or peeling of the skin. You may also notice a red rash with swelling of the face, lips, or lymph nodes in your neck or under your arms. Tell your care team right away if you have any change in your eyesight. Talk to your care team if you are pregnant or think you might be pregnant. A negative pregnancy test is required before starting this medication. A reliable form of contraception is recommended while taking this medication and for 5 months after the last dose. Talk to your care team about effective forms of contraception. Do not breast-feed while taking this medication and for 5 months after the last dose. What side effects may I notice from receiving this medication? Side effects that you should report to your care team as soon as possible: Allergic reactions--skin rash, itching, hives, swelling of the face, lips, tongue, or throat Dry cough, shortness of breath or trouble breathing Eye pain, redness, irritation, or discharge with blurry or decreased vision Heart muscle inflammation--unusual weakness or fatigue, shortness of breath, chest pain, fast or irregular heartbeat, dizziness, swelling of the ankles, feet, or hands Hormone  gland problems--headache, sensitivity to light, unusual weakness or fatigue, dizziness, fast or irregular heartbeat, increased sensitivity to cold or heat, excessive sweating, constipation, hair loss, increased thirst or amount of urine,  tremors or shaking, irritability Infusion reactions--chest pain, shortness of breath or trouble breathing, feeling faint or lightheaded Kidney injury (glomerulonephritis)--decrease in the amount of urine, red or dark brown urine, foamy or bubbly urine, swelling of the ankles, hands, or feet Liver injury--right upper belly pain, loss of appetite, nausea, light-colored stool, dark yellow or brown urine, yellowing skin or eyes, unusual weakness or fatigue Pain, tingling, or numbness in the hands or feet, muscle weakness, change in vision, confusion or trouble speaking, loss of balance or coordination, trouble walking, seizures Rash, fever, and swollen lymph nodes Redness, blistering, peeling, or loosening of the skin, including inside the mouth Sudden or severe stomach pain, bloody diarrhea, fever, nausea, vomiting Side effects that usually do not require medical attention (report these to your care team if they continue or are bothersome): Bone, joint, or muscle pain Diarrhea Fatigue Loss of appetite Nausea Skin rash This list may not describe all possible side effects. Call your doctor for medical advice about side effects. You may report side effects to FDA at 1-800-FDA-1088. Where should I keep my medication? This medication is given in a hospital or clinic. It will not be stored at home. NOTE: This sheet is a summary. It may not cover all possible information. If you have questions about this medicine, talk to your doctor, pharmacist, or health care provider.  2023 Elsevier/Gold Standard (2022-04-08 00:00:00)

## 2022-10-25 LAB — T4: T4, Total: 6.1 ug/dL (ref 4.5–12.0)

## 2022-10-28 DIAGNOSIS — Z23 Encounter for immunization: Secondary | ICD-10-CM | POA: Diagnosis not present

## 2022-10-30 ENCOUNTER — Encounter: Payer: Self-pay | Admitting: Oncology

## 2022-11-19 DIAGNOSIS — C7800 Secondary malignant neoplasm of unspecified lung: Secondary | ICD-10-CM | POA: Diagnosis not present

## 2022-11-19 DIAGNOSIS — K8689 Other specified diseases of pancreas: Secondary | ICD-10-CM | POA: Diagnosis not present

## 2022-11-19 DIAGNOSIS — J439 Emphysema, unspecified: Secondary | ICD-10-CM | POA: Diagnosis not present

## 2022-11-19 DIAGNOSIS — N2889 Other specified disorders of kidney and ureter: Secondary | ICD-10-CM | POA: Diagnosis not present

## 2022-11-19 DIAGNOSIS — D3001 Benign neoplasm of right kidney: Secondary | ICD-10-CM | POA: Diagnosis not present

## 2022-11-19 DIAGNOSIS — D649 Anemia, unspecified: Secondary | ICD-10-CM | POA: Diagnosis not present

## 2022-11-19 DIAGNOSIS — J9811 Atelectasis: Secondary | ICD-10-CM | POA: Diagnosis not present

## 2022-11-19 DIAGNOSIS — K5730 Diverticulosis of large intestine without perforation or abscess without bleeding: Secondary | ICD-10-CM | POA: Diagnosis not present

## 2022-11-19 DIAGNOSIS — C641 Malignant neoplasm of right kidney, except renal pelvis: Secondary | ICD-10-CM | POA: Diagnosis not present

## 2022-11-19 DIAGNOSIS — J90 Pleural effusion, not elsewhere classified: Secondary | ICD-10-CM | POA: Diagnosis not present

## 2022-11-19 LAB — BASIC METABOLIC PANEL
BUN: 12 (ref 4–21)
CO2: 27 — AB (ref 13–22)
Chloride: 106 (ref 99–108)
Creatinine: 0.7 (ref 0.5–1.1)
Glucose: 109
Potassium: 3.9 mEq/L (ref 3.5–5.1)
Sodium: 139 (ref 137–147)

## 2022-11-19 LAB — HEPATIC FUNCTION PANEL
ALT: 43 U/L — AB (ref 7–35)
AST: 38 — AB (ref 13–35)
Alkaline Phosphatase: 87 (ref 25–125)
Bilirubin, Total: 0.6

## 2022-11-19 LAB — CBC AND DIFFERENTIAL
HCT: 42 (ref 36–46)
Hemoglobin: 14.4 (ref 12.0–16.0)
Neutrophils Absolute: 3.97
Platelets: 175 10*3/uL (ref 150–400)
WBC: 6.3

## 2022-11-19 LAB — CBC: RBC: 4.27 (ref 3.87–5.11)

## 2022-11-19 LAB — COMPREHENSIVE METABOLIC PANEL
Albumin: 4.4 (ref 3.5–5.0)
Calcium: 9.1 (ref 8.7–10.7)

## 2022-11-19 NOTE — Progress Notes (Signed)
[facility]  [address] [place],  [place]  [address] [phone]  Clinic Day:  11/20/2022  Referring physician: Mateo Flow, MD   HISTORY OF PRESENT ILLNESS:  The patient is a 71 y.o. female with metastatic renal cell carcinoma, proven per a right-sided thoracentesis in early December 2021.  She comes in today to go over her CT scans to ascertain her new disease baseline after receiving 20 cycles of maintenance nivolumab. The patient tolerated her 20th cycle well without having any significant difficulties.  Of note, this patient's maintenance nivolumab was preceded by 3 cycles of nivolumab/ipilimumab; a 4th cycle was not given due to her being hospitalized for severe colitis related to the ipilimumab component of her combination immunotherapy.  She denies having any new problems which concern her for disease progression.      PHYSICAL EXAM:  Blood pressure (!) 163/105, pulse 73, temperature 97.9 F (36.6 C), resp. rate 16, height 5\' 1"  (1.549 m), weight 148 lb 1.6 oz (67.2 kg), SpO2 95 %. Wt Readings from Last 3 Encounters:  11/20/22 148 lb 1.6 oz (67.2 kg)  10/24/22 150 lb 6.4 oz (68.2 kg)  10/23/22 146 lb 1.6 oz (66.3 kg)   Body mass index is 27.98 kg/m.  Performance status (ECOG): 1 - Symptomatic but completely ambulatory  Physical Exam Vitals and nursing note reviewed.  Constitutional:      General: She is not in acute distress.    Appearance: Normal appearance.  HENT:     Head: Normocephalic and atraumatic.     Mouth/Throat:     Mouth: Mucous membranes are moist.     Pharynx: Oropharynx is clear. No oropharyngeal exudate or posterior oropharyngeal erythema.  Eyes:     General: No scleral icterus.    Extraocular Movements: Extraocular movements intact.     Conjunctiva/sclera: Conjunctivae normal.     Pupils: Pupils are equal, round, and reactive to light.  Cardiovascular:     Rate and Rhythm: Normal rate and regular rhythm.      Heart sounds: Normal heart sounds. No murmur heard.    No friction rub. No gallop.  Pulmonary:     Effort: Pulmonary effort is normal.     Breath sounds: Examination of the right-lower field reveals decreased breath sounds. Decreased breath sounds present. No wheezing, rhonchi or rales.  Chest:     Chest wall: No lacerations, swelling, tenderness or crepitus.  Abdominal:     General: There is no distension.     Palpations: Abdomen is soft. There is no hepatomegaly, splenomegaly or mass.     Tenderness: There is no abdominal tenderness.  Musculoskeletal:        General: Normal range of motion.     Cervical back: Normal range of motion and neck supple. No tenderness.     Right lower leg: No edema.     Left lower leg: No edema.  Lymphadenopathy:     Cervical: No cervical adenopathy.     Upper Body:     Right upper body: No supraclavicular or axillary adenopathy.     Left upper body: No supraclavicular or axillary adenopathy.     Lower Body: No right inguinal adenopathy. No left inguinal adenopathy.  Skin:    General: Skin is warm and dry.     Coloration: Skin is not jaundiced.     Findings: No ecchymosis or rash.  Neurological:     Mental Status: She is alert and oriented to person, place, and  time.     Cranial Nerves: No cranial nerve deficit.  Psychiatric:        Mood and Affect: Mood normal.        Behavior: Behavior normal.        Thought Content: Thought content normal.   SCANS:  CT scans of his chest/abdomen/pelvis revealed the following: FINDINGS: CT CHEST FINDINGS  Cardiovascular: Right chest wall Port-A-Cath tip in the low SVC. Coronary artery and aortic athero sclerotic calcification. Normal heart size. No pericardial effusion.  Mediastinum/Nodes: No thoracic adenopathy by size. Unremarkable thyroid and esophagus. Stable partially calcified nodule in the left superior mediastinum (series 2/image 13) this appears stable to decreased in size since CT  09/21/2020.  Lungs/Pleura: No significant change from 07/30/2022. Large right pleural effusion. Stable postsurgical changes involving both lungs. Advanced emphysema both lungs. Scattered atelectasis/scarring.  Musculoskeletal: Heterogenous marrow likely related to demineralization. No acute abnormality or destructive bone lesion.  CT ABDOMEN PELVIS FINDINGS  Hepatobiliary: Stable exophytic nodularity of the liver dome. Evaluation for metastases is limited by lack of IV contrast. No biliary dilation. No evidence of cholecystitis.  Pancreas: Fatty atrophy of the pancreas. No acute abnormality or mass.  Spleen: Unremarkable.  Adrenals/Urinary Tract: Normal adrenal glands. Normal left kidney. No urinary calculi or hydronephrosis.  Redemonstrated mass in the lateral lower pole of the right kidney measuring a proximally 3.7 x 3.1 cm in size, unchanged using similar measuring technique.  Stomach/Bowel: Colonic diverticulosis without diverticulitis. Normal caliber large and small bowel. No bowel wall thickening. Unremarkable stomach.  Vascular/Lymphatic: Aortic atherosclerosis. No new or enlarging abdominal or pelvic lymph nodes.  Reproductive: Hysterectomy.  Other: No free intraperitoneal fluid or air  Musculoskeletal: No acute osseous abnormality or destructive osseous lesion. Grade 1 anterolisthesis L4. Advanced thoracolumbar spondylosis.  IMPRESSION: Stable exam.  Unchanged right renal mass consistent with renal cell carcinoma.  Stable exophytic nodularity of the right hepatic lobe.  Similar large right pleural effusion.  LABS:     ASSESSMENT & PLAN:  Assessment/Plan:  71 y.o. female with metastatic renal cell carcinoma.  In clinic today, went over all of her CT scan images with her, for which she could see that all of her metastatic disease remains stable.  There remains no signs of disease progression.  Understandably, the patient was pleased with her CT scan  results.  Based upon this, the patient will proceed with her 21st cycle of maintenance nivolumab this week.  Clinically, she appears to be doing well.  I will see her back in 4 weeks before she heads into her 22nd cycle of maintenance nivolumab.  The patient understands all the plans discussed today and is in agreement with them.     Nasteho Glantz Macarthur Critchley, MD

## 2022-11-20 ENCOUNTER — Telehealth: Payer: Self-pay | Admitting: Oncology

## 2022-11-20 ENCOUNTER — Inpatient Hospital Stay (INDEPENDENT_AMBULATORY_CARE_PROVIDER_SITE_OTHER): Payer: Medicare PPO | Admitting: Oncology

## 2022-11-20 ENCOUNTER — Other Ambulatory Visit: Payer: Medicare PPO

## 2022-11-20 DIAGNOSIS — D3001 Benign neoplasm of right kidney: Secondary | ICD-10-CM

## 2022-11-20 MED FILL — Nivolumab IV Soln 100 MG/10ML: INTRAVENOUS | Qty: 48 | Status: AC

## 2022-11-20 NOTE — Telephone Encounter (Signed)
11/20/22 Next appt scheduled and confirmed with patient

## 2022-11-21 ENCOUNTER — Inpatient Hospital Stay: Payer: Medicare PPO

## 2022-11-21 ENCOUNTER — Other Ambulatory Visit: Payer: Self-pay

## 2022-11-21 VITALS — BP 167/64 | HR 71 | Temp 97.8°F | Resp 18 | Ht 61.0 in | Wt 148.8 lb

## 2022-11-21 DIAGNOSIS — Z79899 Other long term (current) drug therapy: Secondary | ICD-10-CM | POA: Diagnosis not present

## 2022-11-21 DIAGNOSIS — D3001 Benign neoplasm of right kidney: Secondary | ICD-10-CM

## 2022-11-21 DIAGNOSIS — J3089 Other allergic rhinitis: Secondary | ICD-10-CM | POA: Diagnosis not present

## 2022-11-21 DIAGNOSIS — C641 Malignant neoplasm of right kidney, except renal pelvis: Secondary | ICD-10-CM | POA: Diagnosis not present

## 2022-11-21 DIAGNOSIS — H8109 Meniere's disease, unspecified ear: Secondary | ICD-10-CM | POA: Diagnosis not present

## 2022-11-21 DIAGNOSIS — Z5112 Encounter for antineoplastic immunotherapy: Secondary | ICD-10-CM | POA: Diagnosis not present

## 2022-11-21 MED ORDER — SODIUM CHLORIDE 0.9 % IV SOLN: Freq: Once | INTRAVENOUS | Status: AC

## 2022-11-21 MED ORDER — HEPARIN SOD (PORK) LOCK FLUSH 100 UNIT/ML IV SOLN: 500.0000 [IU] | Freq: Once | INTRAVENOUS | Status: DC | PRN

## 2022-11-21 MED ORDER — SODIUM CHLORIDE 0.9% FLUSH: 10.0000 mL | INTRAVENOUS | Status: DC | PRN

## 2022-11-21 MED ORDER — SODIUM CHLORIDE 0.9 % IV SOLN
480.0000 mg | Freq: Once | INTRAVENOUS | Status: AC
  Administered 2022-11-21: 480 mg via INTRAVENOUS
  Filled 2022-11-21: qty 48

## 2022-11-21 NOTE — Patient Instructions (Signed)
Nivolumab Injection What is this medication? NIVOLUMAB (nye VOL ue mab) treats some types of cancer. It works by helping your immune system slow or stop the spread of cancer cells. It is a monoclonal antibody. This medicine may be used for other purposes; ask your health care provider or pharmacist if you have questions. COMMON BRAND NAME(S): Opdivo What should I tell my care team before I take this medication? They need to know if you have any of these conditions: Allogeneic stem cell transplant (uses someone else's stem cells) Autoimmune diseases, such as Crohn disease, ulcerative colitis, lupus History of chest radiation Nervous system problems, such as Guillain-Barre syndrome or myasthenia gravis Organ transplant An unusual or allergic reaction to nivolumab, other medications, foods, dyes, or preservatives Pregnant or trying to get pregnant Breast-feeding How should I use this medication? This medication is infused into a vein. It is given in a hospital or clinic setting. A special MedGuide will be given to you before each treatment. Be sure to read this information carefully each time. Talk to your care team about the use of this medication in children. While it may be prescribed for children as young as 12 years for selected conditions, precautions do apply. Overdosage: If you think you have taken too much of this medicine contact a poison control center or emergency room at once. NOTE: This medicine is only for you. Do not share this medicine with others. What if I miss a dose? Keep appointments for follow-up doses. It is important not to miss your dose. Call your care team if you are unable to keep an appointment. What may interact with this medication? Interactions have not been studied. This list may not describe all possible interactions. Give your health care provider a list of all the medicines, herbs, non-prescription drugs, or dietary supplements you use. Also tell them if you  smoke, drink alcohol, or use illegal drugs. Some items may interact with your medicine. What should I watch for while using this medication? Your condition will be monitored carefully while you are receiving this medication. You may need blood work while taking this medication. This medication may cause serious skin reactions. They can happen weeks to months after starting the medication. Contact your care team right away if you notice fevers or flu-like symptoms with a rash. The rash may be red or purple and then turn into blisters or peeling of the skin. You may also notice a red rash with swelling of the face, lips, or lymph nodes in your neck or under your arms. Tell your care team right away if you have any change in your eyesight. Talk to your care team if you are pregnant or think you might be pregnant. A negative pregnancy test is required before starting this medication. A reliable form of contraception is recommended while taking this medication and for 5 months after the last dose. Talk to your care team about effective forms of contraception. Do not breast-feed while taking this medication and for 5 months after the last dose. What side effects may I notice from receiving this medication? Side effects that you should report to your care team as soon as possible: Allergic reactions--skin rash, itching, hives, swelling of the face, lips, tongue, or throat Dry cough, shortness of breath or trouble breathing Eye pain, redness, irritation, or discharge with blurry or decreased vision Heart muscle inflammation--unusual weakness or fatigue, shortness of breath, chest pain, fast or irregular heartbeat, dizziness, swelling of the ankles, feet, or hands Hormone  gland problems--headache, sensitivity to light, unusual weakness or fatigue, dizziness, fast or irregular heartbeat, increased sensitivity to cold or heat, excessive sweating, constipation, hair loss, increased thirst or amount of urine,  tremors or shaking, irritability Infusion reactions--chest pain, shortness of breath or trouble breathing, feeling faint or lightheaded Kidney injury (glomerulonephritis)--decrease in the amount of urine, red or dark brown urine, foamy or bubbly urine, swelling of the ankles, hands, or feet Liver injury--right upper belly pain, loss of appetite, nausea, light-colored stool, dark yellow or brown urine, yellowing skin or eyes, unusual weakness or fatigue Pain, tingling, or numbness in the hands or feet, muscle weakness, change in vision, confusion or trouble speaking, loss of balance or coordination, trouble walking, seizures Rash, fever, and swollen lymph nodes Redness, blistering, peeling, or loosening of the skin, including inside the mouth Sudden or severe stomach pain, bloody diarrhea, fever, nausea, vomiting Side effects that usually do not require medical attention (report these to your care team if they continue or are bothersome): Bone, joint, or muscle pain Diarrhea Fatigue Loss of appetite Nausea Skin rash This list may not describe all possible side effects. Call your doctor for medical advice about side effects. You may report side effects to FDA at 1-800-FDA-1088. Where should I keep my medication? This medication is given in a hospital or clinic. It will not be stored at home. NOTE: This sheet is a summary. It may not cover all possible information. If you have questions about this medicine, talk to your doctor, pharmacist, or health care provider.  2023 Elsevier/Gold Standard (2022-04-08 00:00:00)

## 2022-11-28 ENCOUNTER — Encounter: Payer: Self-pay | Admitting: Oncology

## 2022-12-02 ENCOUNTER — Other Ambulatory Visit: Payer: Self-pay | Admitting: Oncology

## 2022-12-12 LAB — TSH: TSH: 1.64

## 2022-12-12 LAB — T4, FREE: Free T4: 1.01

## 2022-12-16 NOTE — Progress Notes (Signed)
[facility]  [address] [place],  [place]  [address] [phone]  Clinic Day:  12/17/2022  Referring physician: Mateo Flow, MD   HISTORY OF PRESENT ILLNESS:  The patient is a 71 y.o. female with metastatic renal cell carcinoma, proven per a right-sided thoracentesis in early December 2021.  She comes in today to be evaluated before heading into her 22nd cycles of maintenance nivolumab. The patient tolerated her 21st cycle well without having any significant difficulties.  She believes her shortness of breath has gotten mildly worse.  However, it is not to the point where she believes a right-sided thoracentesis needs to be done.  Of note, this patient's maintenance nivolumab was preceded by 3 cycles of nivolumab/ipilimumab; a 4th cycle was not given due to her being hospitalized for severe colitis related to the ipilimumab component of her combination immunotherapy.  She denies having any new problems which concern her for disease progression.      PHYSICAL EXAM:  Blood pressure (!) 141/96, pulse 85, temperature 98.6 F (37 C), resp. rate 16, height 5\' 1"  (1.549 m), weight 150 lb 9.6 oz (68.3 kg), SpO2 95 %. Wt Readings from Last 3 Encounters:  12/17/22 150 lb 9.6 oz (68.3 kg)  11/21/22 148 lb 12 oz (67.5 kg)  11/20/22 148 lb 1.6 oz (67.2 kg)   Body mass index is 28.46 kg/m.  Performance status (ECOG): 1 - Symptomatic but completely ambulatory  Physical Exam Vitals and nursing note reviewed.  Constitutional:      General: She is not in acute distress.    Appearance: Normal appearance.  HENT:     Head: Normocephalic and atraumatic.     Mouth/Throat:     Mouth: Mucous membranes are moist.     Pharynx: Oropharynx is clear. No oropharyngeal exudate or posterior oropharyngeal erythema.  Eyes:     General: No scleral icterus.    Extraocular Movements: Extraocular movements intact.     Conjunctiva/sclera: Conjunctivae normal.     Pupils:  Pupils are equal, round, and reactive to light.  Cardiovascular:     Rate and Rhythm: Normal rate and regular rhythm.     Heart sounds: Normal heart sounds. No murmur heard.    No friction rub. No gallop.  Pulmonary:     Effort: Pulmonary effort is normal.     Breath sounds: Examination of the right-lower field reveals decreased breath sounds. Decreased breath sounds present. No wheezing, rhonchi or rales.  Chest:     Chest wall: No lacerations, swelling, tenderness or crepitus.  Abdominal:     General: There is no distension.     Palpations: Abdomen is soft. There is no hepatomegaly, splenomegaly or mass.     Tenderness: There is no abdominal tenderness.  Musculoskeletal:        General: Normal range of motion.     Cervical back: Normal range of motion and neck supple. No tenderness.     Right lower leg: No edema.     Left lower leg: No edema.  Lymphadenopathy:     Cervical: No cervical adenopathy.     Upper Body:     Right upper body: No supraclavicular or axillary adenopathy.     Left upper body: No supraclavicular or axillary adenopathy.     Lower Body: No right inguinal adenopathy. No left inguinal adenopathy.  Skin:    General: Skin is warm and dry.     Coloration: Skin is not jaundiced.  Findings: No ecchymosis or rash.  Neurological:     Mental Status: She is alert and oriented to person, place, and time.     Cranial Nerves: No cranial nerve deficit.  Psychiatric:        Mood and Affect: Mood normal.        Behavior: Behavior normal.        Thought Content: Thought content normal.    LABS:     ASSESSMENT & PLAN:  Assessment/Plan:  71 y.o. female with metastatic renal cell carcinoma.  She will proceed with her 22nd cycle of maintenance nivolumab this week.  Clinically, she appears to be doing well.  I will see her back in 4 weeks before she heads into her 23rd cycle of maintenance nivolumab.  The patient understands all the plans discussed today and is in  agreement with them.     Voshon Petro Macarthur Critchley, MD

## 2022-12-17 ENCOUNTER — Inpatient Hospital Stay: Payer: Medicare PPO | Attending: Oncology | Admitting: Oncology

## 2022-12-17 ENCOUNTER — Inpatient Hospital Stay: Payer: Medicare PPO

## 2022-12-17 DIAGNOSIS — Z5112 Encounter for antineoplastic immunotherapy: Secondary | ICD-10-CM | POA: Insufficient documentation

## 2022-12-17 DIAGNOSIS — D3001 Benign neoplasm of right kidney: Secondary | ICD-10-CM | POA: Diagnosis not present

## 2022-12-17 DIAGNOSIS — C649 Malignant neoplasm of unspecified kidney, except renal pelvis: Secondary | ICD-10-CM | POA: Insufficient documentation

## 2022-12-17 DIAGNOSIS — Z79899 Other long term (current) drug therapy: Secondary | ICD-10-CM | POA: Diagnosis not present

## 2022-12-17 DIAGNOSIS — D649 Anemia, unspecified: Secondary | ICD-10-CM | POA: Diagnosis not present

## 2022-12-17 LAB — CMP (CANCER CENTER ONLY)
ALT: 27 U/L (ref 0–44)
AST: 25 U/L (ref 15–41)
Albumin: 4.5 g/dL (ref 3.5–5.0)
Alkaline Phosphatase: 69 U/L (ref 38–126)
Anion gap: 10 (ref 5–15)
BUN: 10 mg/dL (ref 8–23)
CO2: 24 mmol/L (ref 22–32)
Calcium: 8.9 mg/dL (ref 8.9–10.3)
Chloride: 107 mmol/L (ref 98–111)
Creatinine: 0.72 mg/dL (ref 0.44–1.00)
GFR, Estimated: >60 mL/min (ref >60)
Glucose, Bld: 102 mg/dL — ABNORMAL HIGH (ref 70–99)
Potassium: 3.5 mmol/L (ref 3.5–5.1)
Sodium: 141 mmol/L (ref 135–145)
Total Bilirubin: 0.6 mg/dL (ref 0.3–1.2)
Total Protein: 7.3 g/dL (ref 6.5–8.1)

## 2022-12-17 LAB — CBC AND DIFFERENTIAL
HCT: 42 (ref 36–46)
Hemoglobin: 14.1 (ref 12.0–16.0)
Neutrophils Absolute: 4.01
Platelets: 178 10*3/uL (ref 150–400)
WBC: 6.8

## 2022-12-17 LAB — CBC W DIFFERENTIAL (~~LOC~~ CC SCANNED REPORT)

## 2022-12-17 LAB — TSH: TSH: 1.449 u[IU]/mL (ref 0.350–4.500)

## 2022-12-17 LAB — CBC: RBC: 4.2 (ref 3.87–5.11)

## 2022-12-18 ENCOUNTER — Other Ambulatory Visit: Payer: Self-pay

## 2022-12-18 ENCOUNTER — Other Ambulatory Visit: Payer: Self-pay | Admitting: Oncology

## 2022-12-18 LAB — T4: T4, Total: 5.2 ug/dL (ref 4.5–12.0)

## 2022-12-18 MED ORDER — TRIAMCINOLONE ACETONIDE 0.1 % EX CREA: 1.0000 | TOPICAL_CREAM | Freq: Two times a day (BID) | CUTANEOUS | 2 refills | Status: DC

## 2022-12-18 MED FILL — Nivolumab IV Soln 240 MG/24ML: INTRAVENOUS | Qty: 48 | Status: AC

## 2022-12-19 ENCOUNTER — Inpatient Hospital Stay: Payer: Medicare PPO

## 2022-12-19 VITALS — BP 146/77 | HR 73 | Temp 98.6°F | Resp 18 | Ht 61.0 in | Wt 150.8 lb

## 2022-12-19 DIAGNOSIS — Z5112 Encounter for antineoplastic immunotherapy: Secondary | ICD-10-CM | POA: Diagnosis not present

## 2022-12-19 DIAGNOSIS — D3001 Benign neoplasm of right kidney: Secondary | ICD-10-CM

## 2022-12-19 DIAGNOSIS — Z79899 Other long term (current) drug therapy: Secondary | ICD-10-CM | POA: Diagnosis not present

## 2022-12-19 DIAGNOSIS — C649 Malignant neoplasm of unspecified kidney, except renal pelvis: Secondary | ICD-10-CM | POA: Diagnosis not present

## 2022-12-19 MED ORDER — SODIUM CHLORIDE 0.9 % IV SOLN
480.0000 mg | Freq: Once | INTRAVENOUS | Status: AC
  Administered 2022-12-19: 480 mg via INTRAVENOUS
  Filled 2022-12-19: qty 48

## 2022-12-19 MED ORDER — HEPARIN SOD (PORK) LOCK FLUSH 100 UNIT/ML IV SOLN
500.0000 [IU] | Freq: Once | INTRAVENOUS | Status: AC | PRN
  Administered 2022-12-19: 500 [IU]

## 2022-12-19 MED ORDER — SODIUM CHLORIDE 0.9% FLUSH
10.0000 mL | INTRAVENOUS | Status: DC | PRN
  Administered 2022-12-19: 10 mL

## 2022-12-19 MED ORDER — SODIUM CHLORIDE 0.9 % IV SOLN: Freq: Once | INTRAVENOUS | Status: AC

## 2022-12-19 NOTE — Patient Instructions (Signed)
Nivolumab Injection What is this medication? NIVOLUMAB (nye VOL ue mab) treats some types of cancer. It works by helping your immune system slow or stop the spread of cancer cells. It is a monoclonal antibody. This medicine may be used for other purposes; ask your health care provider or pharmacist if you have questions. COMMON BRAND NAME(S): Opdivo What should I tell my care team before I take this medication? They need to know if you have any of these conditions: Allogeneic stem cell transplant (uses someone else's stem cells) Autoimmune diseases, such as Crohn disease, ulcerative colitis, lupus History of chest radiation Nervous system problems, such as Guillain-Barre syndrome or myasthenia gravis Organ transplant An unusual or allergic reaction to nivolumab, other medications, foods, dyes, or preservatives Pregnant or trying to get pregnant Breast-feeding How should I use this medication? This medication is infused into a vein. It is given in a hospital or clinic setting. A special MedGuide will be given to you before each treatment. Be sure to read this information carefully each time. Talk to your care team about the use of this medication in children. While it may be prescribed for children as young as 12 years for selected conditions, precautions do apply. Overdosage: If you think you have taken too much of this medicine contact a poison control center or emergency room at once. NOTE: This medicine is only for you. Do not share this medicine with others. What if I miss a dose? Keep appointments for follow-up doses. It is important not to miss your dose. Call your care team if you are unable to keep an appointment. What may interact with this medication? Interactions have not been studied. This list may not describe all possible interactions. Give your health care provider a list of all the medicines, herbs, non-prescription drugs, or dietary supplements you use. Also tell them if you  smoke, drink alcohol, or use illegal drugs. Some items may interact with your medicine. What should I watch for while using this medication? Your condition will be monitored carefully while you are receiving this medication. You may need blood work while taking this medication. This medication may cause serious skin reactions. They can happen weeks to months after starting the medication. Contact your care team right away if you notice fevers or flu-like symptoms with a rash. The rash may be red or purple and then turn into blisters or peeling of the skin. You may also notice a red rash with swelling of the face, lips, or lymph nodes in your neck or under your arms. Tell your care team right away if you have any change in your eyesight. Talk to your care team if you are pregnant or think you might be pregnant. A negative pregnancy test is required before starting this medication. A reliable form of contraception is recommended while taking this medication and for 5 months after the last dose. Talk to your care team about effective forms of contraception. Do not breast-feed while taking this medication and for 5 months after the last dose. What side effects may I notice from receiving this medication? Side effects that you should report to your care team as soon as possible: Allergic reactions--skin rash, itching, hives, swelling of the face, lips, tongue, or throat Dry cough, shortness of breath or trouble breathing Eye pain, redness, irritation, or discharge with blurry or decreased vision Heart muscle inflammation--unusual weakness or fatigue, shortness of breath, chest pain, fast or irregular heartbeat, dizziness, swelling of the ankles, feet, or hands Hormone  gland problems--headache, sensitivity to light, unusual weakness or fatigue, dizziness, fast or irregular heartbeat, increased sensitivity to cold or heat, excessive sweating, constipation, hair loss, increased thirst or amount of urine,  tremors or shaking, irritability Infusion reactions--chest pain, shortness of breath or trouble breathing, feeling faint or lightheaded Kidney injury (glomerulonephritis)--decrease in the amount of urine, red or dark brown urine, foamy or bubbly urine, swelling of the ankles, hands, or feet Liver injury--right upper belly pain, loss of appetite, nausea, light-colored stool, dark yellow or brown urine, yellowing skin or eyes, unusual weakness or fatigue Pain, tingling, or numbness in the hands or feet, muscle weakness, change in vision, confusion or trouble speaking, loss of balance or coordination, trouble walking, seizures Rash, fever, and swollen lymph nodes Redness, blistering, peeling, or loosening of the skin, including inside the mouth Sudden or severe stomach pain, bloody diarrhea, fever, nausea, vomiting Side effects that usually do not require medical attention (report these to your care team if they continue or are bothersome): Bone, joint, or muscle pain Diarrhea Fatigue Loss of appetite Nausea Skin rash This list may not describe all possible side effects. Call your doctor for medical advice about side effects. You may report side effects to FDA at 1-800-FDA-1088. Where should I keep my medication? This medication is given in a hospital or clinic. It will not be stored at home. NOTE: This sheet is a summary. It may not cover all possible information. If you have questions about this medicine, talk to your doctor, pharmacist, or health care provider.  2023 Elsevier/Gold Standard (2022-04-08 00:00:00) [facility]  Discharge Instructions: Thank you for choosing [facility] to provide your oncology and hematology care.  If you have a lab appointment with the [facility], please go directly to the [facility] and check in at the registration area.   Wear comfortable clothing and clothing appropriate for easy access to any Portacath or PICC  line.   We strive to give you quality time with your provider. You may need to reschedule your appointment if you arrive late (15 or more minutes).  Arriving late affects you and other patients whose appointments are after yours.  Also, if you miss three or more appointments without notifying the office, you may be dismissed from the clinic at the provider's discretion.      For prescription refill requests, have your pharmacy contact our office and allow 72 hours for refills to be completed.    Today you received the following chemotherapy and/or immunotherapy agents nivolumab      To help prevent nausea and vomiting after your treatment, we encourage you to take your nausea medication as directed.  BELOW ARE SYMPTOMS THAT SHOULD BE REPORTED IMMEDIATELY: *FEVER GREATER THAN 100.4 F (38 C) OR HIGHER *CHILLS OR SWEATING *NAUSEA AND VOMITING THAT IS NOT CONTROLLED WITH YOUR NAUSEA MEDICATION *UNUSUAL SHORTNESS OF BREATH *UNUSUAL BRUISING OR BLEEDING *URINARY PROBLEMS (pain or burning when urinating, or frequent urination) *BOWEL PROBLEMS (unusual diarrhea, constipation, pain near the anus) TENDERNESS IN MOUTH AND THROAT WITH OR WITHOUT PRESENCE OF ULCERS (sore throat, sores in mouth, or a toothache) UNUSUAL RASH, SWELLING OR PAIN  UNUSUAL VAGINAL DISCHARGE OR ITCHING   Items with * indicate a potential emergency and should be followed up as soon as possible or go to the Emergency Department if any problems should occur.  Please show the CHEMOTHERAPY ALERT CARD or IMMUNOTHERAPY ALERT CARD at check-in to the Emergency Department and triage nurse.  Should you have questions after your visit or need to cancel or reschedule your appointment, please contact [facility]  Dept: [phone]  and follow the prompts.  Office hours are 8:00 a.m. to 4:30 p.m. Monday - Friday. Please note that voicemails left after 4:00 p.m. may not be returned until the following business day.   We are closed weekends and major holidays. You have access to a nurse at all times for urgent questions. Please call the main number to the clinic Dept: [phone] and follow the prompts.  For any non-urgent questions, you may also contact your provider using MyChart. We now offer e-Visits for anyone 22 and older to request care online for non-urgent symptoms. For details visit [email or web address].   Also download the MyChart app! Go to the app store, search "MyChart", open the app, select [facility], and log in with your MyChart username and password.

## 2022-12-31 ENCOUNTER — Other Ambulatory Visit: Payer: Self-pay

## 2022-12-31 ENCOUNTER — Telehealth: Payer: Self-pay | Admitting: Oncology

## 2022-12-31 NOTE — Telephone Encounter (Signed)
12/31/22 Spoke with patient and Thoracentesis 01/02/23@1pm .

## 2023-01-02 DIAGNOSIS — J90 Pleural effusion, not elsewhere classified: Secondary | ICD-10-CM | POA: Diagnosis not present

## 2023-01-02 LAB — CBC AND DIFFERENTIAL
HCT: 41 (ref 36–46)
Hemoglobin: 14 (ref 12.0–16.0)
Neutrophils Absolute: 4.29
Platelets: 208 10*3/uL (ref 150–400)
WBC: 6.5

## 2023-01-02 LAB — BASIC METABOLIC PANEL
BUN: 13 (ref 4–21)
CO2: 27 — AB (ref 13–22)
Chloride: 103 (ref 99–108)
Creatinine: 0.7 (ref 0.5–1.1)
Glucose: 123
Potassium: 4.2 mEq/L (ref 3.5–5.1)
Sodium: 139 (ref 137–147)

## 2023-01-02 LAB — COMPREHENSIVE METABOLIC PANEL
Albumin: 4.4 (ref 3.5–5.0)
Calcium: 9.3 (ref 8.7–10.7)

## 2023-01-02 LAB — HEPATIC FUNCTION PANEL
ALT: 33 U/L (ref 7–35)
AST: 35 (ref 13–35)
Alkaline Phosphatase: 85 (ref 25–125)
Bilirubin, Total: 0.5

## 2023-01-02 LAB — CBC: RBC: 4.17 (ref 3.87–5.11)

## 2023-01-07 ENCOUNTER — Encounter: Payer: Self-pay | Admitting: Oncology

## 2023-01-08 ENCOUNTER — Encounter: Payer: Self-pay | Admitting: Oncology

## 2023-01-10 ENCOUNTER — Other Ambulatory Visit: Payer: Self-pay | Admitting: Oncology

## 2023-01-10 DIAGNOSIS — D3001 Benign neoplasm of right kidney: Secondary | ICD-10-CM

## 2023-01-13 NOTE — Progress Notes (Signed)
[name],  [name]  [id number] [phone]  Clinic Day:  12/27/2022  Referring physician: Lise Auer, [name]   HISTORY OF PRESENT ILLNESS:  The patient is a 72 y.o. female with metastatic renal cell [name] [name] per cytology from a right-sided thoracentesis in early December 2021.  She comes in today to be evaluated before heading into her 23rd cycle of maintenance nivolumab. The patient tolerated her 22nd cycle well without having any significant difficulties. Of note, since her last visit, the patient did undergo a repeat right-sided thoracentesis due to her having increased shortness of breath and fatigue.  One liter of fluid was removed, which came back negative for malignant cells.  She definitely believes her shortness of breath has gotten better since she had her thoracentesis.  Of note, this patient's maintenance nivolumab was preceded by 3 cycles of nivolumab/ipilimumab; a 4th cycle was not given due to her being hospitalized for severe colitis related to the ipilimumab component of her combination immunotherapy.  She denies having any new problems which concern her for disease progression.      PHYSICAL EXAM:  Blood pressure (!) 150/70, pulse 75, temperature 98.7 F (37.1 C), resp. rate 16, height 5\' 1"  (1.549 m), weight 149 lb 3.2 oz (67.7 kg), SpO2 93 %. Wt Readings from Last 3 Encounters:  01/14/23 149 lb 3.2 oz (67.7 kg)  12/19/22 150 lb 12 oz (68.4 kg)  12/17/22 150 lb 9.6 oz (68.3 kg)   Body mass index is 28.19 kg/m.  Performance status (ECOG): 1 - Symptomatic but completely ambulatory  Physical Exam Vitals and nursing note reviewed.  Constitutional:      General: She is not in acute distress.    Appearance: Normal appearance.  HENT:     Head: Normocephalic and atraumatic.     Mouth/Throat:     Mouth: Mucous membranes are moist.     Pharynx: Oropharynx is clear. No oropharyngeal exudate or posterior  oropharyngeal erythema.  Eyes:     General: No scleral icterus.    Extraocular Movements: Extraocular movements intact.     Conjunctiva/sclera: Conjunctivae normal.     Pupils: Pupils are equal, round, and reactive to light.  Cardiovascular:     Rate and Rhythm: Normal rate and regular rhythm.     Heart sounds: Normal heart sounds. No murmur heard.    No friction rub. No gallop.  Pulmonary:     Effort: Pulmonary effort is normal.     Breath sounds: Examination of the right-lower field reveals decreased breath sounds. Decreased breath sounds present. No wheezing, rhonchi or rales.  Chest:     Chest wall: No lacerations, swelling, tenderness or crepitus.  Abdominal:     General: There is no distension.     Palpations: Abdomen is soft. There is no hepatomegaly, splenomegaly or mass.     Tenderness: There is no abdominal tenderness.  Musculoskeletal:        General: Normal range of motion.     Cervical back: Normal range of motion and neck supple. No tenderness.     Right lower leg: No edema.     Left lower leg: No edema.  Lymphadenopathy:     Cervical: No cervical adenopathy.     Upper Body:     Right upper body: No supraclavicular or axillary adenopathy.     Left upper body: No supraclavicular or axillary adenopathy.     Lower Body: No right inguinal adenopathy. No  left inguinal adenopathy.  Skin:    General: Skin is warm and dry.     Coloration: Skin is not jaundiced.     Findings: No ecchymosis or rash.  Neurological:     Mental Status: She is alert and oriented to person, place, and time.     Cranial Nerves: No cranial nerve deficit.  Psychiatric:        Mood and Affect: Mood normal.        Behavior: Behavior normal.        Thought Content: Thought content normal.    LABS:     ASSESSMENT & PLAN:  Assessment/Plan:  72 y.o. female with metastatic renal cell [name].  She will proceed with her 23rd cycle of maintenance nivolumab this week.  Clinically, she appears to  be doing well.  I will see her back in 4 weeks before she heads into her 24th cycle of maintenance nivolumab.  The patient understands all the plans discussed today and is in agreement with them.     Tanaysia Bhardwaj Kirby Funk, [name]

## 2023-01-14 ENCOUNTER — Inpatient Hospital Stay: Payer: Medicare PPO

## 2023-01-14 ENCOUNTER — Inpatient Hospital Stay: Payer: Medicare PPO | Attending: Oncology | Admitting: Oncology

## 2023-01-14 DIAGNOSIS — D649 Anemia, unspecified: Secondary | ICD-10-CM | POA: Diagnosis not present

## 2023-01-14 DIAGNOSIS — D3001 Benign neoplasm of right kidney: Secondary | ICD-10-CM

## 2023-01-14 DIAGNOSIS — C649 Malignant neoplasm of unspecified kidney, except renal pelvis: Secondary | ICD-10-CM | POA: Diagnosis not present

## 2023-01-14 DIAGNOSIS — Z79899 Other long term (current) drug therapy: Secondary | ICD-10-CM | POA: Insufficient documentation

## 2023-01-14 DIAGNOSIS — Z5112 Encounter for antineoplastic immunotherapy: Secondary | ICD-10-CM | POA: Diagnosis not present

## 2023-01-14 LAB — CMP (CANCER CENTER ONLY)
ALT: 27 U/L (ref 0–44)
AST: 29 U/L (ref 15–41)
Albumin: 4.2 g/dL (ref 3.5–5.0)
Alkaline Phosphatase: 65 U/L (ref 38–126)
Anion gap: 9 (ref 5–15)
BUN: 13 mg/dL (ref 8–23)
CO2: 26 mmol/L (ref 22–32)
Calcium: 9 mg/dL (ref 8.9–10.3)
Chloride: 104 mmol/L (ref 98–111)
Creatinine: 0.87 mg/dL (ref 0.44–1.00)
GFR, Estimated: >60 mL/min (ref >60)
Glucose, Bld: 140 mg/dL — ABNORMAL HIGH (ref 70–99)
Potassium: 3.7 mmol/L (ref 3.5–5.1)
Sodium: 139 mmol/L (ref 135–145)
Total Bilirubin: 0.1 mg/dL — ABNORMAL LOW (ref 0.3–1.2)
Total Protein: 7.5 g/dL (ref 6.5–8.1)

## 2023-01-14 LAB — CBC AND DIFFERENTIAL
HCT: 41 (ref 36–46)
Hemoglobin: 13.9 (ref 12.0–16.0)
Neutrophils Absolute: 5.36
Platelets: 209 10*3/uL (ref 150–400)
WBC: 8

## 2023-01-14 LAB — CBC: RBC: 4.22 (ref 3.87–5.11)

## 2023-01-14 LAB — TSH: TSH: 1.174 u[IU]/mL (ref 0.350–4.500)

## 2023-01-14 LAB — CBC W DIFFERENTIAL (~~LOC~~ CC SCANNED REPORT)

## 2023-01-15 MED FILL — Nivolumab IV Soln 100 MG/10ML: INTRAVENOUS | Qty: 48 | Status: AC

## 2023-01-16 ENCOUNTER — Inpatient Hospital Stay: Payer: Medicare PPO

## 2023-01-16 VITALS — BP 137/65 | HR 68 | Temp 97.6°F | Resp 20 | Ht 61.0 in | Wt 147.0 lb

## 2023-01-16 DIAGNOSIS — Z5112 Encounter for antineoplastic immunotherapy: Secondary | ICD-10-CM | POA: Diagnosis not present

## 2023-01-16 DIAGNOSIS — C649 Malignant neoplasm of unspecified kidney, except renal pelvis: Secondary | ICD-10-CM | POA: Diagnosis not present

## 2023-01-16 DIAGNOSIS — Z79899 Other long term (current) drug therapy: Secondary | ICD-10-CM | POA: Diagnosis not present

## 2023-01-16 DIAGNOSIS — D3001 Benign neoplasm of right kidney: Secondary | ICD-10-CM

## 2023-01-16 LAB — T4: T4, Total: 5.5 ug/dL (ref 4.5–12.0)

## 2023-01-16 MED ORDER — SODIUM CHLORIDE 0.9 % IV SOLN
480.0000 mg | Freq: Once | INTRAVENOUS | Status: AC
  Administered 2023-01-16: 480 mg via INTRAVENOUS
  Filled 2023-01-16: qty 48

## 2023-01-16 MED ORDER — HEPARIN SOD (PORK) LOCK FLUSH 100 UNIT/ML IV SOLN
500.0000 [IU] | Freq: Once | INTRAVENOUS | Status: AC | PRN
  Administered 2023-01-16: 500 [IU]

## 2023-01-16 MED ORDER — SODIUM CHLORIDE 0.9 % IV SOLN: Freq: Once | INTRAVENOUS | Status: AC

## 2023-01-16 MED ORDER — SODIUM CHLORIDE 0.9% FLUSH
10.0000 mL | INTRAVENOUS | Status: DC | PRN
  Administered 2023-01-16: 10 mL

## 2023-01-16 NOTE — Patient Instructions (Signed)
Nivolumab Injection What is this medication? NIVOLUMAB (nye VOL ue mab) treats some types of cancer. It works by helping your immune system slow or stop the spread of cancer cells. It is a monoclonal antibody. This medicine may be used for other purposes; ask your health care provider or pharmacist if you have questions. COMMON BRAND NAME(S): Opdivo What should I tell my care team before I take this medication? They need to know if you have any of these conditions: Allogeneic stem cell transplant (uses someone else's stem cells) Autoimmune diseases, such as Crohn disease, ulcerative colitis, lupus History of chest radiation Nervous system problems, such as Guillain-Barre syndrome or myasthenia gravis Organ transplant An unusual or allergic reaction to nivolumab, other medications, foods, dyes, or preservatives Pregnant or trying to get pregnant Breast-feeding How should I use this medication? This medication is infused into a vein. It is given in a hospital or clinic setting. A special MedGuide will be given to you before each treatment. Be sure to read this information carefully each time. Talk to your care team about the use of this medication in children. While it may be prescribed for children as young as 12 years for selected conditions, precautions do apply. Overdosage: If you think you have taken too much of this medicine contact a poison control center or emergency room at once. NOTE: This medicine is only for you. Do not share this medicine with others. What if I miss a dose? Keep appointments for follow-up doses. It is important not to miss your dose. Call your care team if you are unable to keep an appointment. What may interact with this medication? Interactions have not been studied. This list may not describe all possible interactions. Give your health care provider a list of all the medicines, herbs, non-prescription drugs, or dietary supplements you use. Also tell them if you  smoke, drink alcohol, or use illegal drugs. Some items may interact with your medicine. What should I watch for while using this medication? Your condition will be monitored carefully while you are receiving this medication. You may need blood work while taking this medication. This medication may cause serious skin reactions. They can happen weeks to months after starting the medication. Contact your care team right away if you notice fevers or flu-like symptoms with a rash. The rash may be red or purple and then turn into blisters or peeling of the skin. You may also notice a red rash with swelling of the face, lips, or lymph nodes in your neck or under your arms. Tell your care team right away if you have any change in your eyesight. Talk to your care team if you are pregnant or think you might be pregnant. A negative pregnancy test is required before starting this medication. A reliable form of contraception is recommended while taking this medication and for 5 months after the last dose. Talk to your care team about effective forms of contraception. Do not breast-feed while taking this medication and for 5 months after the last dose. What side effects may I notice from receiving this medication? Side effects that you should report to your care team as soon as possible: Allergic reactions--skin rash, itching, hives, swelling of the face, lips, tongue, or throat Dry cough, shortness of breath or trouble breathing Eye pain, redness, irritation, or discharge with blurry or decreased vision Heart muscle inflammation--unusual weakness or fatigue, shortness of breath, chest pain, fast or irregular heartbeat, dizziness, swelling of the ankles, feet, or hands Hormone  gland problems--headache, sensitivity to light, unusual weakness or fatigue, dizziness, fast or irregular heartbeat, increased sensitivity to cold or heat, excessive sweating, constipation, hair loss, increased thirst or amount of urine,  tremors or shaking, irritability Infusion reactions--chest pain, shortness of breath or trouble breathing, feeling faint or lightheaded Kidney injury (glomerulonephritis)--decrease in the amount of urine, red or dark brown urine, foamy or bubbly urine, swelling of the ankles, hands, or feet Liver injury--right upper belly pain, loss of appetite, nausea, light-colored stool, dark yellow or brown urine, yellowing skin or eyes, unusual weakness or fatigue Pain, tingling, or numbness in the hands or feet, muscle weakness, change in vision, confusion or trouble speaking, loss of balance or coordination, trouble walking, seizures Rash, fever, and swollen lymph nodes Redness, blistering, peeling, or loosening of the skin, including inside the mouth Sudden or severe stomach pain, bloody diarrhea, fever, nausea, vomiting Side effects that usually do not require medical attention (report these to your care team if they continue or are bothersome): Bone, joint, or muscle pain Diarrhea Fatigue Loss of appetite Nausea Skin rash This list may not describe all possible side effects. Call your doctor for medical advice about side effects. You may report side effects to FDA at 1-800-FDA-1088. Where should I keep my medication? This medication is given in a hospital or clinic. It will not be stored at home. NOTE: This sheet is a summary. It may not cover all possible information. If you have questions about this medicine, talk to your doctor, pharmacist, or health care provider.  2023 Elsevier/Gold Standard (2022-04-08 00:00:00)

## 2023-02-09 NOTE — Progress Notes (Unsigned)
[name] [name]  [address] [place],  [place]  [id number] [phone]  Clinic Day:  02/10/2023  Referring physician: Mateo Flow, MD  HISTORY OF PRESENT ILLNESS:  The patient is a 72 y.o. female with metastatic renal cell carcinoma, proven per cytology from a right-sided thoracentesis in early December 2021.  She comes in today to be evaluated before heading into her 24th cycle of maintenance nivolumab. The patient tolerated her 23rd cycle well without having any significant difficulties. Of note, this patient's maintenance nivolumab was preceded by 3 cycles of nivolumab/ipilimumab; a 4th cycle was not given due to her being hospitalized for severe colitis related to the ipilimumab component of her combination immunotherapy.  Since her last visit, the patient has been doing well.  Other than minimal shortness of breath, she denies having any new problems which concern her for disease progression.      PHYSICAL EXAM:  Blood pressure (!) 143/104, pulse 77, temperature 97.9 F (36.6 C), resp. rate 16, height 5\' 1"  (1.549 m), weight 149 lb 4.8 oz (67.7 kg), SpO2 95 %. Wt Readings from Last 3 Encounters:  02/10/23 149 lb 4.8 oz (67.7 kg)  01/16/23 147 lb (66.7 kg)  01/14/23 149 lb 3.2 oz (67.7 kg)   Body mass index is 28.21 kg/m.  Performance status (ECOG): 1 - Symptomatic but completely ambulatory  Physical Exam Vitals and nursing note reviewed.  Constitutional:      General: She is not in acute distress.    Appearance: Normal appearance.  HENT:     Head: Normocephalic and atraumatic.     Mouth/Throat:     Mouth: Mucous membranes are moist.     Pharynx: Oropharynx is clear. No oropharyngeal exudate or posterior oropharyngeal erythema.  Eyes:     General: No scleral icterus.    Extraocular Movements: Extraocular movements intact.     Conjunctiva/sclera: Conjunctivae normal.     Pupils: Pupils are equal, round, and reactive to light.   Cardiovascular:     Rate and Rhythm: Normal rate and regular rhythm.     Heart sounds: Normal heart sounds. No murmur heard.    No friction rub. No gallop.  Pulmonary:     Effort: Pulmonary effort is normal.     Breath sounds: Examination of the right-lower field reveals decreased breath sounds. Decreased breath sounds present. No wheezing, rhonchi or rales.  Chest:     Chest wall: No lacerations, swelling, tenderness or crepitus.  Abdominal:     General: There is no distension.     Palpations: Abdomen is soft. There is no hepatomegaly, splenomegaly or mass.     Tenderness: There is no abdominal tenderness.  Musculoskeletal:        General: Normal range of motion.     Cervical back: Normal range of motion and neck supple. No tenderness.     Right lower leg: No edema.     Left lower leg: No edema.  Lymphadenopathy:     Cervical: No cervical adenopathy.     Upper Body:     Right upper body: No supraclavicular or axillary adenopathy.     Left upper body: No supraclavicular or axillary adenopathy.     Lower Body: No right inguinal adenopathy. No left inguinal adenopathy.  Skin:    General: Skin is warm and dry.     Coloration: Skin is not jaundiced.     Findings: No ecchymosis or rash.  Neurological:     Mental Status: She  is alert and oriented to person, place, and time.     Cranial Nerves: No cranial nerve deficit.  Psychiatric:        Mood and Affect: Mood normal.        Behavior: Behavior normal.        Thought Content: Thought content normal.    LABS:    Latest Reference Range & Units 02/10/23 00:00 02/10/23 10:18  Sodium 135 - 145 mmol/L  143  Potassium 3.5 - 5.1 mmol/L  3.8  Chloride 98 - 111 mmol/L  104  CO2 22 - 32 mmol/L  26  Glucose 70 - 99 mg/dL  150 (H)  BUN 8 - 23 mg/dL  14  Creatinine 0.44 - 1.00 mg/dL  0.76  Calcium 8.9 - 10.3 mg/dL  8.9  Anion gap 5 - 15   13  Alkaline Phosphatase 38 - 126 U/L  66  Albumin 3.5 - 5.0 g/dL  4.6  AST 15 - 41 U/L  28   ALT 0 - 44 U/L  26  Total Protein 6.5 - 8.1 g/dL  7.5  Total Bilirubin 0.3 - 1.2 mg/dL  0.6  GFR, Est Non African American >60 mL/min  >60  CBC W DIFFERENTIAL ([facility] SCANNED REPORT)   Rpt  WBC  6.8 (E)   RBC 3.87 - 5.11  4.34 (E)   Hemoglobin 12.0 - 16.0  14.1 (E)   HCT 36 - 46  42 (E)   Platelets 150 - 400 K/uL 181 (E)   NEUT#  4.15 (E)   (H): Data is abnormally high Rpt: View report in Results Review for more information (E): External lab result  ASSESSMENT & PLAN:  Assessment/Plan:  72 y.o. female with metastatic renal cell carcinoma.  She will proceed with her 24th cycle of maintenance nivolumab this week.  Clinically, she appears to be doing well.  I will see her back in 4 weeks before she heads into her 25th cycle of maintenance nivolumab.  The patient understands all the plans discussed today and is in agreement with them.     Asier Desroches Macarthur Critchley, MD

## 2023-02-10 ENCOUNTER — Telehealth: Payer: Self-pay | Admitting: Oncology

## 2023-02-10 ENCOUNTER — Inpatient Hospital Stay: Payer: Medicare PPO

## 2023-02-10 ENCOUNTER — Inpatient Hospital Stay: Payer: Medicare PPO | Attending: Oncology | Admitting: Oncology

## 2023-02-10 DIAGNOSIS — Z79899 Other long term (current) drug therapy: Secondary | ICD-10-CM | POA: Diagnosis not present

## 2023-02-10 DIAGNOSIS — D3001 Benign neoplasm of right kidney: Secondary | ICD-10-CM

## 2023-02-10 DIAGNOSIS — C641 Malignant neoplasm of right kidney, except renal pelvis: Secondary | ICD-10-CM | POA: Diagnosis not present

## 2023-02-10 DIAGNOSIS — D649 Anemia, unspecified: Secondary | ICD-10-CM | POA: Diagnosis not present

## 2023-02-10 DIAGNOSIS — C649 Malignant neoplasm of unspecified kidney, except renal pelvis: Secondary | ICD-10-CM | POA: Diagnosis not present

## 2023-02-10 DIAGNOSIS — Z5112 Encounter for antineoplastic immunotherapy: Secondary | ICD-10-CM | POA: Insufficient documentation

## 2023-02-10 LAB — CMP (CANCER CENTER ONLY)
ALT: 26 U/L (ref 0–44)
AST: 28 U/L (ref 15–41)
Albumin: 4.6 g/dL (ref 3.5–5.0)
Alkaline Phosphatase: 66 U/L (ref 38–126)
Anion gap: 13 (ref 5–15)
BUN: 14 mg/dL (ref 8–23)
CO2: 26 mmol/L (ref 22–32)
Calcium: 8.9 mg/dL (ref 8.9–10.3)
Chloride: 104 mmol/L (ref 98–111)
Creatinine: 0.76 mg/dL (ref 0.44–1.00)
GFR, Estimated: >60 mL/min (ref >60)
Glucose, Bld: 150 mg/dL — ABNORMAL HIGH (ref 70–99)
Potassium: 3.8 mmol/L (ref 3.5–5.1)
Sodium: 143 mmol/L (ref 135–145)
Total Bilirubin: 0.6 mg/dL (ref 0.3–1.2)
Total Protein: 7.5 g/dL (ref 6.5–8.1)

## 2023-02-10 LAB — CBC AND DIFFERENTIAL
HCT: 42 (ref 36–46)
Hemoglobin: 14.1 (ref 12.0–16.0)
Neutrophils Absolute: 4.15
Platelets: 181 10*3/uL (ref 150–400)
WBC: 6.8

## 2023-02-10 LAB — CBC W DIFFERENTIAL (~~LOC~~ CC SCANNED REPORT)

## 2023-02-10 LAB — CBC: RBC: 4.34 (ref 3.87–5.11)

## 2023-02-10 LAB — TSH: TSH: 1.897 u[IU]/mL (ref 0.350–4.500)

## 2023-02-10 NOTE — Telephone Encounter (Signed)
02/10/23 Next appt scheduled and confirmed with patient

## 2023-02-11 ENCOUNTER — Other Ambulatory Visit: Payer: Self-pay

## 2023-02-11 ENCOUNTER — Other Ambulatory Visit: Payer: Self-pay | Admitting: Oncology

## 2023-02-11 DIAGNOSIS — Z9189 Other specified personal risk factors, not elsewhere classified: Secondary | ICD-10-CM | POA: Diagnosis not present

## 2023-02-11 DIAGNOSIS — Z01419 Encounter for gynecological examination (general) (routine) without abnormal findings: Secondary | ICD-10-CM | POA: Diagnosis not present

## 2023-02-11 DIAGNOSIS — C642 Malignant neoplasm of left kidney, except renal pelvis: Secondary | ICD-10-CM | POA: Diagnosis not present

## 2023-02-11 DIAGNOSIS — Z0000 Encounter for general adult medical examination without abnormal findings: Secondary | ICD-10-CM | POA: Diagnosis not present

## 2023-02-11 DIAGNOSIS — Z79890 Hormone replacement therapy (postmenopausal): Secondary | ICD-10-CM | POA: Diagnosis not present

## 2023-02-11 MED ORDER — TRIAMCINOLONE ACETONIDE 0.1 % EX CREA: 1.0000 | TOPICAL_CREAM | Freq: Two times a day (BID) | CUTANEOUS | 0 refills | Status: AC

## 2023-02-12 LAB — T4: T4, Total: 5.7 ug/dL (ref 4.5–12.0)

## 2023-02-12 MED FILL — Nivolumab IV Soln 240 MG/24ML: INTRAVENOUS | Qty: 48 | Status: AC

## 2023-02-13 ENCOUNTER — Inpatient Hospital Stay: Payer: Medicare PPO

## 2023-02-13 VITALS — BP 158/53 | HR 64 | Temp 98.0°F | Resp 18

## 2023-02-13 DIAGNOSIS — C641 Malignant neoplasm of right kidney, except renal pelvis: Secondary | ICD-10-CM | POA: Diagnosis not present

## 2023-02-13 DIAGNOSIS — Z79899 Other long term (current) drug therapy: Secondary | ICD-10-CM | POA: Diagnosis not present

## 2023-02-13 DIAGNOSIS — D3001 Benign neoplasm of right kidney: Secondary | ICD-10-CM

## 2023-02-13 DIAGNOSIS — Z5112 Encounter for antineoplastic immunotherapy: Secondary | ICD-10-CM | POA: Diagnosis not present

## 2023-02-13 MED ORDER — SODIUM CHLORIDE 0.9 % IV SOLN: Freq: Once | INTRAVENOUS | Status: AC

## 2023-02-13 MED ORDER — SODIUM CHLORIDE 0.9% FLUSH
10.0000 mL | INTRAVENOUS | Status: DC | PRN
  Administered 2023-02-13: 10 mL

## 2023-02-13 MED ORDER — HEPARIN SOD (PORK) LOCK FLUSH 100 UNIT/ML IV SOLN
500.0000 [IU] | Freq: Once | INTRAVENOUS | Status: AC | PRN
  Administered 2023-02-13: 500 [IU]

## 2023-02-13 MED ORDER — SODIUM CHLORIDE 0.9 % IV SOLN
480.0000 mg | Freq: Once | INTRAVENOUS | Status: AC
  Administered 2023-02-13: 480 mg via INTRAVENOUS
  Filled 2023-02-13: qty 48

## 2023-02-13 NOTE — Patient Instructions (Signed)
[facility]  Discharge Instructions: Thank you for choosing [facility] to provide your oncology and hematology care.  If you have a lab appointment with the [facility], please go directly to the [facility] and check in at the registration area.   Wear comfortable clothing and clothing appropriate for easy access to any Portacath or PICC line.   We strive to give you quality time with your provider. You may need to reschedule your appointment if you arrive late (15 or more minutes).  Arriving late affects you and other patients whose appointments are after yours.  Also, if you miss three or more appointments without notifying the office, you may be dismissed from the clinic at the provider's discretion.      For prescription refill requests, have your pharmacy contact our office and allow 72 hours for refills to be completed.    Today you received the following chemotherapy and/or immunotherapy agents Opdivo      To help prevent nausea and vomiting after your treatment, we encourage you to take your nausea medication as directed.  BELOW ARE SYMPTOMS THAT SHOULD BE REPORTED IMMEDIATELY: *FEVER GREATER THAN 100.4 F (38 C) OR HIGHER *CHILLS OR SWEATING *NAUSEA AND VOMITING THAT IS NOT CONTROLLED WITH YOUR NAUSEA MEDICATION *UNUSUAL SHORTNESS OF BREATH *UNUSUAL BRUISING OR BLEEDING *URINARY PROBLEMS (pain or burning when urinating, or frequent urination) *BOWEL PROBLEMS (unusual diarrhea, constipation, pain near the anus) TENDERNESS IN MOUTH AND THROAT WITH OR WITHOUT PRESENCE OF ULCERS (sore throat, sores in mouth, or a toothache) UNUSUAL RASH, SWELLING OR PAIN  UNUSUAL VAGINAL DISCHARGE OR ITCHING   Items with * indicate a potential emergency and should be followed up as soon as possible or go to the Emergency Department if any problems should occur.  Please show the CHEMOTHERAPY ALERT CARD or IMMUNOTHERAPY ALERT CARD at check-in to the  Emergency Department and triage nurse.  Should you have questions after your visit or need to cancel or reschedule your appointment, please contact [facility]  Dept: [phone]  and follow the prompts.  Office hours are 8:00 a.m. to 4:30 p.m. Monday - Friday. Please note that voicemails left after 4:00 p.m. may not be returned until the following business day.  We are closed weekends and major holidays. You have access to a nurse at all times for urgent questions. Please call the main number to the clinic Dept: [phone] and follow the prompts.  For any non-urgent questions, you may also contact your provider using MyChart. We now offer e-Visits for anyone 46 and older to request care online for non-urgent symptoms. For details visit [email or web address].   Also download the MyChart app! Go to the app store, search "MyChart", open the app, select [facility], and log in with your MyChart username and password.

## 2023-02-17 DIAGNOSIS — H8109 Meniere's disease, unspecified ear: Secondary | ICD-10-CM | POA: Diagnosis not present

## 2023-02-17 DIAGNOSIS — H903 Sensorineural hearing loss, bilateral: Secondary | ICD-10-CM | POA: Diagnosis not present

## 2023-02-21 ENCOUNTER — Encounter: Payer: Self-pay | Admitting: Oncology

## 2023-02-27 DIAGNOSIS — H8109 Meniere's disease, unspecified ear: Secondary | ICD-10-CM | POA: Diagnosis not present

## 2023-03-04 DIAGNOSIS — E7800 Pure hypercholesterolemia, unspecified: Secondary | ICD-10-CM | POA: Diagnosis not present

## 2023-03-10 NOTE — Progress Notes (Unsigned)
[facility]  [address] [place],  [place]  [address] [phone]  Clinic Day:  03/11/2023  Referring physician: Mateo Flow, MD  HISTORY OF PRESENT ILLNESS:  The patient is a 72 y.o. female with metastatic renal cell carcinoma, proven per cytology from a right-sided thoracentesis in early December 2021.  She comes in today to be evaluated before heading into her 25th cycle of maintenance nivolumab. The patient tolerated her 24th cycle well without having any significant difficulties. Of note, this patient's maintenance nivolumab was preceded by 3 cycles of nivolumab/ipilimumab; a 4th cycle was not given due to her being hospitalized for severe colitis related to the ipilimumab component of her combination immunotherapy.  Since her last visit, the patient has been doing well.  Other than minimal shortness of breath, she denies having any new problems which concern her for disease progression.      PHYSICAL EXAM:  Blood pressure (!) 148/90, pulse 71, temperature 97.7 F (36.5 C), resp. rate 16, height 5\' 1"  (1.549 m), weight 149 lb 6.4 oz (67.8 kg), SpO2 94 %. Wt Readings from Last 3 Encounters:  03/11/23 149 lb 6.4 oz (67.8 kg)  02/10/23 149 lb 4.8 oz (67.7 kg)  01/16/23 147 lb (66.7 kg)   Body mass index is 28.23 kg/m.  Performance status (ECOG): 1 - Symptomatic but completely ambulatory  Physical Exam Vitals and nursing note reviewed.  Constitutional:      General: She is not in acute distress.    Appearance: Normal appearance.  HENT:     Head: Normocephalic and atraumatic.     Mouth/Throat:     Mouth: Mucous membranes are moist.     Pharynx: Oropharynx is clear. No oropharyngeal exudate or posterior oropharyngeal erythema.  Eyes:     General: No scleral icterus.    Extraocular Movements: Extraocular movements intact.     Conjunctiva/sclera: Conjunctivae normal.     Pupils: Pupils are equal, round, and reactive to light.   Cardiovascular:     Rate and Rhythm: Normal rate and regular rhythm.     Heart sounds: Normal heart sounds. No murmur heard.    No friction rub. No gallop.  Pulmonary:     Effort: Pulmonary effort is normal.     Breath sounds: Examination of the right-lower field reveals decreased breath sounds. Decreased breath sounds present. No wheezing, rhonchi or rales.  Chest:     Chest wall: No lacerations, swelling, tenderness or crepitus.  Abdominal:     General: There is no distension.     Palpations: Abdomen is soft. There is no hepatomegaly, splenomegaly or mass.     Tenderness: There is no abdominal tenderness.  Musculoskeletal:        General: Normal range of motion.     Cervical back: Normal range of motion and neck supple. No tenderness.     Right lower leg: No edema.     Left lower leg: No edema.  Lymphadenopathy:     Cervical: No cervical adenopathy.     Upper Body:     Right upper body: No supraclavicular or axillary adenopathy.     Left upper body: No supraclavicular or axillary adenopathy.     Lower Body: No right inguinal adenopathy. No left inguinal adenopathy.  Skin:    General: Skin is warm and dry.     Coloration: Skin is not jaundiced.     Findings: No ecchymosis or rash.  Neurological:     Mental Status: She  is alert and oriented to person, place, and time.     Cranial Nerves: No cranial nerve deficit.  Psychiatric:        Mood and Affect: Mood normal.        Behavior: Behavior normal.        Thought Content: Thought content normal.    LABS:    ASSESSMENT & PLAN:  Assessment/Plan:  72 y.o. female with metastatic renal cell carcinoma.  She will proceed with her 25th cycle of maintenance nivolumab this week.  Clinically, she appears to be doing well.  I will see her back in 4 weeks before she heads into her 26th cycle of maintenance nivolumab.  CT scans of her chest/abdomen/pelvis will be done a day before her next visit to ascertain her new disease baseline after  25 cycles of nivolumab.  The patient understands all the plans discussed today and is in agreement with them.     Santi Troung Macarthur Critchley, MD

## 2023-03-11 ENCOUNTER — Inpatient Hospital Stay: Payer: Medicare PPO

## 2023-03-11 ENCOUNTER — Inpatient Hospital Stay: Payer: Medicare PPO | Attending: Oncology | Admitting: Oncology

## 2023-03-11 ENCOUNTER — Other Ambulatory Visit: Payer: Self-pay | Admitting: Oncology

## 2023-03-11 ENCOUNTER — Telehealth: Payer: Self-pay | Admitting: Oncology

## 2023-03-11 DIAGNOSIS — D649 Anemia, unspecified: Secondary | ICD-10-CM | POA: Diagnosis not present

## 2023-03-11 DIAGNOSIS — Z5112 Encounter for antineoplastic immunotherapy: Secondary | ICD-10-CM | POA: Insufficient documentation

## 2023-03-11 DIAGNOSIS — D3001 Benign neoplasm of right kidney: Secondary | ICD-10-CM | POA: Diagnosis not present

## 2023-03-11 DIAGNOSIS — Z79620 Long term (current) use of immunosuppressive biologic: Secondary | ICD-10-CM | POA: Diagnosis not present

## 2023-03-11 DIAGNOSIS — C641 Malignant neoplasm of right kidney, except renal pelvis: Secondary | ICD-10-CM

## 2023-03-11 LAB — CMP (CANCER CENTER ONLY)
ALT: 31 U/L (ref 0–44)
AST: 32 U/L (ref 15–41)
Albumin: 4.6 g/dL (ref 3.5–5.0)
Alkaline Phosphatase: 64 U/L (ref 38–126)
Anion gap: 9 (ref 5–15)
BUN: 14 mg/dL (ref 8–23)
CO2: 26 mmol/L (ref 22–32)
Calcium: 8.7 mg/dL — ABNORMAL LOW (ref 8.9–10.3)
Chloride: 103 mmol/L (ref 98–111)
Creatinine: 0.88 mg/dL (ref 0.44–1.00)
GFR, Estimated: >60 mL/min (ref >60)
Glucose, Bld: 130 mg/dL — ABNORMAL HIGH (ref 70–99)
Potassium: 3.7 mmol/L (ref 3.5–5.1)
Sodium: 138 mmol/L (ref 135–145)
Total Bilirubin: 0.6 mg/dL (ref 0.3–1.2)
Total Protein: 7.4 g/dL (ref 6.5–8.1)

## 2023-03-11 LAB — CBC AND DIFFERENTIAL
HCT: 42 (ref 36–46)
Hemoglobin: 14.2 (ref 12.0–16.0)
Neutrophils Absolute: 3.02
Platelets: 158 10*3/uL (ref 150–400)
WBC: 5.2

## 2023-03-11 LAB — CBC: RBC: 4.35 (ref 3.87–5.11)

## 2023-03-11 LAB — TSH: TSH: 1.446 u[IU]/mL (ref 0.350–4.500)

## 2023-03-11 LAB — CBC W DIFFERENTIAL (~~LOC~~ CC SCANNED REPORT)

## 2023-03-11 NOTE — Telephone Encounter (Signed)
03/11/23 Next appt scheduled and confirmed with patient

## 2023-03-12 LAB — T4: T4, Total: 4.9 ug/dL (ref 4.5–12.0)

## 2023-03-12 MED FILL — Nivolumab IV Soln 240 MG/24ML: INTRAVENOUS | Qty: 48 | Status: AC

## 2023-03-13 ENCOUNTER — Inpatient Hospital Stay: Payer: Medicare PPO

## 2023-03-13 VITALS — BP 151/82 | HR 81 | Temp 97.5°F | Resp 16 | Ht 61.0 in | Wt 148.0 lb

## 2023-03-13 DIAGNOSIS — D3001 Benign neoplasm of right kidney: Secondary | ICD-10-CM

## 2023-03-13 DIAGNOSIS — Z79620 Long term (current) use of immunosuppressive biologic: Secondary | ICD-10-CM | POA: Diagnosis not present

## 2023-03-13 DIAGNOSIS — C641 Malignant neoplasm of right kidney, except renal pelvis: Secondary | ICD-10-CM | POA: Diagnosis not present

## 2023-03-13 DIAGNOSIS — Z5112 Encounter for antineoplastic immunotherapy: Secondary | ICD-10-CM | POA: Diagnosis not present

## 2023-03-13 MED ORDER — HEPARIN SOD (PORK) LOCK FLUSH 100 UNIT/ML IV SOLN
500.0000 [IU] | Freq: Once | INTRAVENOUS | Status: AC | PRN
  Administered 2023-03-13: 500 [IU]

## 2023-03-13 MED ORDER — SODIUM CHLORIDE 0.9 % IV SOLN
480.0000 mg | Freq: Once | INTRAVENOUS | Status: AC
  Administered 2023-03-13: 480 mg via INTRAVENOUS
  Filled 2023-03-13: qty 48

## 2023-03-13 MED ORDER — SODIUM CHLORIDE 0.9 % IV SOLN: Freq: Once | INTRAVENOUS | Status: AC

## 2023-03-13 MED ORDER — SODIUM CHLORIDE 0.9% FLUSH
10.0000 mL | INTRAVENOUS | Status: DC | PRN
  Administered 2023-03-13: 10 mL

## 2023-03-13 NOTE — Patient Instructions (Signed)
[facility]  Discharge Instructions: Thank you for choosing [facility] to provide your oncology and hematology care.  If you have a lab appointment with the Cancer Center, please go directly to the Cancer Center and check in at the registration area.   Wear comfortable clothing and clothing appropriate for easy access to any Portacath or PICC line.   We strive to give you quality time with your provider. You may need to reschedule your appointment if you arrive late (15 or more minutes).  Arriving late affects you and other patients whose appointments are after yours.  Also, if you miss three or more appointments without notifying the office, you may be dismissed from the clinic at the provider's discretion.      For prescription refill requests, have your pharmacy contact our office and allow 72 hours for refills to be completed.    Today you received the following chemotherapy and/or immunotherapy agents Opdivo      To help prevent nausea and vomiting after your treatment, we encourage you to take your nausea medication as directed.  BELOW ARE SYMPTOMS THAT SHOULD BE REPORTED IMMEDIATELY: *FEVER GREATER THAN 100.4 F (38 C) OR HIGHER *CHILLS OR SWEATING *NAUSEA AND VOMITING THAT IS NOT CONTROLLED WITH YOUR NAUSEA MEDICATION *UNUSUAL SHORTNESS OF BREATH *UNUSUAL BRUISING OR BLEEDING *URINARY PROBLEMS (pain or burning when urinating, or frequent urination) *BOWEL PROBLEMS (unusual diarrhea, constipation, pain near the anus) TENDERNESS IN MOUTH AND THROAT WITH OR WITHOUT PRESENCE OF ULCERS (sore throat, sores in mouth, or a toothache) UNUSUAL RASH, SWELLING OR PAIN  UNUSUAL VAGINAL DISCHARGE OR ITCHING   Items with * indicate a potential emergency and should be followed up as soon as possible or go to the Emergency Department if any problems should occur.  Please show the CHEMOTHERAPY ALERT CARD or IMMUNOTHERAPY ALERT CARD at check-in to the  Emergency Department and triage nurse.  Should you have questions after your visit or need to cancel or reschedule your appointment, please contact [facility]  Dept: [phone]  and follow the prompts.  Office hours are 8:00 a.m. to 4:30 p.m. Monday - Friday. Please note that voicemails left after 4:00 p.m. may not be returned until the following business day.  We are closed weekends and major holidays. You have access to a nurse at all times for urgent questions. Please call the main number to the clinic Dept: [phone] and follow the prompts.  For any non-urgent questions, you may also contact your provider using MyChart. We now offer e-Visits for anyone 18 and older to request care online for non-urgent symptoms. For details visit [email or web address].   Also download the MyChart app! Go to the app store, search "MyChart", open the app, select [facility], and log in with your MyChart username and password.   

## 2023-03-14 ENCOUNTER — Encounter: Payer: Self-pay | Admitting: Oncology

## 2023-03-14 DIAGNOSIS — L300 Nummular dermatitis: Secondary | ICD-10-CM | POA: Diagnosis not present

## 2023-03-14 DIAGNOSIS — C44212 Basal cell carcinoma of skin of right ear and external auricular canal: Secondary | ICD-10-CM | POA: Diagnosis not present

## 2023-03-17 ENCOUNTER — Telehealth: Payer: Self-pay | Admitting: Oncology

## 2023-03-17 NOTE — Telephone Encounter (Signed)
03/17/23 left msg about ct scans on 04/07/23 arrive at 10am.Appt at 11am

## 2023-03-24 DIAGNOSIS — Z6835 Body mass index (BMI) 35.0-35.9, adult: Secondary | ICD-10-CM | POA: Diagnosis not present

## 2023-03-24 DIAGNOSIS — E7800 Pure hypercholesterolemia, unspecified: Secondary | ICD-10-CM | POA: Diagnosis not present

## 2023-03-24 DIAGNOSIS — Z1331 Encounter for screening for depression: Secondary | ICD-10-CM | POA: Diagnosis not present

## 2023-03-24 DIAGNOSIS — C641 Malignant neoplasm of right kidney, except renal pelvis: Secondary | ICD-10-CM | POA: Diagnosis not present

## 2023-03-24 DIAGNOSIS — Z0000 Encounter for general adult medical examination without abnormal findings: Secondary | ICD-10-CM | POA: Diagnosis not present

## 2023-03-24 DIAGNOSIS — B353 Tinea pedis: Secondary | ICD-10-CM | POA: Diagnosis not present

## 2023-03-24 DIAGNOSIS — Z9181 History of falling: Secondary | ICD-10-CM | POA: Diagnosis not present

## 2023-03-25 ENCOUNTER — Other Ambulatory Visit: Payer: Self-pay

## 2023-04-07 DIAGNOSIS — C641 Malignant neoplasm of right kidney, except renal pelvis: Secondary | ICD-10-CM | POA: Diagnosis not present

## 2023-04-07 DIAGNOSIS — C679 Malignant neoplasm of bladder, unspecified: Secondary | ICD-10-CM | POA: Diagnosis not present

## 2023-04-07 LAB — HEPATIC FUNCTION PANEL
ALT: 38 U/L — AB (ref 7–35)
AST: 42 — AB (ref 13–35)
Alkaline Phosphatase: 71 (ref 25–125)
Bilirubin, Total: 0.5

## 2023-04-07 LAB — BASIC METABOLIC PANEL
BUN: 13 (ref 4–21)
CO2: 27 — AB (ref 13–22)
Chloride: 104 (ref 99–108)
Creatinine: 0.7 (ref 0.5–1.1)
EGFR: 60
Glucose: 124
Potassium: 3.9 mEq/L (ref 3.5–5.1)
Sodium: 141 (ref 137–147)

## 2023-04-07 LAB — COMPREHENSIVE METABOLIC PANEL
Albumin: 4.6 (ref 3.5–5.0)
Calcium: 9.1 (ref 8.7–10.7)

## 2023-04-07 NOTE — Progress Notes (Unsigned)
[facility]  [address] [place],  [place]  [address] [phone]  Clinic Day:  04/08/2023  Referring physician: Lise Auer, MD  HISTORY OF PRESENT ILLNESS:  The patient is a 72 y.o. female with metastatic renal cell carcinoma, proven per cytology from a right-sided thoracentesis in early December 2021.  She comes in today to go over her CT scans after receiving 25 cycles of maintenance nivolumab. The patient tolerated her 25th cycle well without having any significant difficulties. Of note, this patient's maintenance nivolumab was preceded by 3 cycles of nivolumab/ipilimumab; a 4th cycle was not given due to her being hospitalized for severe colitis related to the ipilimumab component of her combination immunotherapy.  Since her last visit, the patient has been doing well.  Other than minimal shortness of breath, she denies having any new problems which concern her for disease progression.      PHYSICAL EXAM:  Blood pressure (!) 196/80, pulse 65, temperature 98.4 F (36.9 C), resp. rate 16, height 5\' 1"  (1.549 m), weight 148 lb 4.8 oz (67.3 kg), SpO2 94 %. Wt Readings from Last 3 Encounters:  04/08/23 148 lb 4.8 oz (67.3 kg)  03/13/23 148 lb (67.1 kg)  03/11/23 149 lb 6.4 oz (67.8 kg)   Body mass index is 28.02 kg/m.  Performance status (ECOG): 1 - Symptomatic but completely ambulatory  Physical Exam Vitals and nursing note reviewed.  Constitutional:      General: She is not in acute distress.    Appearance: Normal appearance.  HENT:     Head: Normocephalic and atraumatic.     Mouth/Throat:     Mouth: Mucous membranes are moist.     Pharynx: Oropharynx is clear. No oropharyngeal exudate or posterior oropharyngeal erythema.  Eyes:     General: No scleral icterus.    Extraocular Movements: Extraocular movements intact.     Conjunctiva/sclera: Conjunctivae normal.     Pupils: Pupils are equal, round, and reactive to light.  Cardiovascular:      Rate and Rhythm: Normal rate and regular rhythm.     Heart sounds: Normal heart sounds. No murmur heard.    No friction rub. No gallop.  Pulmonary:     Effort: Pulmonary effort is normal.     Breath sounds: Examination of the right-lower field reveals decreased breath sounds. Decreased breath sounds present. No wheezing, rhonchi or rales.  Chest:     Chest wall: No lacerations, swelling, tenderness or crepitus.  Abdominal:     General: There is no distension.     Palpations: Abdomen is soft. There is no hepatomegaly, splenomegaly or mass.     Tenderness: There is no abdominal tenderness.  Musculoskeletal:        General: Normal range of motion.     Cervical back: Normal range of motion and neck supple. No tenderness.     Right lower leg: No edema.     Left lower leg: No edema.  Lymphadenopathy:     Cervical: No cervical adenopathy.     Upper Body:     Right upper body: No supraclavicular or axillary adenopathy.     Left upper body: No supraclavicular or axillary adenopathy.     Lower Body: No right inguinal adenopathy. No left inguinal adenopathy.  Skin:    General: Skin is warm and dry.     Coloration: Skin is not jaundiced.     Findings: No ecchymosis or rash.  Neurological:     Mental Status:  She is alert and oriented to person, place, and time.     Cranial Nerves: No cranial nerve deficit.  Psychiatric:        Mood and Affect: Mood normal.        Behavior: Behavior normal.        Thought Content: Thought content normal.   SCANS:  CT scans of her chest/abdomen/pelvis revealed the following: FINDINGS: CT CHEST FINDINGS  Cardiovascular: The heart is normal in size. No pericardial effusion. Stable aortic and coronary artery calcifications. Stable right IJ Port-A-Cath. The aorta is normal in caliber. Stable atherosclerotic calcifications. Stable calcifications around the aortic valve. Stable coronary artery calcifications.  Mediastinum/Nodes: No mediastinal or hilar  mass or lymphadenopathy. The esophagus is grossly normal.  Lungs/Pleura: Stable large right pleural effusion with overlying atelectasis. Stable underlying emphysematous changes and pulmonary scarring. No new pulmonary nodules. Stable dense scarring changes at both lung bases.  Musculoskeletal: No breast masses, supraclavicular or axillary adenopathy. The bony thorax is intact.  CT ABDOMEN PELVIS FINDINGS  Hepatobiliary: Stable marked exophytic nodularity of the right hepatic lobe. No definite hepatic lesions or intrahepatic biliary dilatation. Gallbladder is contracted. No common bile duct dilatation.  Pancreas: No mass, inflammation or ductal dilatation.  Spleen: Normal size. No focal lesions.  Adrenals/Urinary Tract: The adrenal glands and left kidney are unremarkable and stable. The right kidney demonstrates a 4.2 x 3.1 cm lower pole mass consistent with known renal cell carcinoma. This previously measured 3.7 x 3.2 cm. The bladder is unremarkable.  Stomach/Bowel: The stomach, duodenum, small bowel and colon are grossly normal. No acute inflammatory process, mass lesions or obstructive findings. Moderate sigmoid colon diverticulosis.  Vascular/Lymphatic: Stable age advanced atherosclerotic calcification involving the abdominal aorta and iliac arteries but no aneurysm. No mesenteric or retroperitoneal adenopathy.  Reproductive: The uterus and ovaries are unremarkable.  Other: No pelvic mass or adenopathy. No free pelvic fluid collections. No inguinal mass or adenopathy. No abdominal wall hernia or subcutaneous lesions.  Musculoskeletal: No significant bony findings.  IMPRESSION: 1. Slight interval enlargement of the right lower pole renal mass consistent with known renal cell carcinoma. 2. No findings for metastatic disease involving the chest, abdomen or pelvis. 3. Stable large right pleural effusion with overlying atelectasis. 4. Stable marked exophytic nodularity  of the right hepatic lobe.  Aortic Atherosclerosis (ICD10-I70.0) and Emphysema (ICD10-J43.9).  LABS:    ASSESSMENT & PLAN:  Assessment/Plan:  72 y.o. female with metastatic renal cell carcinoma.  In clinic today, I went over all of her CT scan images with her, for which there has been no obvious evidence of disease progression.  As her right renal mass has only minimally increased from radiology's standpoint, I do not believe this warrants a change in treatment.  Clinically, the patient continues to do well.  She will proceed with her 26th cycle of maintenance nivolumab immunotherapy later this week.  I will see her back in 4 weeks before she heads into her 27th cycle of maintenance nivolumab.  The patient understands all the plans discussed today and is in agreement with them.     Harmoney Sienkiewicz Kirby Funk, MD

## 2023-04-08 ENCOUNTER — Inpatient Hospital Stay: Payer: Medicare PPO | Attending: Oncology | Admitting: Oncology

## 2023-04-08 DIAGNOSIS — D3001 Benign neoplasm of right kidney: Secondary | ICD-10-CM | POA: Diagnosis not present

## 2023-04-08 DIAGNOSIS — C641 Malignant neoplasm of right kidney, except renal pelvis: Secondary | ICD-10-CM | POA: Insufficient documentation

## 2023-04-08 DIAGNOSIS — Z5112 Encounter for antineoplastic immunotherapy: Secondary | ICD-10-CM | POA: Insufficient documentation

## 2023-04-09 ENCOUNTER — Other Ambulatory Visit: Payer: Self-pay

## 2023-04-09 ENCOUNTER — Encounter: Payer: Self-pay | Admitting: Oncology

## 2023-04-09 ENCOUNTER — Other Ambulatory Visit: Payer: Self-pay | Admitting: Oncology

## 2023-04-09 DIAGNOSIS — D3001 Benign neoplasm of right kidney: Secondary | ICD-10-CM

## 2023-04-09 MED FILL — Nivolumab IV Soln 240 MG/24ML: INTRAVENOUS | Qty: 48 | Status: AC

## 2023-04-10 ENCOUNTER — Inpatient Hospital Stay: Payer: Medicare PPO

## 2023-04-10 ENCOUNTER — Other Ambulatory Visit: Payer: Self-pay

## 2023-04-10 ENCOUNTER — Encounter: Payer: Self-pay | Admitting: Oncology

## 2023-04-10 VITALS — BP 162/90 | HR 74 | Temp 97.5°F | Resp 16 | Wt 147.1 lb

## 2023-04-10 DIAGNOSIS — D3001 Benign neoplasm of right kidney: Secondary | ICD-10-CM

## 2023-04-10 DIAGNOSIS — Z5112 Encounter for antineoplastic immunotherapy: Secondary | ICD-10-CM | POA: Diagnosis present

## 2023-04-10 DIAGNOSIS — C641 Malignant neoplasm of right kidney, except renal pelvis: Secondary | ICD-10-CM | POA: Diagnosis not present

## 2023-04-10 LAB — CBC WITH DIFFERENTIAL (CANCER CENTER ONLY)
Abs Immature Granulocytes: 0.02 10*3/uL (ref 0.00–0.07)
Basophils Absolute: 0.1 10*3/uL (ref 0.0–0.1)
Basophils Relative: 1 %
Eosinophils Absolute: 0.3 10*3/uL (ref 0.0–0.5)
Eosinophils Relative: 5 %
HCT: 42.4 % (ref 36.0–46.0)
Hemoglobin: 13.7 g/dL (ref 12.0–15.0)
Immature Granulocytes: 0 %
Lymphocytes Relative: 24 %
Lymphs Abs: 1.4 10*3/uL (ref 0.7–4.0)
MCH: 32.2 pg (ref 26.0–34.0)
MCHC: 32.3 g/dL (ref 30.0–36.0)
MCV: 99.8 fL (ref 80.0–100.0)
Monocytes Absolute: 0.5 10*3/uL (ref 0.1–1.0)
Monocytes Relative: 8 %
Neutro Abs: 3.7 10*3/uL (ref 1.7–7.7)
Neutrophils Relative %: 62 %
Platelet Count: 184 10*3/uL (ref 150–400)
RBC: 4.25 MIL/uL (ref 3.87–5.11)
RDW: 13.6 % (ref 11.5–15.5)
WBC Count: 6 10*3/uL (ref 4.0–10.5)
nRBC: 0 % (ref 0.0–0.2)

## 2023-04-10 MED ORDER — SODIUM CHLORIDE 0.9 % IV SOLN
480.0000 mg | Freq: Once | INTRAVENOUS | Status: AC
  Administered 2023-04-10: 480 mg via INTRAVENOUS
  Filled 2023-04-10: qty 48

## 2023-04-10 MED ORDER — HEPARIN SOD (PORK) LOCK FLUSH 100 UNIT/ML IV SOLN
500.0000 [IU] | Freq: Once | INTRAVENOUS | Status: AC | PRN
  Administered 2023-04-10: 500 [IU]

## 2023-04-10 MED ORDER — SODIUM CHLORIDE 0.9 % IV SOLN: Freq: Once | INTRAVENOUS | Status: AC

## 2023-04-10 MED ORDER — SODIUM CHLORIDE 0.9% FLUSH
10.0000 mL | INTRAVENOUS | Status: DC | PRN
  Administered 2023-04-10: 10 mL

## 2023-04-10 NOTE — Progress Notes (Signed)
CBC drawn from port

## 2023-04-10 NOTE — Telephone Encounter (Signed)
DONE

## 2023-04-10 NOTE — Patient Instructions (Signed)
Nivolumab Injection What is this medication? NIVOLUMAB (nye VOL ue mab) treats some types of cancer. It works by helping your immune system slow or stop the spread of cancer cells. It is a monoclonal antibody. This medicine may be used for other purposes; ask your health care provider or pharmacist if you have questions. COMMON BRAND NAME(S): Opdivo What should I tell my care team before I take this medication? They need to know if you have any of these conditions: Allogeneic stem cell transplant (uses someone else's stem cells) Autoimmune diseases, such as Crohn disease, ulcerative colitis, lupus History of chest radiation Nervous system problems, such as Guillain-Barre syndrome or myasthenia gravis Organ transplant An unusual or allergic reaction to nivolumab, other medications, foods, dyes, or preservatives Pregnant or trying to get pregnant Breast-feeding How should I use this medication? This medication is infused into a vein. It is given in a hospital or clinic setting. A special MedGuide will be given to you before each treatment. Be sure to read this information carefully each time. Talk to your care team about the use of this medication in children. While it may be prescribed for children as young as 12 years for selected conditions, precautions do apply. Overdosage: If you think you have taken too much of this medicine contact a poison control center or emergency room at once. NOTE: This medicine is only for you. Do not share this medicine with others. What if I miss a dose? Keep appointments for follow-up doses. It is important not to miss your dose. Call your care team if you are unable to keep an appointment. What may interact with this medication? Interactions have not been studied. This list may not describe all possible interactions. Give your health care provider a list of all the medicines, herbs, non-prescription drugs, or dietary supplements you use. Also tell them if you  smoke, drink alcohol, or use illegal drugs. Some items may interact with your medicine. What should I watch for while using this medication? Your condition will be monitored carefully while you are receiving this medication. You may need blood work while taking this medication. This medication may cause serious skin reactions. They can happen weeks to months after starting the medication. Contact your care team right away if you notice fevers or flu-like symptoms with a rash. The rash may be red or purple and then turn into blisters or peeling of the skin. You may also notice a red rash with swelling of the face, lips, or lymph nodes in your neck or under your arms. Tell your care team right away if you have any change in your eyesight. Talk to your care team if you are pregnant or think you might be pregnant. A negative pregnancy test is required before starting this medication. A reliable form of contraception is recommended while taking this medication and for 5 months after the last dose. Talk to your care team about effective forms of contraception. Do not breast-feed while taking this medication and for 5 months after the last dose. What side effects may I notice from receiving this medication? Side effects that you should report to your care team as soon as possible: Allergic reactions--skin rash, itching, hives, swelling of the face, lips, tongue, or throat Dry cough, shortness of breath or trouble breathing Eye pain, redness, irritation, or discharge with blurry or decreased vision Heart muscle inflammation--unusual weakness or fatigue, shortness of breath, chest pain, fast or irregular heartbeat, dizziness, swelling of the ankles, feet, or hands Hormone   gland problems--headache, sensitivity to light, unusual weakness or fatigue, dizziness, fast or irregular heartbeat, increased sensitivity to cold or heat, excessive sweating, constipation, hair loss, increased thirst or amount of urine,  tremors or shaking, irritability Infusion reactions--chest pain, shortness of breath or trouble breathing, feeling faint or lightheaded Kidney injury (glomerulonephritis)--decrease in the amount of urine, red or dark brown urine, foamy or bubbly urine, swelling of the ankles, hands, or feet Liver injury--right upper belly pain, loss of appetite, nausea, light-colored stool, dark yellow or brown urine, yellowing skin or eyes, unusual weakness or fatigue Pain, tingling, or numbness in the hands or feet, muscle weakness, change in vision, confusion or trouble speaking, loss of balance or coordination, trouble walking, seizures Rash, fever, and swollen lymph nodes Redness, blistering, peeling, or loosening of the skin, including inside the mouth Sudden or severe stomach pain, bloody diarrhea, fever, nausea, vomiting Side effects that usually do not require medical attention (report these to your care team if they continue or are bothersome): Bone, joint, or muscle pain Diarrhea Fatigue Loss of appetite Nausea Skin rash This list may not describe all possible side effects. Call your doctor for medical advice about side effects. You may report side effects to FDA at 1-800-FDA-1088. Where should I keep my medication? This medication is given in a hospital or clinic. It will not be stored at home. NOTE: This sheet is a summary. It may not cover all possible information. If you have questions about this medicine, talk to your doctor, pharmacist, or health care provider.  2023 Elsevier/Gold Standard (2014-10-31 00:00:00)  

## 2023-04-15 ENCOUNTER — Encounter: Payer: Self-pay | Admitting: Oncology

## 2023-04-22 DIAGNOSIS — R92323 Mammographic fibroglandular density, bilateral breasts: Secondary | ICD-10-CM | POA: Diagnosis not present

## 2023-04-22 DIAGNOSIS — Z1231 Encounter for screening mammogram for malignant neoplasm of breast: Secondary | ICD-10-CM | POA: Diagnosis not present

## 2023-04-22 LAB — HM MAMMOGRAPHY

## 2023-05-05 NOTE — Progress Notes (Signed)
[facility]  [address] [place],  [place]  [address] [phone]  Clinic Day:  05/06/2023  Referring physician: Lise Auer, MD  HISTORY OF PRESENT ILLNESS:  The patient is a 72 y.o. female with metastatic renal cell carcinoma, proven per cytology from a right-sided thoracentesis in early December 2021.  She comes in today to be evaluated before heading into her 27th cycle of maintenance nivolumab. The patient tolerated her 26th cycle well without having any significant difficulties. Of note, this patient's maintenance nivolumab was preceded by 3 cycles of nivolumab/ipilimumab; a 4th cycle was not given due to her being hospitalized for severe colitis related to the ipilimumab component of her combination immunotherapy.  Since her last visit, the patient has been doing well.  Other than minimal shortness of breath, she denies having any new problems which concern her for disease progression.      PHYSICAL EXAM:  Blood pressure (!) 156/71, pulse 78, temperature 97.6 F (36.4 C), temperature source Oral, resp. rate 18, height 5\' 1"  (1.549 m), weight 148 lb 1.6 oz (67.2 kg), SpO2 92 %. Wt Readings from Last 3 Encounters:  05/06/23 148 lb 1.6 oz (67.2 kg)  04/10/23 147 lb 1.9 oz (66.7 kg)  04/08/23 148 lb 4.8 oz (67.3 kg)   Body mass index is 27.98 kg/m.  Performance status (ECOG): 1 - Symptomatic but completely ambulatory  Physical Exam Vitals and nursing note reviewed.  Constitutional:      General: She is not in acute distress.    Appearance: Normal appearance.  HENT:     Head: Normocephalic and atraumatic.     Mouth/Throat:     Mouth: Mucous membranes are moist.     Pharynx: Oropharynx is clear. No oropharyngeal exudate or posterior oropharyngeal erythema.  Eyes:     General: No scleral icterus.    Extraocular Movements: Extraocular movements intact.     Conjunctiva/sclera: Conjunctivae normal.     Pupils: Pupils are equal, round, and  reactive to light.  Cardiovascular:     Rate and Rhythm: Normal rate and regular rhythm.     Heart sounds: Normal heart sounds. No murmur heard.    No friction rub. No gallop.  Pulmonary:     Effort: Pulmonary effort is normal.     Breath sounds: Examination of the right-lower field reveals decreased breath sounds. Decreased breath sounds present. No wheezing, rhonchi or rales.  Chest:     Chest wall: No lacerations, swelling, tenderness or crepitus.  Abdominal:     General: There is no distension.     Palpations: Abdomen is soft. There is no hepatomegaly, splenomegaly or mass.     Tenderness: There is no abdominal tenderness.  Musculoskeletal:        General: Normal range of motion.     Cervical back: Normal range of motion and neck supple. No tenderness.     Right lower leg: No edema.     Left lower leg: No edema.  Lymphadenopathy:     Cervical: No cervical adenopathy.     Upper Body:     Right upper body: No supraclavicular or axillary adenopathy.     Left upper body: No supraclavicular or axillary adenopathy.     Lower Body: No right inguinal adenopathy. No left inguinal adenopathy.  Skin:    General: Skin is warm and dry.     Coloration: Skin is not jaundiced.     Findings: No ecchymosis or rash.  Neurological:  Mental Status: She is alert and oriented to person, place, and time.     Cranial Nerves: No cranial nerve deficit.  Psychiatric:        Mood and Affect: Mood normal.        Behavior: Behavior normal.        Thought Content: Thought content normal.    LABS:     Latest Reference Range & Units 05/06/23 10:04  Sodium 135 - 145 mmol/L 135  Potassium 3.5 - 5.1 mmol/L 3.8  Chloride 98 - 111 mmol/L 107  CO2 22 - 32 mmol/L 25  Glucose 70 - 99 mg/dL 161 (H)  BUN 8 - 23 mg/dL 13  Creatinine 0.96 - 0.45 mg/dL 4.09  Calcium 8.9 - 81.1 mg/dL 8.5 (L)  Anion gap 5 - 15  3 (L)  Alkaline Phosphatase 38 - 126 U/L 72  Albumin 3.5 - 5.0 g/dL 4.6  AST 15 - 41 U/L 38   ALT 0 - 44 U/L 34  Total Protein 6.5 - 8.1 g/dL 7.5  Total Bilirubin 0.3 - 1.2 mg/dL 0.7  GFR, Est Non African American >60 mL/min >60  (H): Data is abnormally high (L): Data is abnormally low  ASSESSMENT & PLAN:  Assessment/Plan:  72 y.o. female with metastatic renal cell carcinoma.  She will proceed with her 27th cycle of maintenance nivolumab immunotherapy later this week.  Clinically, the patient continues to do well.  I will see her back in 4 weeks before she heads into her 28th cycle of maintenance nivolumab.  The patient understands all the plans discussed today and is in agreement with them.    Samay Delcarlo Kirby Funk, MD

## 2023-05-06 ENCOUNTER — Encounter: Payer: Self-pay | Admitting: Oncology

## 2023-05-06 ENCOUNTER — Inpatient Hospital Stay: Payer: Medicare PPO | Attending: Oncology | Admitting: Oncology

## 2023-05-06 ENCOUNTER — Inpatient Hospital Stay: Payer: Medicare PPO

## 2023-05-06 DIAGNOSIS — D3001 Benign neoplasm of right kidney: Secondary | ICD-10-CM

## 2023-05-06 DIAGNOSIS — C649 Malignant neoplasm of unspecified kidney, except renal pelvis: Secondary | ICD-10-CM | POA: Diagnosis not present

## 2023-05-06 DIAGNOSIS — Z79620 Long term (current) use of immunosuppressive biologic: Secondary | ICD-10-CM | POA: Insufficient documentation

## 2023-05-06 DIAGNOSIS — Z5112 Encounter for antineoplastic immunotherapy: Secondary | ICD-10-CM | POA: Insufficient documentation

## 2023-05-06 DIAGNOSIS — C641 Malignant neoplasm of right kidney, except renal pelvis: Secondary | ICD-10-CM | POA: Insufficient documentation

## 2023-05-06 DIAGNOSIS — D649 Anemia, unspecified: Secondary | ICD-10-CM | POA: Diagnosis not present

## 2023-05-06 LAB — CMP (CANCER CENTER ONLY)
ALT: 34 U/L (ref 0–44)
AST: 38 U/L (ref 15–41)
Albumin: 4.6 g/dL (ref 3.5–5.0)
Alkaline Phosphatase: 72 U/L (ref 38–126)
Anion gap: 3 — ABNORMAL LOW (ref 5–15)
BUN: 13 mg/dL (ref 8–23)
CO2: 25 mmol/L (ref 22–32)
Calcium: 8.5 mg/dL — ABNORMAL LOW (ref 8.9–10.3)
Chloride: 107 mmol/L (ref 98–111)
Creatinine: 0.82 mg/dL (ref 0.44–1.00)
GFR, Estimated: >60 mL/min (ref >60)
Glucose, Bld: 108 mg/dL — ABNORMAL HIGH (ref 70–99)
Potassium: 3.8 mmol/L (ref 3.5–5.1)
Sodium: 135 mmol/L (ref 135–145)
Total Bilirubin: 0.7 mg/dL (ref 0.3–1.2)
Total Protein: 7.5 g/dL (ref 6.5–8.1)

## 2023-05-06 LAB — CBC AND DIFFERENTIAL
HCT: 42 (ref 36–46)
Hemoglobin: 14.2 (ref 12.0–16.0)
Neutrophils Absolute: 3.52
Platelets: 183 10*3/uL (ref 150–400)
WBC: 6.4

## 2023-05-06 LAB — CBC W DIFFERENTIAL (~~LOC~~ CC SCANNED REPORT)

## 2023-05-06 LAB — TSH: TSH: 1.976 u[IU]/mL (ref 0.350–4.500)

## 2023-05-06 LAB — CBC: RBC: 4.34 (ref 3.87–5.11)

## 2023-05-07 ENCOUNTER — Other Ambulatory Visit: Payer: Self-pay

## 2023-05-07 LAB — T4: T4, Total: 5.7 ug/dL (ref 4.5–12.0)

## 2023-05-07 MED FILL — Nivolumab IV Soln 240 MG/24ML: INTRAVENOUS | Qty: 48 | Status: AC

## 2023-05-08 ENCOUNTER — Inpatient Hospital Stay: Payer: Medicare PPO

## 2023-05-08 VITALS — BP 149/66 | HR 74 | Temp 97.5°F | Resp 18 | Ht 61.0 in | Wt 147.0 lb

## 2023-05-08 DIAGNOSIS — Z79620 Long term (current) use of immunosuppressive biologic: Secondary | ICD-10-CM | POA: Diagnosis not present

## 2023-05-08 DIAGNOSIS — D3001 Benign neoplasm of right kidney: Secondary | ICD-10-CM

## 2023-05-08 DIAGNOSIS — C641 Malignant neoplasm of right kidney, except renal pelvis: Secondary | ICD-10-CM | POA: Diagnosis not present

## 2023-05-08 DIAGNOSIS — Z5112 Encounter for antineoplastic immunotherapy: Secondary | ICD-10-CM | POA: Diagnosis not present

## 2023-05-08 MED ORDER — SODIUM CHLORIDE 0.9 % IV SOLN: Freq: Once | INTRAVENOUS | Status: AC

## 2023-05-08 MED ORDER — SODIUM CHLORIDE 0.9% FLUSH
10.0000 mL | INTRAVENOUS | Status: DC | PRN
  Administered 2023-05-08: 10 mL

## 2023-05-08 MED ORDER — SODIUM CHLORIDE 0.9 % IV SOLN
480.0000 mg | Freq: Once | INTRAVENOUS | Status: AC
  Administered 2023-05-08: 480 mg via INTRAVENOUS
  Filled 2023-05-08: qty 48

## 2023-05-08 MED ORDER — HEPARIN SOD (PORK) LOCK FLUSH 100 UNIT/ML IV SOLN
500.0000 [IU] | Freq: Once | INTRAVENOUS | Status: AC | PRN
  Administered 2023-05-08: 500 [IU]

## 2023-05-08 NOTE — Patient Instructions (Signed)
Nivolumab Injection What is this medication? NIVOLUMAB (nye VOL ue mab) treats some types of cancer. It works by helping your immune system slow or stop the spread of cancer cells. It is a monoclonal antibody. This medicine may be used for other purposes; ask your health care provider or pharmacist if you have questions. COMMON BRAND NAME(S): Opdivo What should I tell my care team before I take this medication? They need to know if you have any of these conditions: Allogeneic stem cell transplant (uses someone else's stem cells) Autoimmune diseases, such as Crohn disease, ulcerative colitis, lupus History of chest radiation Nervous system problems, such as Guillain-Barre syndrome or myasthenia gravis Organ transplant An unusual or allergic reaction to nivolumab, other medications, foods, dyes, or preservatives Pregnant or trying to get pregnant Breast-feeding How should I use this medication? This medication is infused into a vein. It is given in a hospital or clinic setting. A special MedGuide will be given to you before each treatment. Be sure to read this information carefully each time. Talk to your care team about the use of this medication in children. While it may be prescribed for children as young as 12 years for selected conditions, precautions do apply. Overdosage: If you think you have taken too much of this medicine contact a poison control center or emergency room at once. NOTE: This medicine is only for you. Do not share this medicine with others. What if I miss a dose? Keep appointments for follow-up doses. It is important not to miss your dose. Call your care team if you are unable to keep an appointment. What may interact with this medication? Interactions have not been studied. This list may not describe all possible interactions. Give your health care provider a list of all the medicines, herbs, non-prescription drugs, or dietary supplements you use. Also tell them if you  smoke, drink alcohol, or use illegal drugs. Some items may interact with your medicine. What should I watch for while using this medication? Your condition will be monitored carefully while you are receiving this medication. You may need blood work while taking this medication. This medication may cause serious skin reactions. They can happen weeks to months after starting the medication. Contact your care team right away if you notice fevers or flu-like symptoms with a rash. The rash may be red or purple and then turn into blisters or peeling of the skin. You may also notice a red rash with swelling of the face, lips, or lymph nodes in your neck or under your arms. Tell your care team right away if you have any change in your eyesight. Talk to your care team if you are pregnant or think you might be pregnant. A negative pregnancy test is required before starting this medication. A reliable form of contraception is recommended while taking this medication and for 5 months after the last dose. Talk to your care team about effective forms of contraception. Do not breast-feed while taking this medication and for 5 months after the last dose. What side effects may I notice from receiving this medication? Side effects that you should report to your care team as soon as possible: Allergic reactions--skin rash, itching, hives, swelling of the face, lips, tongue, or throat Dry cough, shortness of breath or trouble breathing Eye pain, redness, irritation, or discharge with blurry or decreased vision Heart muscle inflammation--unusual weakness or fatigue, shortness of breath, chest pain, fast or irregular heartbeat, dizziness, swelling of the ankles, feet, or hands Hormone   gland problems--headache, sensitivity to light, unusual weakness or fatigue, dizziness, fast or irregular heartbeat, increased sensitivity to cold or heat, excessive sweating, constipation, hair loss, increased thirst or amount of urine,  tremors or shaking, irritability Infusion reactions--chest pain, shortness of breath or trouble breathing, feeling faint or lightheaded Kidney injury (glomerulonephritis)--decrease in the amount of urine, red or dark brown urine, foamy or bubbly urine, swelling of the ankles, hands, or feet Liver injury--right upper belly pain, loss of appetite, nausea, light-colored stool, dark yellow or brown urine, yellowing skin or eyes, unusual weakness or fatigue Pain, tingling, or numbness in the hands or feet, muscle weakness, change in vision, confusion or trouble speaking, loss of balance or coordination, trouble walking, seizures Rash, fever, and swollen lymph nodes Redness, blistering, peeling, or loosening of the skin, including inside the mouth Sudden or severe stomach pain, bloody diarrhea, fever, nausea, vomiting Side effects that usually do not require medical attention (report these to your care team if they continue or are bothersome): Bone, joint, or muscle pain Diarrhea Fatigue Loss of appetite Nausea Skin rash This list may not describe all possible side effects. Call your doctor for medical advice about side effects. You may report side effects to FDA at 1-800-FDA-1088. Where should I keep my medication? This medication is given in a hospital or clinic. It will not be stored at home. NOTE: This sheet is a summary. It may not cover all possible information. If you have questions about this medicine, talk to your doctor, pharmacist, or health care provider.  2023 Elsevier/Gold Standard (2014-10-31 00:00:00)  

## 2023-06-02 NOTE — Progress Notes (Unsigned)
[facility]  [address] [place],  [place]  [address] [phone]  Clinic Day:  06/03/2023  Referring physician: Lise Auer, MD  HISTORY OF PRESENT ILLNESS:  The patient is a 72 y.o. female with metastatic renal cell carcinoma, proven per cytology from a right-sided thoracentesis in early December 2021.  She comes in today to be evaluated before heading into her 28th cycle of maintenance nivolumab. The patient tolerated her 27th cycle well without having any significant difficulties. Of note, this patient's maintenance nivolumab was preceded by 3 cycles of nivolumab/ipilimumab; a 4th cycle was not given due to her being hospitalized for severe colitis related to the ipilimumab component of her combination immunotherapy.  Since her last visit, the patient has been doing well.  Other than minimal shortness of breath, she denies having any new problems which concern her for disease progression.      PHYSICAL EXAM:  Blood pressure (!) 173/77, pulse 67, temperature 98.3 F (36.8 C), resp. rate 16, height 5\' 1"  (1.549 m), weight 148 lb 1.6 oz (67.2 kg), SpO2 92 %. Wt Readings from Last 3 Encounters:  06/03/23 148 lb 1.6 oz (67.2 kg)  05/08/23 147 lb (66.7 kg)  05/06/23 148 lb 1.6 oz (67.2 kg)   Body mass index is 27.98 kg/m.  Performance status (ECOG): 1 - Symptomatic but completely ambulatory  Physical Exam Vitals and nursing note reviewed.  Constitutional:      General: She is not in acute distress.    Appearance: Normal appearance.  HENT:     Head: Normocephalic and atraumatic.     Mouth/Throat:     Mouth: Mucous membranes are moist.     Pharynx: Oropharynx is clear. No oropharyngeal exudate or posterior oropharyngeal erythema.  Eyes:     General: No scleral icterus.    Extraocular Movements: Extraocular movements intact.     Conjunctiva/sclera: Conjunctivae normal.     Pupils: Pupils are equal, round, and reactive to light.   Cardiovascular:     Rate and Rhythm: Normal rate and regular rhythm.     Heart sounds: Normal heart sounds. No murmur heard.    No friction rub. No gallop.  Pulmonary:     Effort: Pulmonary effort is normal.     Breath sounds: Examination of the right-lower field reveals decreased breath sounds. Decreased breath sounds present. No wheezing, rhonchi or rales.  Chest:     Chest wall: No lacerations, swelling, tenderness or crepitus.  Abdominal:     General: There is no distension.     Palpations: Abdomen is soft. There is no hepatomegaly, splenomegaly or mass.     Tenderness: There is no abdominal tenderness.  Musculoskeletal:        General: Normal range of motion.     Cervical back: Normal range of motion and neck supple. No tenderness.     Right lower leg: No edema.     Left lower leg: No edema.  Lymphadenopathy:     Cervical: No cervical adenopathy.     Upper Body:     Right upper body: No supraclavicular or axillary adenopathy.     Left upper body: No supraclavicular or axillary adenopathy.     Lower Body: No right inguinal adenopathy. No left inguinal adenopathy.  Skin:    General: Skin is warm and dry.     Coloration: Skin is not jaundiced.     Findings: No ecchymosis or rash.  Neurological:     Mental Status: She  is alert and oriented to person, place, and time.     Cranial Nerves: No cranial nerve deficit.  Psychiatric:        Mood and Affect: Mood normal.        Behavior: Behavior normal.        Thought Content: Thought content normal.    LABS:     ASSESSMENT & PLAN:  Assessment/Plan:  72 y.o. female with metastatic renal cell carcinoma.  She will proceed with her 28th cycle of maintenance nivolumab immunotherapy later this week.  Clinically, the patient continues to do well.  I will see her back in 4 weeks before she heads into her 29th cycle of maintenance nivolumab.  The patient understands all the plans discussed today and is in agreement with them.     Bryahna Lesko Kirby Funk, MD

## 2023-06-03 ENCOUNTER — Inpatient Hospital Stay: Payer: Medicare PPO | Attending: Oncology | Admitting: Oncology

## 2023-06-03 ENCOUNTER — Inpatient Hospital Stay: Payer: Medicare PPO

## 2023-06-03 ENCOUNTER — Other Ambulatory Visit: Payer: Self-pay | Admitting: Oncology

## 2023-06-03 DIAGNOSIS — C649 Malignant neoplasm of unspecified kidney, except renal pelvis: Secondary | ICD-10-CM | POA: Insufficient documentation

## 2023-06-03 DIAGNOSIS — D649 Anemia, unspecified: Secondary | ICD-10-CM | POA: Diagnosis not present

## 2023-06-03 DIAGNOSIS — D3001 Benign neoplasm of right kidney: Secondary | ICD-10-CM

## 2023-06-03 DIAGNOSIS — C641 Malignant neoplasm of right kidney, except renal pelvis: Secondary | ICD-10-CM

## 2023-06-03 DIAGNOSIS — Z79620 Long term (current) use of immunosuppressive biologic: Secondary | ICD-10-CM | POA: Diagnosis not present

## 2023-06-03 DIAGNOSIS — Z5112 Encounter for antineoplastic immunotherapy: Secondary | ICD-10-CM | POA: Insufficient documentation

## 2023-06-03 LAB — CMP (CANCER CENTER ONLY)
ALT: 28 U/L (ref 0–44)
AST: 26 U/L (ref 15–41)
Albumin: 4.4 g/dL (ref 3.5–5.0)
Alkaline Phosphatase: 53 U/L (ref 38–126)
Anion gap: 9 (ref 5–15)
BUN: 11 mg/dL (ref 8–23)
CO2: 25 mmol/L (ref 22–32)
Calcium: 9 mg/dL (ref 8.9–10.3)
Chloride: 106 mmol/L (ref 98–111)
Creatinine: 0.81 mg/dL (ref 0.44–1.00)
GFR, Estimated: >60 mL/min (ref >60)
Glucose, Bld: 113 mg/dL — ABNORMAL HIGH (ref 70–99)
Potassium: 3.7 mmol/L (ref 3.5–5.1)
Sodium: 140 mmol/L (ref 135–145)
Total Bilirubin: 0.5 mg/dL (ref 0.3–1.2)
Total Protein: 7.3 g/dL (ref 6.5–8.1)

## 2023-06-03 LAB — CBC AND DIFFERENTIAL
HCT: 42 (ref 36–46)
Hemoglobin: 14.2 (ref 12.0–16.0)
Neutrophils Absolute: 3.96
Platelets: 198 10*3/uL (ref 150–400)
WBC: 6.6

## 2023-06-03 LAB — TSH: TSH: 1.493 u[IU]/mL (ref 0.350–4.500)

## 2023-06-03 LAB — CBC: RBC: 4.3 (ref 3.87–5.11)

## 2023-06-03 LAB — CBC W DIFFERENTIAL (~~LOC~~ CC SCANNED REPORT)

## 2023-06-04 ENCOUNTER — Other Ambulatory Visit: Payer: Self-pay

## 2023-06-05 ENCOUNTER — Inpatient Hospital Stay: Payer: Medicare PPO

## 2023-06-05 VITALS — BP 150/77 | HR 82 | Temp 98.0°F | Resp 18 | Wt 147.0 lb

## 2023-06-05 DIAGNOSIS — D3001 Benign neoplasm of right kidney: Secondary | ICD-10-CM

## 2023-06-05 DIAGNOSIS — Z5112 Encounter for antineoplastic immunotherapy: Secondary | ICD-10-CM | POA: Diagnosis not present

## 2023-06-05 DIAGNOSIS — C649 Malignant neoplasm of unspecified kidney, except renal pelvis: Secondary | ICD-10-CM | POA: Diagnosis not present

## 2023-06-05 DIAGNOSIS — Z79620 Long term (current) use of immunosuppressive biologic: Secondary | ICD-10-CM | POA: Diagnosis not present

## 2023-06-05 LAB — T4: T4, Total: 5.9 ug/dL (ref 4.5–12.0)

## 2023-06-05 MED ORDER — SODIUM CHLORIDE 0.9 % IV SOLN
480.0000 mg | Freq: Once | INTRAVENOUS | Status: AC
  Administered 2023-06-05: 480 mg via INTRAVENOUS
  Filled 2023-06-05: qty 48

## 2023-06-05 MED ORDER — SODIUM CHLORIDE 0.9 % IV SOLN: Freq: Once | INTRAVENOUS | Status: AC

## 2023-06-05 MED ORDER — HEPARIN SOD (PORK) LOCK FLUSH 100 UNIT/ML IV SOLN
500.0000 [IU] | Freq: Once | INTRAVENOUS | Status: AC | PRN
  Administered 2023-06-05: 500 [IU]

## 2023-06-05 MED ORDER — SODIUM CHLORIDE 0.9% FLUSH
10.0000 mL | INTRAVENOUS | Status: DC | PRN
  Administered 2023-06-05: 10 mL

## 2023-06-05 NOTE — Patient Instructions (Signed)
Nivolumab Injection What is this medication? NIVOLUMAB (nye VOL ue mab) treats some types of cancer. It works by helping your immune system slow or stop the spread of cancer cells. It is a monoclonal antibody. This medicine may be used for other purposes; ask your health care provider or pharmacist if you have questions. COMMON BRAND NAME(S): Opdivo What should I tell my care team before I take this medication? They need to know if you have any of these conditions: Allogeneic stem cell transplant (uses someone else's stem cells) Autoimmune diseases, such as Crohn disease, ulcerative colitis, lupus History of chest radiation Nervous system problems, such as Guillain-Barre syndrome or myasthenia gravis Organ transplant An unusual or allergic reaction to nivolumab, other medications, foods, dyes, or preservatives Pregnant or trying to get pregnant Breast-feeding How should I use this medication? This medication is infused into a vein. It is given in a hospital or clinic setting. A special MedGuide will be given to you before each treatment. Be sure to read this information carefully each time. Talk to your care team about the use of this medication in children. While it may be prescribed for children as young as 12 years for selected conditions, precautions do apply. Overdosage: If you think you have taken too much of this medicine contact a poison control center or emergency room at once. NOTE: This medicine is only for you. Do not share this medicine with others. What if I miss a dose? Keep appointments for follow-up doses. It is important not to miss your dose. Call your care team if you are unable to keep an appointment. What may interact with this medication? Interactions have not been studied. This list may not describe all possible interactions. Give your health care provider a list of all the medicines, herbs, non-prescription drugs, or dietary supplements you use. Also tell them if you  smoke, drink alcohol, or use illegal drugs. Some items may interact with your medicine. What should I watch for while using this medication? Your condition will be monitored carefully while you are receiving this medication. You may need blood work while taking this medication. This medication may cause serious skin reactions. They can happen weeks to months after starting the medication. Contact your care team right away if you notice fevers or flu-like symptoms with a rash. The rash may be red or purple and then turn into blisters or peeling of the skin. You may also notice a red rash with swelling of the face, lips, or lymph nodes in your neck or under your arms. Tell your care team right away if you have any change in your eyesight. Talk to your care team if you are pregnant or think you might be pregnant. A negative pregnancy test is required before starting this medication. A reliable form of contraception is recommended while taking this medication and for 5 months after the last dose. Talk to your care team about effective forms of contraception. Do not breast-feed while taking this medication and for 5 months after the last dose. What side effects may I notice from receiving this medication? Side effects that you should report to your care team as soon as possible: Allergic reactions--skin rash, itching, hives, swelling of the face, lips, tongue, or throat Dry cough, shortness of breath or trouble breathing Eye pain, redness, irritation, or discharge with blurry or decreased vision Heart muscle inflammation--unusual weakness or fatigue, shortness of breath, chest pain, fast or irregular heartbeat, dizziness, swelling of the ankles, feet, or hands Hormone   gland problems--headache, sensitivity to light, unusual weakness or fatigue, dizziness, fast or irregular heartbeat, increased sensitivity to cold or heat, excessive sweating, constipation, hair loss, increased thirst or amount of urine,  tremors or shaking, irritability Infusion reactions--chest pain, shortness of breath or trouble breathing, feeling faint or lightheaded Kidney injury (glomerulonephritis)--decrease in the amount of urine, red or dark brown urine, foamy or bubbly urine, swelling of the ankles, hands, or feet Liver injury--right upper belly pain, loss of appetite, nausea, light-colored stool, dark yellow or brown urine, yellowing skin or eyes, unusual weakness or fatigue Pain, tingling, or numbness in the hands or feet, muscle weakness, change in vision, confusion or trouble speaking, loss of balance or coordination, trouble walking, seizures Rash, fever, and swollen lymph nodes Redness, blistering, peeling, or loosening of the skin, including inside the mouth Sudden or severe stomach pain, bloody diarrhea, fever, nausea, vomiting Side effects that usually do not require medical attention (report these to your care team if they continue or are bothersome): Bone, joint, or muscle pain Diarrhea Fatigue Loss of appetite Nausea Skin rash This list may not describe all possible side effects. Call your doctor for medical advice about side effects. You may report side effects to FDA at 1-800-FDA-1088. Where should I keep my medication? This medication is given in a hospital or clinic. It will not be stored at home. NOTE: This sheet is a summary. It may not cover all possible information. If you have questions about this medicine, talk to your doctor, pharmacist, or health care provider.  2024 Elsevier/Gold Standard (2022-04-08 00:00:00)  

## 2023-06-30 NOTE — Progress Notes (Unsigned)
[facility]  [address] [place],  [place]  [address] [phone]  Clinic Day:  07/01/2023  Referring physician: Lise Auer, MD  HISTORY OF PRESENT ILLNESS:  The patient is a 72 y.o. female with metastatic renal cell carcinoma, proven per cytology from a right-sided thoracentesis in early December 2021.  She comes in today to be evaluated before heading into her 29th cycle of maintenance nivolumab. The patient tolerated her 28th cycle well without having any significant difficulties. Of note, this patient's maintenance nivolumab was preceded by 3 cycles of nivolumab/ipilimumab; a 4th cycle was not given due to her being hospitalized for severe colitis related to the ipilimumab component of her combination immunotherapy.  Since her last visit, the patient has been doing well.  Other than minimal shortness of breath, she denies having any new problems which concern her for disease progression.      PHYSICAL EXAM:  Blood pressure (!) 202/84, pulse 63, temperature 98.5 F (36.9 C), resp. rate 16, height 5\' 1"  (1.549 m), weight 147 lb (66.7 kg), SpO2 93 %. Wt Readings from Last 3 Encounters:  07/01/23 147 lb (66.7 kg)  06/05/23 147 lb (66.7 kg)  06/03/23 148 lb 1.6 oz (67.2 kg)   Body mass index is 27.78 kg/m.  Performance status (ECOG): 1 - Symptomatic but completely ambulatory  Physical Exam Vitals and nursing note reviewed.  Constitutional:      General: She is not in acute distress.    Appearance: Normal appearance.  HENT:     Head: Normocephalic and atraumatic.     Mouth/Throat:     Mouth: Mucous membranes are moist.     Pharynx: Oropharynx is clear. No oropharyngeal exudate or posterior oropharyngeal erythema.  Eyes:     General: No scleral icterus.    Extraocular Movements: Extraocular movements intact.     Conjunctiva/sclera: Conjunctivae normal.     Pupils: Pupils are equal, round, and reactive to light.  Cardiovascular:     Rate and  Rhythm: Normal rate and regular rhythm.     Heart sounds: Normal heart sounds. No murmur heard.    No friction rub. No gallop.  Pulmonary:     Effort: Pulmonary effort is normal.     Breath sounds: Examination of the right-lower field reveals decreased breath sounds. Decreased breath sounds present. No wheezing, rhonchi or rales.  Chest:     Chest wall: No lacerations, swelling, tenderness or crepitus.  Abdominal:     General: There is no distension.     Palpations: Abdomen is soft. There is no hepatomegaly, splenomegaly or mass.     Tenderness: There is no abdominal tenderness.  Musculoskeletal:        General: Normal range of motion.     Cervical back: Normal range of motion and neck supple. No tenderness.     Right lower leg: No edema.     Left lower leg: No edema.  Lymphadenopathy:     Cervical: No cervical adenopathy.     Upper Body:     Right upper body: No supraclavicular or axillary adenopathy.     Left upper body: No supraclavicular or axillary adenopathy.     Lower Body: No right inguinal adenopathy. No left inguinal adenopathy.  Skin:    General: Skin is warm and dry.     Coloration: Skin is not jaundiced.     Findings: No ecchymosis or rash.  Neurological:     Mental Status: She is alert and oriented  to person, place, and time.     Cranial Nerves: No cranial nerve deficit.  Psychiatric:        Mood and Affect: Mood normal.        Behavior: Behavior normal.        Thought Content: Thought content normal.    LABS:    Latest Reference Range & Units 07/01/23 00:00  WBC  7.1 (E)  RBC 3.87 - 5.11  4.33 (E)  Hemoglobin 12.0 - 16.0  14.3 (E)  HCT 36 - 46  42 (E)  Platelets 150 - 400 K/uL 180 (E)  NEUT#  4.26 (E)  (E): External lab result  ASSESSMENT & PLAN:  Assessment/Plan:  72 y.o. female with metastatic renal cell carcinoma.  She will proceed with her 29th cycle of maintenance nivolumab immunotherapy later this week.  Clinically, the patient continues to do  well.  I will see her back in 4 weeks before she heads into her 30th cycle of maintenance nivolumab.  The patient understands all the plans discussed today and is in agreement with them.    Tristin Vandeusen Kirby Funk, MD

## 2023-07-01 ENCOUNTER — Inpatient Hospital Stay: Payer: Medicare PPO | Attending: Oncology

## 2023-07-01 ENCOUNTER — Inpatient Hospital Stay (INDEPENDENT_AMBULATORY_CARE_PROVIDER_SITE_OTHER): Payer: Medicare PPO | Admitting: Oncology

## 2023-07-01 DIAGNOSIS — Z79620 Long term (current) use of immunosuppressive biologic: Secondary | ICD-10-CM | POA: Insufficient documentation

## 2023-07-01 DIAGNOSIS — C641 Malignant neoplasm of right kidney, except renal pelvis: Secondary | ICD-10-CM | POA: Diagnosis not present

## 2023-07-01 DIAGNOSIS — D3001 Benign neoplasm of right kidney: Secondary | ICD-10-CM

## 2023-07-01 DIAGNOSIS — Z5112 Encounter for antineoplastic immunotherapy: Secondary | ICD-10-CM | POA: Insufficient documentation

## 2023-07-01 DIAGNOSIS — D649 Anemia, unspecified: Secondary | ICD-10-CM | POA: Diagnosis not present

## 2023-07-01 LAB — HEPATIC FUNCTION PANEL
ALT: 29 U/L (ref 7–35)
AST: 37 — AB (ref 13–35)
Alkaline Phosphatase: 69 (ref 25–125)
Bilirubin, Total: 0.6

## 2023-07-01 LAB — COMPREHENSIVE METABOLIC PANEL
Albumin: 4.3 (ref 3.5–5.0)
Calcium: 9.3 (ref 8.7–10.7)

## 2023-07-01 LAB — CBC: RBC: 4.33 (ref 3.87–5.11)

## 2023-07-01 LAB — BASIC METABOLIC PANEL
BUN: 10 (ref 4–21)
CO2: 25 — AB (ref 13–22)
Chloride: 104 (ref 99–108)
Creatinine: 0.7 (ref 0.5–1.1)
EGFR: 60
Glucose: 105
Potassium: 4 mEq/L (ref 3.5–5.1)
Sodium: 139 (ref 137–147)

## 2023-07-01 LAB — CBC AND DIFFERENTIAL
HCT: 42 (ref 36–46)
Hemoglobin: 14.3 (ref 12.0–16.0)
Neutrophils Absolute: 4.26
Platelets: 180 10*3/uL (ref 150–400)
WBC: 7.1

## 2023-07-02 ENCOUNTER — Telehealth: Payer: Self-pay | Admitting: Oncology

## 2023-07-02 DIAGNOSIS — H8109 Meniere's disease, unspecified ear: Secondary | ICD-10-CM | POA: Diagnosis not present

## 2023-07-02 MED FILL — Nivolumab IV Soln 240 MG/24ML: INTRAVENOUS | Qty: 48 | Status: AC

## 2023-07-02 NOTE — Telephone Encounter (Signed)
Patient has been scheduled. Aware of appt date and time  Scheduling Message Entered by Rennis Harding A on 07/01/2023 at 11:59 AM Priority: Routine <No visit type provided>  Department: [facility]  Provider:  Scheduling Notes:  Labs/appt 07-29-23

## 2023-07-03 ENCOUNTER — Inpatient Hospital Stay: Payer: Medicare PPO

## 2023-07-03 ENCOUNTER — Other Ambulatory Visit: Payer: Self-pay

## 2023-07-03 VITALS — BP 165/83 | HR 64 | Temp 98.1°F | Resp 18 | Ht 61.0 in | Wt 144.0 lb

## 2023-07-03 DIAGNOSIS — Z5112 Encounter for antineoplastic immunotherapy: Secondary | ICD-10-CM | POA: Diagnosis not present

## 2023-07-03 DIAGNOSIS — D3001 Benign neoplasm of right kidney: Secondary | ICD-10-CM

## 2023-07-03 DIAGNOSIS — C641 Malignant neoplasm of right kidney, except renal pelvis: Secondary | ICD-10-CM | POA: Diagnosis not present

## 2023-07-03 DIAGNOSIS — Z79620 Long term (current) use of immunosuppressive biologic: Secondary | ICD-10-CM | POA: Diagnosis not present

## 2023-07-03 MED ORDER — SODIUM CHLORIDE 0.9 % IV SOLN: Freq: Once | INTRAVENOUS | Status: AC

## 2023-07-03 MED ORDER — SODIUM CHLORIDE 0.9% FLUSH
10.0000 mL | INTRAVENOUS | Status: DC | PRN
  Administered 2023-07-03: 10 mL

## 2023-07-03 MED ORDER — HEPARIN SOD (PORK) LOCK FLUSH 100 UNIT/ML IV SOLN
500.0000 [IU] | Freq: Once | INTRAVENOUS | Status: AC | PRN
  Administered 2023-07-03: 500 [IU]

## 2023-07-03 MED ORDER — SODIUM CHLORIDE 0.9 % IV SOLN
480.0000 mg | Freq: Once | INTRAVENOUS | Status: AC
  Administered 2023-07-03: 480 mg via INTRAVENOUS
  Filled 2023-07-03: qty 48

## 2023-07-03 NOTE — Patient Instructions (Signed)
Nivolumab Injection What is this medication? NIVOLUMAB (nye VOL ue mab) treats some types of cancer. It works by helping your immune system slow or stop the spread of cancer cells. It is a monoclonal antibody. This medicine may be used for other purposes; ask your health care provider or pharmacist if you have questions. COMMON BRAND NAME(S): Opdivo What should I tell my care team before I take this medication? They need to know if you have any of these conditions: Allogeneic stem cell transplant (uses someone else's stem cells) Autoimmune diseases, such as Crohn disease, ulcerative colitis, lupus History of chest radiation Nervous system problems, such as Guillain-Barre syndrome or myasthenia gravis Organ transplant An unusual or allergic reaction to nivolumab, other medications, foods, dyes, or preservatives Pregnant or trying to get pregnant Breast-feeding How should I use this medication? This medication is infused into a vein. It is given in a hospital or clinic setting. A special MedGuide will be given to you before each treatment. Be sure to read this information carefully each time. Talk to your care team about the use of this medication in children. While it may be prescribed for children as young as 12 years for selected conditions, precautions do apply. Overdosage: If you think you have taken too much of this medicine contact a poison control center or emergency room at once. NOTE: This medicine is only for you. Do not share this medicine with others. What if I miss a dose? Keep appointments for follow-up doses. It is important not to miss your dose. Call your care team if you are unable to keep an appointment. What may interact with this medication? Interactions have not been studied. This list may not describe all possible interactions. Give your health care provider a list of all the medicines, herbs, non-prescription drugs, or dietary supplements you use. Also tell them if you  smoke, drink alcohol, or use illegal drugs. Some items may interact with your medicine. What should I watch for while using this medication? Your condition will be monitored carefully while you are receiving this medication. You may need blood work while taking this medication. This medication may cause serious skin reactions. They can happen weeks to months after starting the medication. Contact your care team right away if you notice fevers or flu-like symptoms with a rash. The rash may be red or purple and then turn into blisters or peeling of the skin. You may also notice a red rash with swelling of the face, lips, or lymph nodes in your neck or under your arms. Tell your care team right away if you have any change in your eyesight. Talk to your care team if you are pregnant or think you might be pregnant. A negative pregnancy test is required before starting this medication. A reliable form of contraception is recommended while taking this medication and for 5 months after the last dose. Talk to your care team about effective forms of contraception. Do not breast-feed while taking this medication and for 5 months after the last dose. What side effects may I notice from receiving this medication? Side effects that you should report to your care team as soon as possible: Allergic reactions--skin rash, itching, hives, swelling of the face, lips, tongue, or throat Dry cough, shortness of breath or trouble breathing Eye pain, redness, irritation, or discharge with blurry or decreased vision Heart muscle inflammation--unusual weakness or fatigue, shortness of breath, chest pain, fast or irregular heartbeat, dizziness, swelling of the ankles, feet, or hands Hormone   gland problems--headache, sensitivity to light, unusual weakness or fatigue, dizziness, fast or irregular heartbeat, increased sensitivity to cold or heat, excessive sweating, constipation, hair loss, increased thirst or amount of urine,  tremors or shaking, irritability Infusion reactions--chest pain, shortness of breath or trouble breathing, feeling faint or lightheaded Kidney injury (glomerulonephritis)--decrease in the amount of urine, red or dark brown urine, foamy or bubbly urine, swelling of the ankles, hands, or feet Liver injury--right upper belly pain, loss of appetite, nausea, light-colored stool, dark yellow or brown urine, yellowing skin or eyes, unusual weakness or fatigue Pain, tingling, or numbness in the hands or feet, muscle weakness, change in vision, confusion or trouble speaking, loss of balance or coordination, trouble walking, seizures Rash, fever, and swollen lymph nodes Redness, blistering, peeling, or loosening of the skin, including inside the mouth Sudden or severe stomach pain, bloody diarrhea, fever, nausea, vomiting Side effects that usually do not require medical attention (report these to your care team if they continue or are bothersome): Bone, joint, or muscle pain Diarrhea Fatigue Loss of appetite Nausea Skin rash This list may not describe all possible side effects. Call your doctor for medical advice about side effects. You may report side effects to FDA at 1-800-FDA-1088. Where should I keep my medication? This medication is given in a hospital or clinic. It will not be stored at home. NOTE: This sheet is a summary. It may not cover all possible information. If you have questions about this medicine, talk to your doctor, pharmacist, or health care provider.  2024 Elsevier/Gold Standard (2022-04-08 00:00:00)  

## 2023-07-17 ENCOUNTER — Encounter: Payer: Self-pay | Admitting: Oncology

## 2023-07-24 NOTE — Progress Notes (Deleted)
[facility]  [address] [place],  [place]  [address] [phone]  Clinic Day:  07/24/2023  Referring physician: Lise Auer, MD   HISTORY OF PRESENT ILLNESS:  The patient is a 72 y.o. female with ***   PHYSICAL EXAM:  There were no vitals taken for this visit. Wt Readings from Last 3 Encounters:  07/03/23 144 lb (65.3 kg)  07/01/23 147 lb (66.7 kg)  06/05/23 147 lb (66.7 kg)   There is no height or weight on file to calculate BMI.  Performance status (ECOG): {CHL ONC [id number]  Physical Exam  LABS:      Latest Ref Rng & Units 07/01/2023   12:00 AM 06/03/2023   12:00 AM 05/06/2023   12:00 AM  CBC  WBC  7.1     6.6     6.4      Hemoglobin 12.0 - 16.0 14.3     14.2     14.2      Hematocrit 36 - 46 42     42     42      Platelets 150 - 400 K/uL 180     198     183         This result is from an external source.      Latest Ref Rng & Units 07/01/2023   12:00 AM 06/03/2023   10:39 AM 05/06/2023   10:04 AM  CMP  Glucose 70 - 99 mg/dL  295  621   BUN 4 - 21 10     11  13    Creatinine 0.5 - 1.1 0.7     0.81  0.82   Sodium 137 - 147 139     140  135   Potassium 3.5 - 5.1 mEq/L 4.0     3.7  3.8   Chloride 99 - 108 104     106  107   CO2 13 - 22 25     25  25    Calcium 8.7 - 10.7 9.3     9.0  8.5   Total Protein 6.5 - 8.1 g/dL  7.3  7.5   Total Bilirubin 0.3 - 1.2 mg/dL  0.5  0.7   Alkaline Phos 25 - 125 69     53  72   AST 13 - 35 37     26  38   ALT 7 - 35 U/L 29     28  34      This result is from an external source.     No results found for: "CEA1", "CEA" / No results found for: "CEA1", "CEA" No results found for: "PSA1" No results found for: "HYQ657" No results found for: "CAN125"  No results found for: "TOTALPROTELP", "ALBUMINELP", "A1GS", "A2GS", "BETS", "BETA2SER", "GAMS", "MSPIKE", "SPEI" No results found for: "TIBC", "FERRITIN", "IRONPCTSAT" Lab Results  Component Value Date   LDH 180 11/28/2020        Component Value Date/Time   LDH 180 11/28/2020 0955    Review Flowsheet       Latest Ref Rng & Units 11/28/2020  Oncology Labs  LDH 120 - 250 U/L 180     Details             STUDIES:  No results found.    ASSESSMENT & PLAN:   Assessment/Plan:  72 y.o. female with ***  The patient understands all the plans discussed today and is in agreement with them.  She knows to contact our office if she develops concerns prior to her next appointment.     Adah Perl, PA-C   Physician Assistant [facility] [phone]

## 2023-07-28 ENCOUNTER — Telehealth: Payer: Self-pay

## 2023-07-29 ENCOUNTER — Inpatient Hospital Stay: Payer: Medicare PPO | Admitting: Hematology and Oncology

## 2023-07-29 ENCOUNTER — Inpatient Hospital Stay: Payer: Medicare PPO

## 2023-07-30 ENCOUNTER — Encounter: Payer: Self-pay | Admitting: Oncology

## 2023-07-31 ENCOUNTER — Encounter: Payer: Self-pay | Admitting: Oncology

## 2023-07-31 ENCOUNTER — Ambulatory Visit: Payer: Medicare PPO

## 2023-08-01 ENCOUNTER — Other Ambulatory Visit: Payer: Self-pay

## 2023-08-06 DIAGNOSIS — J329 Chronic sinusitis, unspecified: Secondary | ICD-10-CM | POA: Diagnosis not present

## 2023-08-06 DIAGNOSIS — Z6825 Body mass index (BMI) 25.0-25.9, adult: Secondary | ICD-10-CM | POA: Diagnosis not present

## 2023-08-06 DIAGNOSIS — H6121 Impacted cerumen, right ear: Secondary | ICD-10-CM | POA: Diagnosis not present

## 2023-08-11 NOTE — Progress Notes (Unsigned)
[facility]  [address] [place],  [place]  [address] [phone]  Clinic Day:  08/12/2023  Referring physician: Lise Auer, MD  HISTORY OF PRESENT ILLNESS:  The patient is a 72 y.o. female with metastatic renal cell carcinoma, proven per cytology from a right-sided thoracentesis in early December 2021.  She comes in today to be evaluated before heading into her 30th cycle of maintenance nivolumab. The patient tolerated her 29th cycle well without having any significant difficulties. Of note, this patient's maintenance nivolumab was preceded by 3 cycles of nivolumab/ipilimumab; a 4th cycle was not given due to her being hospitalized for severe colitis related to the ipilimumab component of her combination immunotherapy.  Since her last visit, the patient has been doing okay.  However, she has been coughing more.  She has also been more short of breath to where she believes a repeat thoracentesis needs to be done.    PHYSICAL EXAM:  Blood pressure 138/70, pulse 65, temperature 98.8 F (37.1 C), resp. rate 16, height 5\' 1"  (1.549 m), weight 146 lb 14.4 oz (66.6 kg), SpO2 90%. Wt Readings from Last 3 Encounters:  08/12/23 146 lb 14.4 oz (66.6 kg)  07/03/23 144 lb (65.3 kg)  07/01/23 147 lb (66.7 kg)   Body mass index is 27.76 kg/m.  Performance status (ECOG): 1 - Symptomatic but completely ambulatory  Physical Exam Vitals and nursing note reviewed.  Constitutional:      General: She is not in acute distress.    Appearance: Normal appearance.  HENT:     Head: Normocephalic and atraumatic.     Mouth/Throat:     Mouth: Mucous membranes are moist.     Pharynx: Oropharynx is clear. No oropharyngeal exudate or posterior oropharyngeal erythema.  Eyes:     General: No scleral icterus.    Extraocular Movements: Extraocular movements intact.     Conjunctiva/sclera: Conjunctivae normal.     Pupils: Pupils are equal, round, and reactive to light.   Cardiovascular:     Rate and Rhythm: Normal rate and regular rhythm.     Heart sounds: Normal heart sounds. No murmur heard.    No friction rub. No gallop.  Pulmonary:     Effort: Pulmonary effort is normal.     Breath sounds: Examination of the right-lower field reveals decreased breath sounds. Decreased breath sounds present. No wheezing, rhonchi or rales.  Chest:     Chest wall: No lacerations, swelling, tenderness or crepitus.  Abdominal:     General: There is no distension.     Palpations: Abdomen is soft. There is no hepatomegaly, splenomegaly or mass.     Tenderness: There is no abdominal tenderness.  Musculoskeletal:        General: Normal range of motion.     Cervical back: Normal range of motion and neck supple. No tenderness.     Right lower leg: No edema.     Left lower leg: No edema.  Lymphadenopathy:     Cervical: No cervical adenopathy.     Upper Body:     Right upper body: No supraclavicular or axillary adenopathy.     Left upper body: No supraclavicular or axillary adenopathy.     Lower Body: No right inguinal adenopathy. No left inguinal adenopathy.  Skin:    General: Skin is warm and dry.     Coloration: Skin is not jaundiced.     Findings: No ecchymosis or rash.  Neurological:     Mental  Status: She is alert and oriented to person, place, and time.     Cranial Nerves: No cranial nerve deficit.  Psychiatric:        Mood and Affect: Mood normal.        Behavior: Behavior normal.        Thought Content: Thought content normal.    LABS:    Latest Reference Range & Units 07/01/23 00:00  WBC  7.1 (E)  RBC 3.87 - 5.11  4.33 (E)  Hemoglobin 12.0 - 16.0  14.3 (E)  HCT 36 - 46  42 (E)  Platelets 150 - 400 K/uL 180 (E)  NEUT#  4.26 (E)  (E): External lab result  ASSESSMENT & PLAN:  Assessment/Plan:  72 y.o. female with metastatic renal cell carcinoma.  She will proceed with her 30th cycle of maintenance nivolumab immunotherapy later this week.  Clinically,  the patient continues to do well.  I will see her back in 4 weeks before she heads into her 31st cycle of maintenance nivolumab.  CT scans will be done a day before her next visit to ascertain her new disease baseline after 30 cycles of maintenance nivolumab.  The patient understands all the plans discussed today and is in agreement with them.    Kyria Bumgardner Kirby Funk, MD

## 2023-08-12 ENCOUNTER — Other Ambulatory Visit: Payer: Self-pay | Admitting: Oncology

## 2023-08-12 ENCOUNTER — Inpatient Hospital Stay (INDEPENDENT_AMBULATORY_CARE_PROVIDER_SITE_OTHER): Payer: Medicare PPO | Admitting: Oncology

## 2023-08-12 ENCOUNTER — Inpatient Hospital Stay: Payer: Medicare PPO | Attending: Oncology

## 2023-08-12 DIAGNOSIS — D3001 Benign neoplasm of right kidney: Secondary | ICD-10-CM

## 2023-08-12 DIAGNOSIS — C649 Malignant neoplasm of unspecified kidney, except renal pelvis: Secondary | ICD-10-CM | POA: Insufficient documentation

## 2023-08-12 DIAGNOSIS — Z79620 Long term (current) use of immunosuppressive biologic: Secondary | ICD-10-CM | POA: Diagnosis not present

## 2023-08-12 DIAGNOSIS — Z5112 Encounter for antineoplastic immunotherapy: Secondary | ICD-10-CM | POA: Insufficient documentation

## 2023-08-12 DIAGNOSIS — C641 Malignant neoplasm of right kidney, except renal pelvis: Secondary | ICD-10-CM

## 2023-08-12 DIAGNOSIS — D649 Anemia, unspecified: Secondary | ICD-10-CM | POA: Diagnosis not present

## 2023-08-12 LAB — CBC AND DIFFERENTIAL
HCT: 40 (ref 36–46)
Hemoglobin: 13.6 (ref 12.0–16.0)
Neutrophils Absolute: 3.84
Platelets: 194 10*3/uL (ref 150–400)
WBC: 6.4

## 2023-08-12 LAB — CMP (CANCER CENTER ONLY)
ALT: 32 U/L (ref 0–44)
AST: 29 U/L (ref 15–41)
Albumin: 4.3 g/dL (ref 3.5–5.0)
Alkaline Phosphatase: 63 U/L (ref 38–126)
Anion gap: 8 (ref 5–15)
BUN: 14 mg/dL (ref 8–23)
CO2: 25 mmol/L (ref 22–32)
Calcium: 8.8 mg/dL — ABNORMAL LOW (ref 8.9–10.3)
Chloride: 107 mmol/L (ref 98–111)
Creatinine: 0.8 mg/dL (ref 0.44–1.00)
GFR, Estimated: >60 mL/min (ref >60)
Glucose, Bld: 116 mg/dL — ABNORMAL HIGH (ref 70–99)
Potassium: 3.8 mmol/L (ref 3.5–5.1)
Sodium: 140 mmol/L (ref 135–145)
Total Bilirubin: 0.8 mg/dL (ref 0.3–1.2)
Total Protein: 7.1 g/dL (ref 6.5–8.1)

## 2023-08-12 LAB — TSH: TSH: 1.735 u[IU]/mL (ref 0.350–4.500)

## 2023-08-12 LAB — CBC: RBC: 4.09 (ref 3.87–5.11)

## 2023-08-12 LAB — CBC W DIFFERENTIAL (~~LOC~~ CC SCANNED REPORT)

## 2023-08-13 ENCOUNTER — Other Ambulatory Visit: Payer: Self-pay

## 2023-08-13 LAB — T4: T4, Total: 6.2 ug/dL (ref 4.5–12.0)

## 2023-08-13 MED FILL — Nivolumab IV Soln 240 MG/24ML: INTRAVENOUS | Qty: 48 | Status: AC

## 2023-08-14 ENCOUNTER — Inpatient Hospital Stay: Payer: Medicare PPO

## 2023-08-14 VITALS — BP 158/72 | HR 73 | Temp 97.5°F | Resp 18 | Ht 61.0 in | Wt 146.4 lb

## 2023-08-14 DIAGNOSIS — J90 Pleural effusion, not elsewhere classified: Secondary | ICD-10-CM | POA: Diagnosis not present

## 2023-08-14 DIAGNOSIS — C649 Malignant neoplasm of unspecified kidney, except renal pelvis: Secondary | ICD-10-CM | POA: Diagnosis not present

## 2023-08-14 DIAGNOSIS — D3001 Benign neoplasm of right kidney: Secondary | ICD-10-CM

## 2023-08-14 DIAGNOSIS — R846 Abnormal cytological findings in specimens from respiratory organs and thorax: Secondary | ICD-10-CM | POA: Diagnosis not present

## 2023-08-14 DIAGNOSIS — Z48813 Encounter for surgical aftercare following surgery on the respiratory system: Secondary | ICD-10-CM | POA: Diagnosis not present

## 2023-08-14 DIAGNOSIS — Z5112 Encounter for antineoplastic immunotherapy: Secondary | ICD-10-CM | POA: Diagnosis not present

## 2023-08-14 DIAGNOSIS — I700 Atherosclerosis of aorta: Secondary | ICD-10-CM | POA: Diagnosis not present

## 2023-08-14 DIAGNOSIS — Z4682 Encounter for fitting and adjustment of non-vascular catheter: Secondary | ICD-10-CM | POA: Diagnosis not present

## 2023-08-14 DIAGNOSIS — Z79620 Long term (current) use of immunosuppressive biologic: Secondary | ICD-10-CM | POA: Diagnosis not present

## 2023-08-14 MED ORDER — SODIUM CHLORIDE 0.9 % IV SOLN: Freq: Once | INTRAVENOUS | Status: AC

## 2023-08-14 MED ORDER — SODIUM CHLORIDE 0.9% FLUSH
10.0000 mL | INTRAVENOUS | Status: DC | PRN
  Administered 2023-08-14: 10 mL

## 2023-08-14 MED ORDER — HEPARIN SOD (PORK) LOCK FLUSH 100 UNIT/ML IV SOLN
500.0000 [IU] | Freq: Once | INTRAVENOUS | Status: AC | PRN
  Administered 2023-08-14: 500 [IU]

## 2023-08-14 MED ORDER — SODIUM CHLORIDE 0.9 % IV SOLN
480.0000 mg | Freq: Once | INTRAVENOUS | Status: AC
  Administered 2023-08-14: 480 mg via INTRAVENOUS
  Filled 2023-08-14: qty 48

## 2023-08-14 NOTE — Patient Instructions (Signed)
Nivolumab Injection What is this medication? NIVOLUMAB (nye VOL ue mab) treats some types of cancer. It works by helping your immune system slow or stop the spread of cancer cells. It is a monoclonal antibody. This medicine may be used for other purposes; ask your health care provider or pharmacist if you have questions. COMMON BRAND NAME(S): Opdivo What should I tell my care team before I take this medication? They need to know if you have any of these conditions: Allogeneic stem cell transplant (uses someone else's stem cells) Autoimmune diseases, such as Crohn disease, ulcerative colitis, lupus History of chest radiation Nervous system problems, such as Guillain-Barre syndrome or myasthenia gravis Organ transplant An unusual or allergic reaction to nivolumab, other medications, foods, dyes, or preservatives Pregnant or trying to get pregnant Breast-feeding How should I use this medication? This medication is infused into a vein. It is given in a hospital or clinic setting. A special MedGuide will be given to you before each treatment. Be sure to read this information carefully each time. Talk to your care team about the use of this medication in children. While it may be prescribed for children as young as 12 years for selected conditions, precautions do apply. Overdosage: If you think you have taken too much of this medicine contact a poison control center or emergency room at once. NOTE: This medicine is only for you. Do not share this medicine with others. What if I miss a dose? Keep appointments for follow-up doses. It is important not to miss your dose. Call your care team if you are unable to keep an appointment. What may interact with this medication? Interactions have not been studied. This list may not describe all possible interactions. Give your health care provider a list of all the medicines, herbs, non-prescription drugs, or dietary supplements you use. Also tell them if you  smoke, drink alcohol, or use illegal drugs. Some items may interact with your medicine. What should I watch for while using this medication? Your condition will be monitored carefully while you are receiving this medication. You may need blood work while taking this medication. This medication may cause serious skin reactions. They can happen weeks to months after starting the medication. Contact your care team right away if you notice fevers or flu-like symptoms with a rash. The rash may be red or purple and then turn into blisters or peeling of the skin. You may also notice a red rash with swelling of the face, lips, or lymph nodes in your neck or under your arms. Tell your care team right away if you have any change in your eyesight. Talk to your care team if you are pregnant or think you might be pregnant. A negative pregnancy test is required before starting this medication. A reliable form of contraception is recommended while taking this medication and for 5 months after the last dose. Talk to your care team about effective forms of contraception. Do not breast-feed while taking this medication and for 5 months after the last dose. What side effects may I notice from receiving this medication? Side effects that you should report to your care team as soon as possible: Allergic reactions--skin rash, itching, hives, swelling of the face, lips, tongue, or throat Dry cough, shortness of breath or trouble breathing Eye pain, redness, irritation, or discharge with blurry or decreased vision Heart muscle inflammation--unusual weakness or fatigue, shortness of breath, chest pain, fast or irregular heartbeat, dizziness, swelling of the ankles, feet, or hands Hormone   gland problems--headache, sensitivity to light, unusual weakness or fatigue, dizziness, fast or irregular heartbeat, increased sensitivity to cold or heat, excessive sweating, constipation, hair loss, increased thirst or amount of urine,  tremors or shaking, irritability Infusion reactions--chest pain, shortness of breath or trouble breathing, feeling faint or lightheaded Kidney injury (glomerulonephritis)--decrease in the amount of urine, red or dark brown urine, foamy or bubbly urine, swelling of the ankles, hands, or feet Liver injury--right upper belly pain, loss of appetite, nausea, light-colored stool, dark yellow or brown urine, yellowing skin or eyes, unusual weakness or fatigue Pain, tingling, or numbness in the hands or feet, muscle weakness, change in vision, confusion or trouble speaking, loss of balance or coordination, trouble walking, seizures Rash, fever, and swollen lymph nodes Redness, blistering, peeling, or loosening of the skin, including inside the mouth Sudden or severe stomach pain, bloody diarrhea, fever, nausea, vomiting Side effects that usually do not require medical attention (report these to your care team if they continue or are bothersome): Bone, joint, or muscle pain Diarrhea Fatigue Loss of appetite Nausea Skin rash This list may not describe all possible side effects. Call your doctor for medical advice about side effects. You may report side effects to FDA at 1-800-FDA-1088. Where should I keep my medication? This medication is given in a hospital or clinic. It will not be stored at home. NOTE: This sheet is a summary. It may not cover all possible information. If you have questions about this medicine, talk to your doctor, pharmacist, or health care provider.  2024 Elsevier/Gold Standard (2022-04-08 00:00:00)  

## 2023-09-08 DIAGNOSIS — R918 Other nonspecific abnormal finding of lung field: Secondary | ICD-10-CM | POA: Diagnosis not present

## 2023-09-08 DIAGNOSIS — J90 Pleural effusion, not elsewhere classified: Secondary | ICD-10-CM | POA: Diagnosis not present

## 2023-09-08 DIAGNOSIS — I700 Atherosclerosis of aorta: Secondary | ICD-10-CM | POA: Diagnosis not present

## 2023-09-08 DIAGNOSIS — C641 Malignant neoplasm of right kidney, except renal pelvis: Secondary | ICD-10-CM | POA: Diagnosis not present

## 2023-09-08 LAB — COMPREHENSIVE METABOLIC PANEL
Albumin: 4.4 (ref 3.5–5.0)
Calcium: 9 (ref 8.7–10.7)

## 2023-09-08 LAB — CBC AND DIFFERENTIAL
HCT: 43 (ref 36–46)
Hemoglobin: 14.6 (ref 12.0–16.0)
Neutrophils Absolute: 4.08
Platelets: 197 10*3/uL (ref 150–400)
WBC: 6.8

## 2023-09-08 LAB — CBC: RBC: 4.37 (ref 3.87–5.11)

## 2023-09-08 LAB — BASIC METABOLIC PANEL
BUN: 12 (ref 4–21)
CO2: 26 — AB (ref 13–22)
Chloride: 104 (ref 99–108)
Creatinine: 0.7 (ref 0.5–1.1)
EGFR: 60
Glucose: 116
Potassium: 4 meq/L (ref 3.5–5.1)
Sodium: 138 (ref 137–147)

## 2023-09-08 LAB — HEPATIC FUNCTION PANEL
ALT: 28 U/L (ref 7–35)
AST: 34 (ref 13–35)
Alkaline Phosphatase: 73 (ref 25–125)
Bilirubin, Total: 0.8

## 2023-09-08 NOTE — Progress Notes (Unsigned)
[facility]  [address] [place],  [place]  [address] [phone]  Clinic Day:  09/09/2023  Referring physician: Lise Auer, MD  HISTORY OF PRESENT ILLNESS:  The patient is a 72 y.o. female with metastatic renal cell carcinoma, proven per cytology from a right-sided thoracentesis in early December 2021.  She comes in today to go over her CT scans to ascertain her new disease baseline after 30 cycles of maintenance nivolumab. The patient tolerated her 30th cycle well without having any significant difficulties. Of note, this patient's maintenance nivolumab was preceded by 3 cycles of nivolumab/ipilimumab; a 4th cycle was not given due to her being hospitalized for severe colitis related to the ipilimumab component of her combination immunotherapy.  Since her last visit, the patient has been doing okay.  Of note, she recently had a thoracentesis done to bring down the amount of fluid in her right lung.   PHYSICAL EXAM:  Blood pressure (!) 143/64, pulse (!) 102, temperature 98.2 F (36.8 C), resp. rate 14, height 5\' 1"  (1.549 m), weight 147 lb 6.4 oz (66.9 kg), SpO2 95%. Wt Readings from Last 3 Encounters:  09/09/23 147 lb 6.4 oz (66.9 kg)  08/14/23 146 lb 6.4 oz (66.4 kg)  08/12/23 146 lb 14.4 oz (66.6 kg)   Body mass index is 27.85 kg/m.  Performance status (ECOG): 1 - Symptomatic but completely ambulatory  Physical Exam Vitals and nursing note reviewed.  Constitutional:      General: She is not in acute distress.    Appearance: Normal appearance.  HENT:     Head: Normocephalic and atraumatic.     Mouth/Throat:     Mouth: Mucous membranes are moist.     Pharynx: Oropharynx is clear. No oropharyngeal exudate or posterior oropharyngeal erythema.  Eyes:     General: No scleral icterus.    Extraocular Movements: Extraocular movements intact.     Conjunctiva/sclera: Conjunctivae normal.     Pupils: Pupils are equal, round, and reactive to  light.  Cardiovascular:     Rate and Rhythm: Normal rate and regular rhythm.     Heart sounds: Normal heart sounds. No murmur heard.    No friction rub. No gallop.  Pulmonary:     Effort: Pulmonary effort is normal.     Breath sounds: Examination of the right-lower field reveals decreased breath sounds. Decreased breath sounds present. No wheezing, rhonchi or rales.  Chest:     Chest wall: No lacerations, swelling, tenderness or crepitus.  Abdominal:     General: There is no distension.     Palpations: Abdomen is soft. There is no hepatomegaly, splenomegaly or mass.     Tenderness: There is no abdominal tenderness.  Musculoskeletal:        General: Normal range of motion.     Cervical back: Normal range of motion and neck supple. No tenderness.     Right lower leg: No edema.     Left lower leg: No edema.  Lymphadenopathy:     Cervical: No cervical adenopathy.     Upper Body:     Right upper body: No supraclavicular or axillary adenopathy.     Left upper body: No supraclavicular or axillary adenopathy.     Lower Body: No right inguinal adenopathy. No left inguinal adenopathy.  Skin:    General: Skin is warm and dry.     Coloration: Skin is not jaundiced.     Findings: No ecchymosis or rash.  Neurological:  Mental Status: She is alert and oriented to person, place, and time.     Cranial Nerves: No cranial nerve deficit.  Psychiatric:        Mood and Affect: Mood normal.        Behavior: Behavior normal.        Thought Content: Thought content normal.  SCANS:  CT scans of his chest/abdomen/pelvis revealed the following:  LABS:    Latest Reference Range & Units 07/01/23 00:00  WBC  7.1 (E)  RBC 3.87 - 5.11  4.33 (E)  Hemoglobin 12.0 - 16.0  14.3 (E)  HCT 36 - 46  42 (E)  Platelets 150 - 400 K/uL 180 (E)  NEUT#  4.26 (E)  (E): External lab result  ASSESSMENT & PLAN:  Assessment/Plan:  72 y.o. female with metastatic renal cell carcinoma.  She will proceed with her 30th  cycle of maintenance nivolumab immunotherapy later this week.  Clinically, the patient continues to do well.  I will see her back in 4 weeks before she heads into her 31st cycle of maintenance nivolumab.  CT scans will be done a day before her next visit to ascertain her new disease baseline after 30 cycles of maintenance nivolumab.  The patient understands all the plans discussed today and is in agreement with them.    Genean Adamski Kirby Funk, MD

## 2023-09-09 ENCOUNTER — Inpatient Hospital Stay: Payer: Medicare PPO | Attending: Oncology | Admitting: Oncology

## 2023-09-09 VITALS — BP 143/64 | HR 102 | Temp 98.2°F | Resp 14 | Ht 61.0 in | Wt 147.4 lb

## 2023-09-09 DIAGNOSIS — C641 Malignant neoplasm of right kidney, except renal pelvis: Secondary | ICD-10-CM | POA: Diagnosis not present

## 2023-09-09 DIAGNOSIS — D3001 Benign neoplasm of right kidney: Secondary | ICD-10-CM | POA: Diagnosis not present

## 2023-09-09 DIAGNOSIS — Z79620 Long term (current) use of immunosuppressive biologic: Secondary | ICD-10-CM | POA: Insufficient documentation

## 2023-09-09 DIAGNOSIS — Z5112 Encounter for antineoplastic immunotherapy: Secondary | ICD-10-CM | POA: Insufficient documentation

## 2023-09-10 ENCOUNTER — Encounter: Payer: Self-pay | Admitting: Oncology

## 2023-09-10 MED FILL — Nivolumab IV Soln 240 MG/24ML: INTRAVENOUS | Qty: 48 | Status: AC

## 2023-09-11 ENCOUNTER — Other Ambulatory Visit: Payer: Self-pay

## 2023-09-11 ENCOUNTER — Inpatient Hospital Stay: Payer: Medicare PPO

## 2023-09-11 VITALS — BP 146/67 | HR 69 | Temp 98.2°F | Ht 61.0 in | Wt 148.0 lb

## 2023-09-11 DIAGNOSIS — C641 Malignant neoplasm of right kidney, except renal pelvis: Secondary | ICD-10-CM | POA: Diagnosis not present

## 2023-09-11 DIAGNOSIS — Z79620 Long term (current) use of immunosuppressive biologic: Secondary | ICD-10-CM | POA: Diagnosis not present

## 2023-09-11 DIAGNOSIS — D3001 Benign neoplasm of right kidney: Secondary | ICD-10-CM

## 2023-09-11 DIAGNOSIS — Z5112 Encounter for antineoplastic immunotherapy: Secondary | ICD-10-CM | POA: Diagnosis not present

## 2023-09-11 MED ORDER — SODIUM CHLORIDE 0.9% FLUSH
10.0000 mL | INTRAVENOUS | Status: DC | PRN
  Administered 2023-09-11: 10 mL

## 2023-09-11 MED ORDER — HEPARIN SOD (PORK) LOCK FLUSH 100 UNIT/ML IV SOLN
500.0000 [IU] | Freq: Once | INTRAVENOUS | Status: AC | PRN
  Administered 2023-09-11: 500 [IU]

## 2023-09-11 MED ORDER — SODIUM CHLORIDE 0.9 % IV SOLN
480.0000 mg | Freq: Once | INTRAVENOUS | Status: AC
  Administered 2023-09-11: 480 mg via INTRAVENOUS
  Filled 2023-09-11: qty 48

## 2023-09-11 MED ORDER — SODIUM CHLORIDE 0.9 % IV SOLN: Freq: Once | INTRAVENOUS | Status: AC

## 2023-09-11 NOTE — Patient Instructions (Signed)
Nivolumab Injection What is this medication? NIVOLUMAB (nye VOL ue mab) treats some types of cancer. It works by helping your immune system slow or stop the spread of cancer cells. It is a monoclonal antibody. This medicine may be used for other purposes; ask your health care provider or pharmacist if you have questions. COMMON BRAND NAME(S): Opdivo What should I tell my care team before I take this medication? They need to know if you have any of these conditions: Allogeneic stem cell transplant (uses someone else's stem cells) Autoimmune diseases, such as Crohn disease, ulcerative colitis, lupus History of chest radiation Nervous system problems, such as Guillain-Barre syndrome or myasthenia gravis Organ transplant An unusual or allergic reaction to nivolumab, other medications, foods, dyes, or preservatives Pregnant or trying to get pregnant Breast-feeding How should I use this medication? This medication is infused into a vein. It is given in a hospital or clinic setting. A special MedGuide will be given to you before each treatment. Be sure to read this information carefully each time. Talk to your care team about the use of this medication in children. While it may be prescribed for children as young as 12 years for selected conditions, precautions do apply. Overdosage: If you think you have taken too much of this medicine contact a poison control center or emergency room at once. NOTE: This medicine is only for you. Do not share this medicine with others. What if I miss a dose? Keep appointments for follow-up doses. It is important not to miss your dose. Call your care team if you are unable to keep an appointment. What may interact with this medication? Interactions have not been studied. This list may not describe all possible interactions. Give your health care provider a list of all the medicines, herbs, non-prescription drugs, or dietary supplements you use. Also tell them if you  smoke, drink alcohol, or use illegal drugs. Some items may interact with your medicine. What should I watch for while using this medication? Your condition will be monitored carefully while you are receiving this medication. You may need blood work while taking this medication. This medication may cause serious skin reactions. They can happen weeks to months after starting the medication. Contact your care team right away if you notice fevers or flu-like symptoms with a rash. The rash may be red or purple and then turn into blisters or peeling of the skin. You may also notice a red rash with swelling of the face, lips, or lymph nodes in your neck or under your arms. Tell your care team right away if you have any change in your eyesight. Talk to your care team if you are pregnant or think you might be pregnant. A negative pregnancy test is required before starting this medication. A reliable form of contraception is recommended while taking this medication and for 5 months after the last dose. Talk to your care team about effective forms of contraception. Do not breast-feed while taking this medication and for 5 months after the last dose. What side effects may I notice from receiving this medication? Side effects that you should report to your care team as soon as possible: Allergic reactions--skin rash, itching, hives, swelling of the face, lips, tongue, or throat Dry cough, shortness of breath or trouble breathing Eye pain, redness, irritation, or discharge with blurry or decreased vision Heart muscle inflammation--unusual weakness or fatigue, shortness of breath, chest pain, fast or irregular heartbeat, dizziness, swelling of the ankles, feet, or hands Hormone  gland problems--headache, sensitivity to light, unusual weakness or fatigue, dizziness, fast or irregular heartbeat, increased sensitivity to cold or heat, excessive sweating, constipation, hair loss, increased thirst or amount of urine,  tremors or shaking, irritability Infusion reactions--chest pain, shortness of breath or trouble breathing, feeling faint or lightheaded Kidney injury (glomerulonephritis)--decrease in the amount of urine, red or dark brown urine, foamy or bubbly urine, swelling of the ankles, hands, or feet Liver injury--right upper belly pain, loss of appetite, nausea, light-colored stool, dark yellow or brown urine, yellowing skin or eyes, unusual weakness or fatigue Pain, tingling, or numbness in the hands or feet, muscle weakness, change in vision, confusion or trouble speaking, loss of balance or coordination, trouble walking, seizures Rash, fever, and swollen lymph nodes Redness, blistering, peeling, or loosening of the skin, including inside the mouth Sudden or severe stomach pain, bloody diarrhea, fever, nausea, vomiting Side effects that usually do not require medical attention (report these to your care team if they continue or are bothersome): Bone, joint, or muscle pain Diarrhea Fatigue Loss of appetite Nausea Skin rash This list may not describe all possible side effects. Call your doctor for medical advice about side effects. You may report side effects to FDA at 1-800-FDA-1088. Where should I keep my medication? This medication is given in a hospital or clinic. It will not be stored at home. NOTE: This sheet is a summary. It may not cover all possible information. If you have questions about this medicine, talk to your doctor, pharmacist, or health care provider.  2024 Elsevier/Gold Standard (2022-04-08 00:00:00)

## 2023-10-05 NOTE — Progress Notes (Unsigned)
[facility]  [address] [place],  [place]  [address] [phone]  Clinic Day:  09/09/2023  Referring physician: Weston Settle, MD  HISTORY OF PRESENT ILLNESS:  The patient is a 72 y.o. female with metastatic renal cell carcinoma, proven per cytology from a right-sided thoracentesis in early December 2021.  She comes in today to be evaluated before heading into her 32nd cycle of maintenance nivolumab. The patient tolerated her 31st cycle well without having any significant difficulties. Of note, this patient's maintenance nivolumab was preceded by 3 cycles of nivolumab/ipilimumab; a 4th cycle was not given due to her being hospitalized for severe colitis related to the ipilimumab component of her combination immunotherapy.  Since her last visit, the patient has been doing okay.  Of note, she recently had a thoracentesis done to bring down the amount of fluid in her right lung.   PHYSICAL EXAM:  There were no vitals taken for this visit. Wt Readings from Last 3 Encounters:  09/11/23 148 lb (67.1 kg)  09/09/23 147 lb 6.4 oz (66.9 kg)  08/14/23 146 lb 6.4 oz (66.4 kg)   There is no height or weight on file to calculate BMI.  Performance status (ECOG): 1 - Symptomatic but completely ambulatory  Physical Exam Vitals and nursing note reviewed.  Constitutional:      General: She is not in acute distress.    Appearance: Normal appearance.  HENT:     Head: Normocephalic and atraumatic.     Mouth/Throat:     Mouth: Mucous membranes are moist.     Pharynx: Oropharynx is clear. No oropharyngeal exudate or posterior oropharyngeal erythema.  Eyes:     General: No scleral icterus.    Extraocular Movements: Extraocular movements intact.     Conjunctiva/sclera: Conjunctivae normal.     Pupils: Pupils are equal, round, and reactive to light.  Cardiovascular:     Rate and Rhythm: Normal rate and regular rhythm.     Heart sounds: Normal heart sounds. No  murmur heard.    No friction rub. No gallop.  Pulmonary:     Effort: Pulmonary effort is normal.     Breath sounds: Examination of the right-lower field reveals decreased breath sounds. Decreased breath sounds present. No wheezing, rhonchi or rales.  Chest:     Chest wall: No lacerations, swelling, tenderness or crepitus.  Abdominal:     General: There is no distension.     Palpations: Abdomen is soft. There is no hepatomegaly, splenomegaly or mass.     Tenderness: There is no abdominal tenderness.  Musculoskeletal:        General: Normal range of motion.     Cervical back: Normal range of motion and neck supple. No tenderness.     Right lower leg: No edema.     Left lower leg: No edema.  Lymphadenopathy:     Cervical: No cervical adenopathy.     Upper Body:     Right upper body: No supraclavicular or axillary adenopathy.     Left upper body: No supraclavicular or axillary adenopathy.     Lower Body: No right inguinal adenopathy. No left inguinal adenopathy.  Skin:    General: Skin is warm and dry.     Coloration: Skin is not jaundiced.     Findings: No ecchymosis or rash.  Neurological:     Mental Status: She is alert and oriented to person, place, and time.     Cranial Nerves: No cranial nerve  deficit.  Psychiatric:        Mood and Affect: Mood normal.        Behavior: Behavior normal.        Thought Content: Thought content normal.    SCANS:  CT scans of his chest/abdomen/pelvis revealed the following: FINDINGS: CT CHEST FINDINGS  Cardiovascular: A Port-A-Cath tip high right atrium. Aortic atherosclerosis. Tortuous thoracic aorta. Normal heart size, without pericardial effusion. Lad coronary artery calcification. No central pulmonary embolism, on this non-dedicated study.  Mediastinum/Nodes: No supraclavicular adenopathy. No mediastinal or hilar adenopathy.  Lungs/Pleura: Moderate right pleural effusion is similar. Mild left pleural thickening is  unchanged.  Moderate centrilobular emphysema.  Right apical surgical sutures.  Right middle lobe volume loss is slightly improved.  Left apical pleuroparenchymal scarring.  Subpleural left lower lobe consolidation laterally is similar, favored to represent scarring.  Dependent pleural-based nodular density of 7 mm in the left lower lobe on 76/301 is new.  Musculoskeletal: No acute osseous abnormality.  CT ABDOMEN PELVIS FINDINGS  Hepatobiliary: Macrolobulated appearance of the posterior right liver lobe including at up to 2.5 cm on 47/2 is similar back to at least 12/18/2021, favored to be within normal variation. Mild hepatic steatosis.. Normal gallbladder, without biliary ductal dilatation.  Pancreas: Fatty replacement involving the pancreatic head and uncinate process. No duct dilatation or acute inflammation.  Spleen: Normal in size, without focal abnormality.  Adrenals/Urinary Tract: Normal adrenal glands. Normal left kidney, without hydronephrosis.  Partially exophytic lower pole right renal mass measures 4.7 x 3.6 cm on 74/2 versus 4.2 x 3.1 cm on the prior.  Normal urinary bladder.  Stomach/Bowel: Normal stomach, without wall thickening. Normal colon and terminal ileum. Normal small bowel.  Vascular/Lymphatic: Advanced aortic and branch vessel atherosclerosis. Patent renal veins. No abdominopelvic adenopathy.  Reproductive: Normal uterus and adnexa.  Other: No significant free fluid. Mild pelvic floor laxity.  Musculoskeletal: Trace L4-5 anterolisthesis. Thoracolumbar spondylosis.  IMPRESSION: 1. Mild enlargement of the lower pole right renal cell carcinoma. 2. Chronic left lower lobe consolidation is most likely related to scarring. An area of new adjacent pleural-based nodularity could represent isolated pulmonary metastasis or progression of the presumably postinfectious/inflammatory scarring. Consider chest CT follow-up at 3 months. 3. Similar  moderate right pleural effusion 4. Chronic macrolobulated appearance of the posterior right hepatic lobe is most likely within normal variation. An atypical appearance of cirrhosis could look similar. Correlate with risk factors. 5. Incidental findings, including: Aortic atherosclerosis (ICD10-I70.0), coronary artery atherosclerosis and emphysema (ICD10-J43.9).  LABS:    ASSESSMENT & PLAN:  Assessment/Plan:  72 y.o. female with metastatic renal cell carcinoma.  In clinic today, I went over all of her CT scan images with her, which although obvious signs of disease progression.  Clinically, she appears to be doing well.  She will proceed with her 31st cycle of maintenance nivolumab immunotherapy later this week.  Clinically, the patient continues to do well.  I will see her back in 4 weeks before she heads into her 32nd cycle of maintenance nivolumab.  The patient understands all the plans discussed today and is in agreement with them.    Limmie Schoenberg Kirby Funk, MD

## 2023-10-06 ENCOUNTER — Inpatient Hospital Stay: Payer: Medicare PPO | Attending: Oncology

## 2023-10-06 ENCOUNTER — Inpatient Hospital Stay (INDEPENDENT_AMBULATORY_CARE_PROVIDER_SITE_OTHER): Payer: Medicare PPO | Admitting: Oncology

## 2023-10-06 DIAGNOSIS — D3001 Benign neoplasm of right kidney: Secondary | ICD-10-CM | POA: Diagnosis not present

## 2023-10-06 DIAGNOSIS — Z79620 Long term (current) use of immunosuppressive biologic: Secondary | ICD-10-CM | POA: Insufficient documentation

## 2023-10-06 DIAGNOSIS — Z5112 Encounter for antineoplastic immunotherapy: Secondary | ICD-10-CM | POA: Insufficient documentation

## 2023-10-06 DIAGNOSIS — D649 Anemia, unspecified: Secondary | ICD-10-CM | POA: Diagnosis not present

## 2023-10-06 DIAGNOSIS — C641 Malignant neoplasm of right kidney, except renal pelvis: Secondary | ICD-10-CM | POA: Insufficient documentation

## 2023-10-06 LAB — CMP (CANCER CENTER ONLY)
ALT: 24 U/L (ref 0–44)
AST: 26 U/L (ref 15–41)
Albumin: 4.4 g/dL (ref 3.5–5.0)
Alkaline Phosphatase: 58 U/L (ref 38–126)
Anion gap: 11 (ref 5–15)
BUN: 15 mg/dL (ref 8–23)
CO2: 25 mmol/L (ref 22–32)
Calcium: 8.8 mg/dL — ABNORMAL LOW (ref 8.9–10.3)
Chloride: 102 mmol/L (ref 98–111)
Creatinine: 0.75 mg/dL (ref 0.44–1.00)
GFR, Estimated: >60 mL/min (ref >60)
Glucose, Bld: 99 mg/dL (ref 70–99)
Potassium: 3.7 mmol/L (ref 3.5–5.1)
Sodium: 138 mmol/L (ref 135–145)
Total Bilirubin: 0.6 mg/dL (ref 0.3–1.2)
Total Protein: 7.1 g/dL (ref 6.5–8.1)

## 2023-10-06 LAB — CBC AND DIFFERENTIAL
HCT: 40 (ref 36–46)
Hemoglobin: 13.7 (ref 12.0–16.0)
Neutrophils Absolute: 3.25
Platelets: 185 K/uL (ref 150–400)
WBC: 5.9

## 2023-10-06 LAB — TSH: TSH: 1.796 u[IU]/mL (ref 0.350–4.500)

## 2023-10-06 LAB — CBC W DIFFERENTIAL (~~LOC~~ CC SCANNED REPORT)

## 2023-10-06 LAB — CBC: RBC: 4.18 (ref 3.87–5.11)

## 2023-10-07 ENCOUNTER — Other Ambulatory Visit: Payer: Self-pay

## 2023-10-07 LAB — T4: T4, Total: 6.3 ug/dL (ref 4.5–12.0)

## 2023-10-08 ENCOUNTER — Encounter: Payer: Self-pay | Admitting: Oncology

## 2023-10-08 MED FILL — Nivolumab IV Soln 240 MG/24ML: INTRAVENOUS | Qty: 48 | Status: AC

## 2023-10-09 ENCOUNTER — Inpatient Hospital Stay: Payer: Medicare PPO

## 2023-10-09 ENCOUNTER — Encounter: Payer: Self-pay | Admitting: Oncology

## 2023-10-09 VITALS — BP 162/72 | HR 67 | Temp 97.4°F | Wt 146.1 lb

## 2023-10-09 DIAGNOSIS — D3001 Benign neoplasm of right kidney: Secondary | ICD-10-CM

## 2023-10-09 DIAGNOSIS — Z79620 Long term (current) use of immunosuppressive biologic: Secondary | ICD-10-CM | POA: Diagnosis not present

## 2023-10-09 DIAGNOSIS — Z5112 Encounter for antineoplastic immunotherapy: Secondary | ICD-10-CM | POA: Diagnosis not present

## 2023-10-09 DIAGNOSIS — C641 Malignant neoplasm of right kidney, except renal pelvis: Secondary | ICD-10-CM | POA: Diagnosis not present

## 2023-10-09 MED ORDER — SODIUM CHLORIDE 0.9% FLUSH
10.0000 mL | INTRAVENOUS | Status: DC | PRN
  Administered 2023-10-09: 10 mL

## 2023-10-09 MED ORDER — SODIUM CHLORIDE 0.9 % IV SOLN: Freq: Once | INTRAVENOUS | Status: AC

## 2023-10-09 MED ORDER — HEPARIN SOD (PORK) LOCK FLUSH 100 UNIT/ML IV SOLN
500.0000 [IU] | Freq: Once | INTRAVENOUS | Status: AC | PRN
  Administered 2023-10-09: 500 [IU]

## 2023-10-09 MED ORDER — SODIUM CHLORIDE 0.9 % IV SOLN
480.0000 mg | Freq: Once | INTRAVENOUS | Status: AC
  Administered 2023-10-09: 480 mg via INTRAVENOUS
  Filled 2023-10-09: qty 48

## 2023-10-09 NOTE — Patient Instructions (Signed)
Nivolumab Injection What is this medication? NIVOLUMAB (nye VOL ue mab) treats some types of cancer. It works by helping your immune system slow or stop the spread of cancer cells. It is a monoclonal antibody. This medicine may be used for other purposes; ask your health care provider or pharmacist if you have questions. COMMON BRAND NAME(S): Opdivo What should I tell my care team before I take this medication? They need to know if you have any of these conditions: Allogeneic stem cell transplant (uses someone else's stem cells) Autoimmune diseases, such as Crohn disease, ulcerative colitis, lupus History of chest radiation Nervous system problems, such as Guillain-Barre syndrome or myasthenia gravis Organ transplant An unusual or allergic reaction to nivolumab, other medications, foods, dyes, or preservatives Pregnant or trying to get pregnant Breast-feeding How should I use this medication? This medication is infused into a vein. It is given in a hospital or clinic setting. A special MedGuide will be given to you before each treatment. Be sure to read this information carefully each time. Talk to your care team about the use of this medication in children. While it may be prescribed for children as young as 12 years for selected conditions, precautions do apply. Overdosage: If you think you have taken too much of this medicine contact a poison control center or emergency room at once. NOTE: This medicine is only for you. Do not share this medicine with others. What if I miss a dose? Keep appointments for follow-up doses. It is important not to miss your dose. Call your care team if you are unable to keep an appointment. What may interact with this medication? Interactions have not been studied. This list may not describe all possible interactions. Give your health care provider a list of all the medicines, herbs, non-prescription drugs, or dietary supplements you use. Also tell them if you  smoke, drink alcohol, or use illegal drugs. Some items may interact with your medicine. What should I watch for while using this medication? Your condition will be monitored carefully while you are receiving this medication. You may need blood work while taking this medication. This medication may cause serious skin reactions. They can happen weeks to months after starting the medication. Contact your care team right away if you notice fevers or flu-like symptoms with a rash. The rash may be red or purple and then turn into blisters or peeling of the skin. You may also notice a red rash with swelling of the face, lips, or lymph nodes in your neck or under your arms. Tell your care team right away if you have any change in your eyesight. Talk to your care team if you are pregnant or think you might be pregnant. A negative pregnancy test is required before starting this medication. A reliable form of contraception is recommended while taking this medication and for 5 months after the last dose. Talk to your care team about effective forms of contraception. Do not breast-feed while taking this medication and for 5 months after the last dose. What side effects may I notice from receiving this medication? Side effects that you should report to your care team as soon as possible: Allergic reactions--skin rash, itching, hives, swelling of the face, lips, tongue, or throat Dry cough, shortness of breath or trouble breathing Eye pain, redness, irritation, or discharge with blurry or decreased vision Heart muscle inflammation--unusual weakness or fatigue, shortness of breath, chest pain, fast or irregular heartbeat, dizziness, swelling of the ankles, feet, or hands Hormone   gland problems--headache, sensitivity to light, unusual weakness or fatigue, dizziness, fast or irregular heartbeat, increased sensitivity to cold or heat, excessive sweating, constipation, hair loss, increased thirst or amount of urine,  tremors or shaking, irritability Infusion reactions--chest pain, shortness of breath or trouble breathing, feeling faint or lightheaded Kidney injury (glomerulonephritis)--decrease in the amount of urine, red or dark brown urine, foamy or bubbly urine, swelling of the ankles, hands, or feet Liver injury--right upper belly pain, loss of appetite, nausea, light-colored stool, dark yellow or brown urine, yellowing skin or eyes, unusual weakness or fatigue Pain, tingling, or numbness in the hands or feet, muscle weakness, change in vision, confusion or trouble speaking, loss of balance or coordination, trouble walking, seizures Rash, fever, and swollen lymph nodes Redness, blistering, peeling, or loosening of the skin, including inside the mouth Sudden or severe stomach pain, bloody diarrhea, fever, nausea, vomiting Side effects that usually do not require medical attention (report these to your care team if they continue or are bothersome): Bone, joint, or muscle pain Diarrhea Fatigue Loss of appetite Nausea Skin rash This list may not describe all possible side effects. Call your doctor for medical advice about side effects. You may report side effects to FDA at 1-800-FDA-1088. Where should I keep my medication? This medication is given in a hospital or clinic. It will not be stored at home. NOTE: This sheet is a summary. It may not cover all possible information. If you have questions about this medicine, talk to your doctor, pharmacist, or health care provider.  2024 Elsevier/Gold Standard (2022-04-08 00:00:00)  

## 2023-10-16 ENCOUNTER — Encounter: Payer: Self-pay | Admitting: Oncology

## 2023-10-16 DIAGNOSIS — H8109 Meniere's disease, unspecified ear: Secondary | ICD-10-CM | POA: Diagnosis not present

## 2023-10-29 ENCOUNTER — Encounter: Payer: Self-pay | Admitting: Oncology

## 2023-11-05 NOTE — Progress Notes (Signed)
[facility]  [address] [place],  [place]  [address] [phone]  Clinic Day:  11/06/2023  Referring physician: Lise Auer, MD  HISTORY OF PRESENT ILLNESS:  The patient is a 72 y.o. female with metastatic renal cell carcinoma, proven per cytology from a right-sided thoracentesis in early December 2021.  She comes in today to be evaluated before heading into her 33rd cycle of maintenance nivolumab. The patient tolerated her 32nd cycle well without having any significant difficulties. Of note, this patient's maintenance nivolumab was preceded by 3 cycles of nivolumab/ipilimumab; a 4th cycle was not given due to her being hospitalized for severe colitis related to the ipilimumab component of her combination immunotherapy.  Since her last visit, the patient has been doing okay.  Other than occasional shortness of breath, she denies having other respiratory symptoms which concern her for disease progression.     PHYSICAL EXAM:  Blood pressure 122/69, pulse 77, temperature 97.9 F (36.6 C), resp. rate 14, height 5\' 1"  (1.549 m), weight 147 lb 1.6 oz (66.7 kg), SpO2 95%. Wt Readings from Last 3 Encounters:  11/06/23 147 lb 1.6 oz (66.7 kg)  10/09/23 146 lb 1.9 oz (66.3 kg)  10/06/23 147 lb 3.2 oz (66.8 kg)   Body mass index is 27.79 kg/m.  Performance status (ECOG): 1 - Symptomatic but completely ambulatory  Physical Exam Vitals and nursing note reviewed.  Constitutional:      General: She is not in acute distress.    Appearance: Normal appearance.  HENT:     Head: Normocephalic and atraumatic.     Mouth/Throat:     Mouth: Mucous membranes are moist.     Pharynx: Oropharynx is clear. No oropharyngeal exudate or posterior oropharyngeal erythema.  Eyes:     General: No scleral icterus.    Extraocular Movements: Extraocular movements intact.     Conjunctiva/sclera: Conjunctivae normal.     Pupils: Pupils are equal, round, and reactive to light.   Cardiovascular:     Rate and Rhythm: Normal rate and regular rhythm.     Heart sounds: Normal heart sounds. No murmur heard.    No friction rub. No gallop.  Pulmonary:     Effort: Pulmonary effort is normal.     Breath sounds: Examination of the right-lower field reveals decreased breath sounds. Decreased breath sounds present. No wheezing, rhonchi or rales.  Chest:     Chest wall: No lacerations, swelling, tenderness or crepitus.  Abdominal:     General: There is no distension.     Palpations: Abdomen is soft. There is no hepatomegaly, splenomegaly or mass.     Tenderness: There is no abdominal tenderness.  Musculoskeletal:        General: Normal range of motion.     Cervical back: Normal range of motion and neck supple. No tenderness.     Right lower leg: No edema.     Left lower leg: No edema.  Lymphadenopathy:     Cervical: No cervical adenopathy.     Upper Body:     Right upper body: No supraclavicular or axillary adenopathy.     Left upper body: No supraclavicular or axillary adenopathy.     Lower Body: No right inguinal adenopathy. No left inguinal adenopathy.  Skin:    General: Skin is warm and dry.     Coloration: Skin is not jaundiced.     Findings: No ecchymosis or rash.  Neurological:     Mental Status: She is  alert and oriented to person, place, and time.     Cranial Nerves: No cranial nerve deficit.  Psychiatric:        Mood and Affect: Mood normal.        Behavior: Behavior normal.        Thought Content: Thought content normal.    LABS:    Latest Reference Range & Units 11/06/23 14:18  Sodium 135 - 145 mmol/L 139  Potassium 3.5 - 5.1 mmol/L 4.0  Chloride 98 - 111 mmol/L 103  CO2 22 - 32 mmol/L 24  Glucose 70 - 99 mg/dL 295 (H)  BUN 8 - 23 mg/dL 17  Creatinine 6.21 - 3.08 mg/dL 6.57  Calcium 8.9 - 84.6 mg/dL 9.6  Anion gap 5 - 15  13  Alkaline Phosphatase 38 - 126 U/L 77  Albumin 3.5 - 5.0 g/dL 4.6  AST 15 - 41 U/L 29  ALT 0 - 44 U/L 25  Total  Protein 6.5 - 8.1 g/dL 7.0  Total Bilirubin <9.6 mg/dL 0.3  GFR, Est Non African American >60 mL/min >60  WBC 4.0 - 10.5 K/uL 7.1  RBC 3.87 - 5.11 MIL/uL 4.23  Hemoglobin 12.0 - 15.0 g/dL 29.5  HCT 28.4 - 13.2 % 40.2  MCV 80.0 - 100.0 fL 95.0  MCH 26.0 - 34.0 pg 32.9  MCHC 30.0 - 36.0 g/dL 44.0  RDW 10.2 - 72.5 % 13.7  Platelets 150 - 400 K/uL 210  nRBC 0.0 - 0.2 % 0 /100 WBC 0.00 0  Neutrophils % 60  Lymphocytes % 26  Monocytes Relative % 9  Eosinophil % 4  Basophil % 1  Immature Granulocytes % 0  NEUT# 1.7 - 7.7 K/uL 4.3  Lymphs Abs 0.7 - 4.0 K/uL 1.8  Monocyte # 0.1 - 1.0 K/uL 0.6  Eosinophils Absolute 0.0 - 0.5 K/uL 0.3  Basophils Absolute 0.0 - 0.1 K/uL 0.1  Abs Immature Granulocytes 0.00 - 0.07 K/uL 0.01  TSH 0.350 - 4.500 uIU/mL 1.217  Thyroxine (T4) 4.5 - 12.0 ug/dL 5.0  (H): Data is abnormally high  ASSESSMENT & PLAN:  Assessment/Plan:  72 y.o. female with metastatic renal cell carcinoma.  She will proceed with her 33rd cycle of maintenance nivolumab immunotherapy later this week.  Clinically, the patient continues to do well.  I will see her back in 4 weeks before she heads into her 34th cycle of maintenance nivolumab.  The patient understands all the plans discussed today and is in agreement with them.    Ferrah Panagopoulos Kirby Funk, MD

## 2023-11-06 ENCOUNTER — Inpatient Hospital Stay: Payer: Medicare PPO

## 2023-11-06 ENCOUNTER — Encounter: Payer: Self-pay | Admitting: Oncology

## 2023-11-06 ENCOUNTER — Inpatient Hospital Stay: Payer: Medicare PPO | Attending: Oncology | Admitting: Oncology

## 2023-11-06 VITALS — Resp 18

## 2023-11-06 VITALS — BP 122/69 | HR 77 | Temp 97.9°F | Resp 14 | Ht 61.0 in | Wt 147.1 lb

## 2023-11-06 DIAGNOSIS — D3001 Benign neoplasm of right kidney: Secondary | ICD-10-CM

## 2023-11-06 DIAGNOSIS — Z5112 Encounter for antineoplastic immunotherapy: Secondary | ICD-10-CM | POA: Insufficient documentation

## 2023-11-06 DIAGNOSIS — C641 Malignant neoplasm of right kidney, except renal pelvis: Secondary | ICD-10-CM | POA: Diagnosis not present

## 2023-11-06 DIAGNOSIS — Z79620 Long term (current) use of immunosuppressive biologic: Secondary | ICD-10-CM | POA: Diagnosis not present

## 2023-11-06 LAB — CBC WITH DIFFERENTIAL (CANCER CENTER ONLY)
Abs Immature Granulocytes: 0.01 10*3/uL (ref 0.00–0.07)
Basophils Absolute: 0.1 10*3/uL (ref 0.0–0.1)
Basophils Relative: 1 %
Eosinophils Absolute: 0.3 10*3/uL (ref 0.0–0.5)
Eosinophils Relative: 4 %
HCT: 40.2 % (ref 36.0–46.0)
Hemoglobin: 13.9 g/dL (ref 12.0–15.0)
Immature Granulocytes: 0 %
Lymphocytes Relative: 26 %
Lymphs Abs: 1.8 10*3/uL (ref 0.7–4.0)
MCH: 32.9 pg (ref 26.0–34.0)
MCHC: 34.6 g/dL (ref 30.0–36.0)
MCV: 95 fL (ref 80.0–100.0)
Monocytes Absolute: 0.6 10*3/uL (ref 0.1–1.0)
Monocytes Relative: 9 %
Neutro Abs: 4.3 10*3/uL (ref 1.7–7.7)
Neutrophils Relative %: 60 %
Platelet Count: 210 10*3/uL (ref 150–400)
RBC: 4.23 MIL/uL (ref 3.87–5.11)
RDW: 13.7 % (ref 11.5–15.5)
WBC Count: 7.1 10*3/uL (ref 4.0–10.5)
nRBC: 0 % (ref 0.0–0.2)
nRBC: 0 /100{WBCs}

## 2023-11-06 LAB — CMP (CANCER CENTER ONLY)
ALT: 25 U/L (ref 0–44)
AST: 29 U/L (ref 15–41)
Albumin: 4.6 g/dL (ref 3.5–5.0)
Alkaline Phosphatase: 77 U/L (ref 38–126)
Anion gap: 13 (ref 5–15)
BUN: 17 mg/dL (ref 8–23)
CO2: 24 mmol/L (ref 22–32)
Calcium: 9.6 mg/dL (ref 8.9–10.3)
Chloride: 103 mmol/L (ref 98–111)
Creatinine: 0.75 mg/dL (ref 0.44–1.00)
GFR, Estimated: >60 mL/min (ref >60)
Glucose, Bld: 122 mg/dL — ABNORMAL HIGH (ref 70–99)
Potassium: 4 mmol/L (ref 3.5–5.1)
Sodium: 139 mmol/L (ref 135–145)
Total Bilirubin: 0.3 mg/dL (ref <1.2)
Total Protein: 7 g/dL (ref 6.5–8.1)

## 2023-11-06 LAB — TSH: TSH: 1.217 u[IU]/mL (ref 0.350–4.500)

## 2023-11-06 MED ORDER — SODIUM CHLORIDE 0.9% FLUSH
10.0000 mL | INTRAVENOUS | Status: DC | PRN
  Administered 2023-11-06: 10 mL

## 2023-11-06 MED ORDER — HEPARIN SOD (PORK) LOCK FLUSH 100 UNIT/ML IV SOLN
500.0000 [IU] | Freq: Once | INTRAVENOUS | Status: AC | PRN
  Administered 2023-11-06: 500 [IU]

## 2023-11-06 MED ORDER — SODIUM CHLORIDE 0.9 % IV SOLN
480.0000 mg | Freq: Once | INTRAVENOUS | Status: AC
  Administered 2023-11-06: 480 mg via INTRAVENOUS
  Filled 2023-11-06: qty 48

## 2023-11-06 MED ORDER — SODIUM CHLORIDE 0.9 % IV SOLN: Freq: Once | INTRAVENOUS | Status: AC

## 2023-11-06 NOTE — Patient Instructions (Signed)
Nivolumab Injection What is this medication? NIVOLUMAB (nye VOL ue mab) treats some types of cancer. It works by helping your immune system slow or stop the spread of cancer cells. It is a monoclonal antibody. This medicine may be used for other purposes; ask your health care provider or pharmacist if you have questions. COMMON BRAND NAME(S): Opdivo What should I tell my care team before I take this medication? They need to know if you have any of these conditions: Allogeneic stem cell transplant (uses someone else's stem cells) Autoimmune diseases, such as Crohn disease, ulcerative colitis, lupus History of chest radiation Nervous system problems, such as Guillain-Barre syndrome or myasthenia gravis Organ transplant An unusual or allergic reaction to nivolumab, other medications, foods, dyes, or preservatives Pregnant or trying to get pregnant Breast-feeding How should I use this medication? This medication is infused into a vein. It is given in a hospital or clinic setting. A special MedGuide will be given to you before each treatment. Be sure to read this information carefully each time. Talk to your care team about the use of this medication in children. While it may be prescribed for children as young as 12 years for selected conditions, precautions do apply. Overdosage: If you think you have taken too much of this medicine contact a poison control center or emergency room at once. NOTE: This medicine is only for you. Do not share this medicine with others. What if I miss a dose? Keep appointments for follow-up doses. It is important not to miss your dose. Call your care team if you are unable to keep an appointment. What may interact with this medication? Interactions have not been studied. This list may not describe all possible interactions. Give your health care provider a list of all the medicines, herbs, non-prescription drugs, or dietary supplements you use. Also tell them if you  smoke, drink alcohol, or use illegal drugs. Some items may interact with your medicine. What should I watch for while using this medication? Your condition will be monitored carefully while you are receiving this medication. You may need blood work while taking this medication. This medication may cause serious skin reactions. They can happen weeks to months after starting the medication. Contact your care team right away if you notice fevers or flu-like symptoms with a rash. The rash may be red or purple and then turn into blisters or peeling of the skin. You may also notice a red rash with swelling of the face, lips, or lymph nodes in your neck or under your arms. Tell your care team right away if you have any change in your eyesight. Talk to your care team if you are pregnant or think you might be pregnant. A negative pregnancy test is required before starting this medication. A reliable form of contraception is recommended while taking this medication and for 5 months after the last dose. Talk to your care team about effective forms of contraception. Do not breast-feed while taking this medication and for 5 months after the last dose. What side effects may I notice from receiving this medication? Side effects that you should report to your care team as soon as possible: Allergic reactions--skin rash, itching, hives, swelling of the face, lips, tongue, or throat Dry cough, shortness of breath or trouble breathing Eye pain, redness, irritation, or discharge with blurry or decreased vision Heart muscle inflammation--unusual weakness or fatigue, shortness of breath, chest pain, fast or irregular heartbeat, dizziness, swelling of the ankles, feet, or hands Hormone   gland problems--headache, sensitivity to light, unusual weakness or fatigue, dizziness, fast or irregular heartbeat, increased sensitivity to cold or heat, excessive sweating, constipation, hair loss, increased thirst or amount of urine,  tremors or shaking, irritability Infusion reactions--chest pain, shortness of breath or trouble breathing, feeling faint or lightheaded Kidney injury (glomerulonephritis)--decrease in the amount of urine, red or dark brown urine, foamy or bubbly urine, swelling of the ankles, hands, or feet Liver injury--right upper belly pain, loss of appetite, nausea, light-colored stool, dark yellow or brown urine, yellowing skin or eyes, unusual weakness or fatigue Pain, tingling, or numbness in the hands or feet, muscle weakness, change in vision, confusion or trouble speaking, loss of balance or coordination, trouble walking, seizures Rash, fever, and swollen lymph nodes Redness, blistering, peeling, or loosening of the skin, including inside the mouth Sudden or severe stomach pain, bloody diarrhea, fever, nausea, vomiting Side effects that usually do not require medical attention (report these to your care team if they continue or are bothersome): Bone, joint, or muscle pain Diarrhea Fatigue Loss of appetite Nausea Skin rash This list may not describe all possible side effects. Call your doctor for medical advice about side effects. You may report side effects to FDA at 1-800-FDA-1088. Where should I keep my medication? This medication is given in a hospital or clinic. It will not be stored at home. NOTE: This sheet is a summary. It may not cover all possible information. If you have questions about this medicine, talk to your doctor, pharmacist, or health care provider.  2024 Elsevier/Gold Standard (2022-04-08 00:00:00)  

## 2023-11-07 ENCOUNTER — Other Ambulatory Visit: Payer: Self-pay

## 2023-11-07 LAB — T4: T4, Total: 5 ug/dL (ref 4.5–12.0)

## 2023-11-29 ENCOUNTER — Other Ambulatory Visit: Payer: Self-pay

## 2023-12-01 ENCOUNTER — Encounter: Payer: Self-pay | Admitting: Oncology

## 2023-12-03 NOTE — Progress Notes (Unsigned)
[facility]  [address] [place],  [place]  [address] [phone]  Clinic Day:  12/04/2023  Referring physician: Lise Auer, MD  HISTORY OF PRESENT ILLNESS:  The patient is a 72 y.o. female with metastatic renal cell carcinoma, proven per cytology from a right-sided thoracentesis in early December 2021.  She comes in today to be evaluated before heading into her 34th cycle of maintenance nivolumab. The patient tolerated her 33rd cycle well without having any significant difficulties. Of note, this patient's maintenance nivolumab was preceded by 3 cycles of nivolumab/ipilimumab; a 4th cycle was not given due to her being hospitalized for severe colitis related to the ipilimumab component of her combination immunotherapy.  Since her last visit, the patient has been doing okay.  Other than occasional shortness of breath, she denies having other respiratory symptoms which concern her for disease progression.     PHYSICAL EXAM:  There were no vitals taken for this visit. Wt Readings from Last 3 Encounters:  11/06/23 147 lb 1.6 oz (66.7 kg)  10/09/23 146 lb 1.9 oz (66.3 kg)  10/06/23 147 lb 3.2 oz (66.8 kg)   There is no height or weight on file to calculate BMI.  Performance status (ECOG): 1 - Symptomatic but completely ambulatory  Physical Exam Vitals and nursing note reviewed.  Constitutional:      General: She is not in acute distress.    Appearance: Normal appearance.  HENT:     Head: Normocephalic and atraumatic.     Mouth/Throat:     Mouth: Mucous membranes are moist.     Pharynx: Oropharynx is clear. No oropharyngeal exudate or posterior oropharyngeal erythema.  Eyes:     General: No scleral icterus.    Extraocular Movements: Extraocular movements intact.     Conjunctiva/sclera: Conjunctivae normal.     Pupils: Pupils are equal, round, and reactive to light.  Cardiovascular:     Rate and Rhythm: Normal rate and regular rhythm.      Heart sounds: Normal heart sounds. No murmur heard.    No friction rub. No gallop.  Pulmonary:     Effort: Pulmonary effort is normal.     Breath sounds: Examination of the right-lower field reveals decreased breath sounds. Decreased breath sounds present. No wheezing, rhonchi or rales.  Chest:     Chest wall: No lacerations, swelling, tenderness or crepitus.  Abdominal:     General: There is no distension.     Palpations: Abdomen is soft. There is no hepatomegaly, splenomegaly or mass.     Tenderness: There is no abdominal tenderness.  Musculoskeletal:        General: Normal range of motion.     Cervical back: Normal range of motion and neck supple. No tenderness.     Right lower leg: No edema.     Left lower leg: No edema.  Lymphadenopathy:     Cervical: No cervical adenopathy.     Upper Body:     Right upper body: No supraclavicular or axillary adenopathy.     Left upper body: No supraclavicular or axillary adenopathy.     Lower Body: No right inguinal adenopathy. No left inguinal adenopathy.  Skin:    General: Skin is warm and dry.     Coloration: Skin is not jaundiced.     Findings: No ecchymosis or rash.  Neurological:     Mental Status: She is alert and oriented to person, place, and time.     Cranial Nerves:  No cranial nerve deficit.  Psychiatric:        Mood and Affect: Mood normal.        Behavior: Behavior normal.        Thought Content: Thought content normal.    LABS:    Latest Reference Range & Units 11/06/23 14:18  Sodium 135 - 145 mmol/L 139  Potassium 3.5 - 5.1 mmol/L 4.0  Chloride 98 - 111 mmol/L 103  CO2 22 - 32 mmol/L 24  Glucose 70 - 99 mg/dL 161 (H)  BUN 8 - 23 mg/dL 17  Creatinine 0.96 - 0.45 mg/dL 4.09  Calcium 8.9 - 81.1 mg/dL 9.6  Anion gap 5 - 15  13  Alkaline Phosphatase 38 - 126 U/L 77  Albumin 3.5 - 5.0 g/dL 4.6  AST 15 - 41 U/L 29  ALT 0 - 44 U/L 25  Total Protein 6.5 - 8.1 g/dL 7.0  Total Bilirubin <9.1 mg/dL 0.3  GFR, Est Non  African American >60 mL/min >60  WBC 4.0 - 10.5 K/uL 7.1  RBC 3.87 - 5.11 MIL/uL 4.23  Hemoglobin 12.0 - 15.0 g/dL 47.8  HCT 29.5 - 62.1 % 40.2  MCV 80.0 - 100.0 fL 95.0  MCH 26.0 - 34.0 pg 32.9  MCHC 30.0 - 36.0 g/dL 30.8  RDW 65.7 - 84.6 % 13.7  Platelets 150 - 400 K/uL 210  nRBC 0.0 - 0.2 % 0 /100 WBC 0.00 0  Neutrophils % 60  Lymphocytes % 26  Monocytes Relative % 9  Eosinophil % 4  Basophil % 1  Immature Granulocytes % 0  NEUT# 1.7 - 7.7 K/uL 4.3  Lymphs Abs 0.7 - 4.0 K/uL 1.8  Monocyte # 0.1 - 1.0 K/uL 0.6  Eosinophils Absolute 0.0 - 0.5 K/uL 0.3  Basophils Absolute 0.0 - 0.1 K/uL 0.1  Abs Immature Granulocytes 0.00 - 0.07 K/uL 0.01  TSH 0.350 - 4.500 uIU/mL 1.217  Thyroxine (T4) 4.5 - 12.0 ug/dL 5.0  (H): Data is abnormally high  ASSESSMENT & PLAN:  Assessment/Plan:  72 y.o. female with metastatic renal cell carcinoma.  She will proceed with her 33rd cycle of maintenance nivolumab immunotherapy later this week.  Clinically, the patient continues to do well.  I will see her back in 4 weeks before she heads into her 34th cycle of maintenance nivolumab.  The patient understands all the plans discussed today and is in agreement with them.    Tarron Krolak Kirby Funk, MD

## 2023-12-04 ENCOUNTER — Inpatient Hospital Stay: Payer: Medicare PPO

## 2023-12-04 ENCOUNTER — Telehealth: Payer: Self-pay | Admitting: Oncology

## 2023-12-04 ENCOUNTER — Inpatient Hospital Stay: Payer: Medicare PPO | Attending: Oncology | Admitting: Oncology

## 2023-12-04 VITALS — BP 107/74 | HR 72 | Temp 98.0°F | Resp 14 | Ht 61.0 in | Wt 145.9 lb

## 2023-12-04 VITALS — BP 172/72 | HR 69 | Temp 98.0°F | Resp 18

## 2023-12-04 DIAGNOSIS — D3001 Benign neoplasm of right kidney: Secondary | ICD-10-CM

## 2023-12-04 DIAGNOSIS — Z5112 Encounter for antineoplastic immunotherapy: Secondary | ICD-10-CM | POA: Diagnosis not present

## 2023-12-04 DIAGNOSIS — Z79620 Long term (current) use of immunosuppressive biologic: Secondary | ICD-10-CM | POA: Insufficient documentation

## 2023-12-04 DIAGNOSIS — C641 Malignant neoplasm of right kidney, except renal pelvis: Secondary | ICD-10-CM | POA: Diagnosis not present

## 2023-12-04 LAB — CBC WITH DIFFERENTIAL (CANCER CENTER ONLY)
Abs Immature Granulocytes: 0.02 10*3/uL (ref 0.00–0.07)
Basophils Absolute: 0.1 10*3/uL (ref 0.0–0.1)
Basophils Relative: 1 %
Eosinophils Absolute: 0.4 10*3/uL (ref 0.0–0.5)
Eosinophils Relative: 7 %
HCT: 38.1 % (ref 36.0–46.0)
Hemoglobin: 13.1 g/dL (ref 12.0–15.0)
Immature Granulocytes: 0 %
Lymphocytes Relative: 20 %
Lymphs Abs: 1.2 10*3/uL (ref 0.7–4.0)
MCH: 32.8 pg (ref 26.0–34.0)
MCHC: 34.4 g/dL (ref 30.0–36.0)
MCV: 95.5 fL (ref 80.0–100.0)
Monocytes Absolute: 0.7 10*3/uL (ref 0.1–1.0)
Monocytes Relative: 12 %
Neutro Abs: 3.5 10*3/uL (ref 1.7–7.7)
Neutrophils Relative %: 60 %
Platelet Count: 212 10*3/uL (ref 150–400)
RBC: 3.99 MIL/uL (ref 3.87–5.11)
RDW: 13.6 % (ref 11.5–15.5)
WBC Count: 5.9 10*3/uL (ref 4.0–10.5)
nRBC: 0 % (ref 0.0–0.2)
nRBC: 0 /100{WBCs}

## 2023-12-04 LAB — CMP (CANCER CENTER ONLY)
ALT: 22 U/L (ref 0–44)
AST: 27 U/L (ref 15–41)
Albumin: 4.1 g/dL (ref 3.5–5.0)
Alkaline Phosphatase: 86 U/L (ref 38–126)
Anion gap: 13 (ref 5–15)
BUN: 12 mg/dL (ref 8–23)
CO2: 23 mmol/L (ref 22–32)
Calcium: 9.2 mg/dL (ref 8.9–10.3)
Chloride: 104 mmol/L (ref 98–111)
Creatinine: 0.72 mg/dL (ref 0.44–1.00)
GFR, Estimated: >60 mL/min (ref >60)
Glucose, Bld: 120 mg/dL — ABNORMAL HIGH (ref 70–99)
Potassium: 3.7 mmol/L (ref 3.5–5.1)
Sodium: 140 mmol/L (ref 135–145)
Total Bilirubin: 0.4 mg/dL (ref <1.2)
Total Protein: 6.7 g/dL (ref 6.5–8.1)

## 2023-12-04 LAB — TSH: TSH: 2.04 u[IU]/mL (ref 0.350–4.500)

## 2023-12-04 MED ORDER — NIVOLUMAB CHEMO INJECTION 100 MG/10ML
480.0000 mg | Freq: Once | INTRAVENOUS | Status: AC
  Administered 2023-12-04: 480 mg via INTRAVENOUS
  Filled 2023-12-04: qty 48

## 2023-12-04 MED ORDER — HEPARIN SOD (PORK) LOCK FLUSH 100 UNIT/ML IV SOLN
500.0000 [IU] | Freq: Once | INTRAVENOUS | Status: AC | PRN
  Administered 2023-12-04: 500 [IU]

## 2023-12-04 MED ORDER — SODIUM CHLORIDE 0.9% FLUSH
10.0000 mL | INTRAVENOUS | Status: DC | PRN
  Administered 2023-12-04: 10 mL

## 2023-12-04 MED ORDER — SODIUM CHLORIDE 0.9 % IV SOLN: Freq: Once | INTRAVENOUS | Status: AC

## 2023-12-04 NOTE — Telephone Encounter (Signed)
Patient has been scheduled for follow-up visit per 12/04/23 LOS.  Pt given an appt calendar with date and time.

## 2023-12-04 NOTE — Patient Instructions (Signed)
[facility] [place] [facility]  Discharge Instructions: Thank you for choosing [facility] to provide your oncology and hematology care.  If you have a lab appointment with the Cancer Center, please go directly to the Cancer Center and check in at the registration area.   Wear comfortable clothing and clothing appropriate for easy access to any Portacath or PICC line.   We strive to give you quality time with your provider. You may need to reschedule your appointment if you arrive late (15 or more minutes).  Arriving late affects you and other patients whose appointments are after yours.  Also, if you miss three or more appointments without notifying the office, you may be dismissed from the clinic at the provider's discretion.      For prescription refill requests, have your pharmacy contact our office and allow 72 hours for refills to be completed.    Today you received the following chemotherapy and/or immunotherapy agents Opdivo      To help prevent nausea and vomiting after your treatment, we encourage you to take your nausea medication as directed.  BELOW ARE SYMPTOMS THAT SHOULD BE REPORTED IMMEDIATELY: *FEVER GREATER THAN 100.4 F (38 C) OR HIGHER *CHILLS OR SWEATING *NAUSEA AND VOMITING THAT IS NOT CONTROLLED WITH YOUR NAUSEA MEDICATION *UNUSUAL SHORTNESS OF BREATH *UNUSUAL BRUISING OR BLEEDING *URINARY PROBLEMS (pain or burning when urinating, or frequent urination) *BOWEL PROBLEMS (unusual diarrhea, constipation, pain near the anus) TENDERNESS IN MOUTH AND THROAT WITH OR WITHOUT PRESENCE OF ULCERS (sore throat, sores in mouth, or a toothache) UNUSUAL RASH, SWELLING OR PAIN  UNUSUAL VAGINAL DISCHARGE OR ITCHING   Items with * indicate a potential emergency and should be followed up as soon as possible or go to the Emergency Department if any problems should occur.  Please show the CHEMOTHERAPY ALERT CARD or IMMUNOTHERAPY ALERT  CARD at check-in to the Emergency Department and triage nurse.  Should you have questions after your visit or need to cancel or reschedule your appointment, please contact [facility] [place] - [facility]  Dept: [phone]  and follow the prompts.  Office hours are 8:00 a.m. to 4:30 p.m. Monday - Friday. Please note that voicemails left after 4:00 p.m. may not be returned until the following business day.  We are closed weekends and major holidays. You have access to a nurse at all times for urgent questions. Please call the main number to the clinic Dept: [phone] and follow the prompts.  For any non-urgent questions, you may also contact your provider using MyChart. We now offer e-Visits for anyone 46 and older to request care online for non-urgent symptoms. For details visit [email or web address].   Also download the MyChart app! Go to the app store, search "MyChart", open the app, select [place], and log in with your MyChart username and password.

## 2023-12-05 LAB — T4: T4, Total: 5.7 ug/dL (ref 4.5–12.0)

## 2023-12-06 ENCOUNTER — Other Ambulatory Visit: Payer: Self-pay

## 2023-12-18 DIAGNOSIS — R053 Chronic cough: Secondary | ICD-10-CM | POA: Diagnosis not present

## 2023-12-18 DIAGNOSIS — Z6825 Body mass index (BMI) 25.0-25.9, adult: Secondary | ICD-10-CM | POA: Diagnosis not present

## 2023-12-18 DIAGNOSIS — R040 Epistaxis: Secondary | ICD-10-CM | POA: Diagnosis not present

## 2023-12-18 DIAGNOSIS — C641 Malignant neoplasm of right kidney, except renal pelvis: Secondary | ICD-10-CM | POA: Diagnosis not present

## 2023-12-31 NOTE — Progress Notes (Deleted)
 [facility]  [address] [place],  [place]  [address] [phone]  Clinic Day:  12/31/2023  Referring physician: Fernand Tracey LABOR, MD  HISTORY OF PRESENT ILLNESS:  The patient is a 73 y.o. female with metastatic renal cell carcinoma, proven per cytology from a right-sided thoracentesis in early December 2021.  She comes in today to be evaluated before heading into her 35th cycle of maintenance nivolumab . The patient tolerated her 34th cycle well without having any significant difficulties. Of note, this patient's maintenance nivolumab  was preceded by 3 cycles of nivolumab /ipilimumab ; a 4th cycle was not given due to her being hospitalized for severe colitis related to the ipilimumab  component of her combination immunotherapy.  Since her last visit, the patient has been doing okay.  Other than occasional shortness of breath, she denies having any new respiratory symptoms which concern her for disease progression.     PHYSICAL EXAM:  There were no vitals taken for this visit. Wt Readings from Last 3 Encounters:  12/04/23 145 lb 14.4 oz (66.2 kg)  11/06/23 147 lb 1.6 oz (66.7 kg)  10/09/23 146 lb 1.9 oz (66.3 kg)   There is no height or weight on file to calculate BMI.  Performance status (ECOG): 1 - Symptomatic but completely ambulatory  Physical Exam Vitals and nursing note reviewed.  Constitutional:      General: She is not in acute distress.    Appearance: Normal appearance.  HENT:     Head: Normocephalic and atraumatic.     Mouth/Throat:     Mouth: Mucous membranes are moist.     Pharynx: Oropharynx is clear. No oropharyngeal exudate or posterior oropharyngeal erythema.  Eyes:     General: No scleral icterus.    Extraocular Movements: Extraocular movements intact.     Conjunctiva/sclera: Conjunctivae normal.     Pupils: Pupils are equal, round, and reactive to light.  Cardiovascular:     Rate and Rhythm: Normal rate and regular rhythm.      Heart sounds: Normal heart sounds. No murmur heard.    No friction rub. No gallop.  Pulmonary:     Effort: Pulmonary effort is normal.     Breath sounds: Examination of the right-lower field reveals decreased breath sounds. Decreased breath sounds present. No wheezing, rhonchi or rales.  Chest:     Chest wall: No lacerations, swelling, tenderness or crepitus.  Abdominal:     General: There is no distension.     Palpations: Abdomen is soft. There is no hepatomegaly, splenomegaly or mass.     Tenderness: There is no abdominal tenderness.  Musculoskeletal:        General: Normal range of motion.     Cervical back: Normal range of motion and neck supple. No tenderness.     Right lower leg: No edema.     Left lower leg: No edema.  Lymphadenopathy:     Cervical: No cervical adenopathy.     Upper Body:     Right upper body: No supraclavicular or axillary adenopathy.     Left upper body: No supraclavicular or axillary adenopathy.     Lower Body: No right inguinal adenopathy. No left inguinal adenopathy.  Skin:    General: Skin is warm and dry.     Coloration: Skin is not jaundiced.     Findings: No ecchymosis or rash.  Neurological:     Mental Status: She is alert and oriented to person, place, and time.     Cranial  Nerves: No cranial nerve deficit.  Psychiatric:        Mood and Affect: Mood normal.        Behavior: Behavior normal.        Thought Content: Thought content normal.    LABS:    Latest Reference Range & Units 12/04/23 09:34  Sodium 135 - 145 mmol/L 140  Potassium 3.5 - 5.1 mmol/L 3.7  Chloride 98 - 111 mmol/L 104  CO2 22 - 32 mmol/L 23  Glucose 70 - 99 mg/dL 879 (H)  BUN 8 - 23 mg/dL 12  Creatinine 9.55 - 8.99 mg/dL 9.27  Calcium 8.9 - 89.6 mg/dL 9.2  Anion gap 5 - 15  13  Alkaline Phosphatase 38 - 126 U/L 86  Albumin 3.5 - 5.0 g/dL 4.1  AST 15 - 41 U/L 27  ALT 0 - 44 U/L 22  Total Protein 6.5 - 8.1 g/dL 6.7  Total Bilirubin <8.7 mg/dL 0.4  GFR, Est Non  African American >60 mL/min >60  WBC 4.0 - 10.5 K/uL 5.9  RBC 3.87 - 5.11 MIL/uL 3.99  Hemoglobin 12.0 - 15.0 g/dL 86.8  HCT 63.9 - 53.9 % 38.1  MCV 80.0 - 100.0 fL 95.5  MCH 26.0 - 34.0 pg 32.8  MCHC 30.0 - 36.0 g/dL 65.5  RDW 88.4 - 84.4 % 13.6  Platelets 150 - 400 K/uL 212  nRBC 0.0 - 0.2 % 0 /100 WBC 0.00 0  Neutrophils % 60  Lymphocytes % 20  Monocytes Relative % 12  Eosinophil % 7  Basophil % 1  Immature Granulocytes % 0  NEUT# 1.7 - 7.7 K/uL 3.5  Lymphs Abs 0.7 - 4.0 K/uL 1.2  Monocyte # 0.1 - 1.0 K/uL 0.7  Eosinophils Absolute 0.0 - 0.5 K/uL 0.4  Basophils Absolute 0.0 - 0.1 K/uL 0.1  Abs Immature Granulocytes 0.00 - 0.07 K/uL 0.02  TSH 0.350 - 4.500 uIU/mL 2.040  (H): Data is abnormally high  ASSESSMENT & PLAN:  Assessment/Plan:  73 y.o. female with metastatic renal cell carcinoma.  She will proceed with her 34th cycle of maintenance nivolumab  immunotherapy later this week.  Clinically, the patient continues to do well.  I will see her back in 4 weeks before she heads into her 35th cycle of maintenance nivolumab .  The patient understands all the plans discussed today and is in agreement with them.    Nathaneil Feagans DELENA Kerns, MD

## 2024-01-01 ENCOUNTER — Inpatient Hospital Stay: Payer: Medicare PPO | Attending: Oncology

## 2024-01-01 ENCOUNTER — Other Ambulatory Visit: Payer: Self-pay | Admitting: Oncology

## 2024-01-01 ENCOUNTER — Inpatient Hospital Stay: Payer: Medicare PPO

## 2024-01-01 ENCOUNTER — Inpatient Hospital Stay (HOSPITAL_BASED_OUTPATIENT_CLINIC_OR_DEPARTMENT_OTHER): Payer: Medicare PPO | Admitting: Oncology

## 2024-01-01 VITALS — BP 156/72 | HR 73 | Temp 98.2°F | Resp 16 | Ht 61.0 in | Wt 143.5 lb

## 2024-01-01 DIAGNOSIS — C641 Malignant neoplasm of right kidney, except renal pelvis: Secondary | ICD-10-CM

## 2024-01-01 DIAGNOSIS — Z5112 Encounter for antineoplastic immunotherapy: Secondary | ICD-10-CM | POA: Insufficient documentation

## 2024-01-01 DIAGNOSIS — Z79620 Long term (current) use of immunosuppressive biologic: Secondary | ICD-10-CM | POA: Insufficient documentation

## 2024-01-01 DIAGNOSIS — D3001 Benign neoplasm of right kidney: Secondary | ICD-10-CM

## 2024-01-01 LAB — CBC WITH DIFFERENTIAL (CANCER CENTER ONLY)
Abs Immature Granulocytes: 0.01 10*3/uL (ref 0.00–0.07)
Basophils Absolute: 0.1 10*3/uL (ref 0.0–0.1)
Basophils Relative: 1 %
Eosinophils Absolute: 0.4 10*3/uL (ref 0.0–0.5)
Eosinophils Relative: 7 %
HCT: 40.2 % (ref 36.0–46.0)
Hemoglobin: 13.8 g/dL (ref 12.0–15.0)
Immature Granulocytes: 0 %
Lymphocytes Relative: 22 %
Lymphs Abs: 1.5 10*3/uL (ref 0.7–4.0)
MCH: 32.8 pg (ref 26.0–34.0)
MCHC: 34.3 g/dL (ref 30.0–36.0)
MCV: 95.5 fL (ref 80.0–100.0)
Monocytes Absolute: 0.7 10*3/uL (ref 0.1–1.0)
Monocytes Relative: 10 %
Neutro Abs: 3.9 10*3/uL (ref 1.7–7.7)
Neutrophils Relative %: 60 %
Platelet Count: 223 10*3/uL (ref 150–400)
RBC: 4.21 MIL/uL (ref 3.87–5.11)
RDW: 13.6 % (ref 11.5–15.5)
WBC Count: 6.6 10*3/uL (ref 4.0–10.5)
nRBC: 0 % (ref 0.0–0.2)
nRBC: 0 /100{WBCs}

## 2024-01-01 LAB — CMP (CANCER CENTER ONLY)
ALT: 23 U/L (ref 0–44)
AST: 32 U/L (ref 15–41)
Albumin: 4.7 g/dL (ref 3.5–5.0)
Alkaline Phosphatase: 91 U/L (ref 38–126)
Anion gap: 14 (ref 5–15)
BUN: 12 mg/dL (ref 8–23)
CO2: 23 mmol/L (ref 22–32)
Calcium: 9.5 mg/dL (ref 8.9–10.3)
Chloride: 103 mmol/L (ref 98–111)
Creatinine: 0.74 mg/dL (ref 0.44–1.00)
GFR, Estimated: >60 mL/min (ref >60)
Glucose, Bld: 113 mg/dL — ABNORMAL HIGH (ref 70–99)
Potassium: 4.1 mmol/L (ref 3.5–5.1)
Sodium: 140 mmol/L (ref 135–145)
Total Bilirubin: 0.4 mg/dL (ref 0.0–1.2)
Total Protein: 7.3 g/dL (ref 6.5–8.1)

## 2024-01-01 LAB — TSH: TSH: 2.428 u[IU]/mL (ref 0.350–4.500)

## 2024-01-01 MED ORDER — HEPARIN SOD (PORK) LOCK FLUSH 100 UNIT/ML IV SOLN
500.0000 [IU] | Freq: Once | INTRAVENOUS | Status: AC | PRN
Start: 2024-01-01 — End: 2024-01-01
  Administered 2024-01-01: 500 [IU]

## 2024-01-01 MED ORDER — SODIUM CHLORIDE 0.9 % IV SOLN
480.0000 mg | Freq: Once | INTRAVENOUS | Status: AC
  Administered 2024-01-01: 480 mg via INTRAVENOUS
  Filled 2024-01-01: qty 48

## 2024-01-01 MED ORDER — SODIUM CHLORIDE 0.9% FLUSH
10.0000 mL | INTRAVENOUS | Status: DC | PRN
  Administered 2024-01-01: 10 mL

## 2024-01-01 MED ORDER — SODIUM CHLORIDE 0.9 % IV SOLN: Freq: Once | INTRAVENOUS | Status: AC

## 2024-01-01 NOTE — Patient Instructions (Signed)
 [facility] [place] [facility]  Discharge Instructions: Thank you for choosing [facility] to provide your oncology and hematology care.  If you have a lab appointment with the Cancer Center, please go directly to the Cancer Center and check in at the registration area.   Wear comfortable clothing and clothing appropriate for easy access to any Portacath or PICC line.   We strive to give you quality time with your provider. You may need to reschedule your appointment if you arrive late (15 or more minutes).  Arriving late affects you and other patients whose appointments are after yours.  Also, if you miss three or more appointments without notifying the office, you may be dismissed from the clinic at the provider's discretion.      For prescription refill requests, have your pharmacy contact our office and allow 72 hours for refills to be completed.    Today you received the following chemotherapy and/or immunotherapy agents Opdivo      To help prevent nausea and vomiting after your treatment, we encourage you to take your nausea medication as directed.  BELOW ARE SYMPTOMS THAT SHOULD BE REPORTED IMMEDIATELY: *FEVER GREATER THAN 100.4 F (38 C) OR HIGHER *CHILLS OR SWEATING *NAUSEA AND VOMITING THAT IS NOT CONTROLLED WITH YOUR NAUSEA MEDICATION *UNUSUAL SHORTNESS OF BREATH *UNUSUAL BRUISING OR BLEEDING *URINARY PROBLEMS (pain or burning when urinating, or frequent urination) *BOWEL PROBLEMS (unusual diarrhea, constipation, pain near the anus) TENDERNESS IN MOUTH AND THROAT WITH OR WITHOUT PRESENCE OF ULCERS (sore throat, sores in mouth, or a toothache) UNUSUAL RASH, SWELLING OR PAIN  UNUSUAL VAGINAL DISCHARGE OR ITCHING   Items with * indicate a potential emergency and should be followed up as soon as possible or go to the Emergency Department if any problems should occur.  Please show the CHEMOTHERAPY ALERT CARD or IMMUNOTHERAPY ALERT  CARD at check-in to the Emergency Department and triage nurse.  Should you have questions after your visit or need to cancel or reschedule your appointment, please contact [facility] [place] [facility]  Dept: [phone]  and follow the prompts.  Office hours are 8:00 a.m. to 4:30 p.m. Monday - Friday. Please note that voicemails left after 4:00 p.m. may not be returned until the following business day.  We are closed weekends and major holidays. You have access to a nurse at all times for urgent questions. Please call the main number to the clinic Dept: [phone] and follow the prompts.  For any non-urgent questions, you may also contact your provider using MyChart. We now offer e-Visits for anyone 58 and older to request care online for non-urgent symptoms. For details visit [email or web address].   Also download the MyChart app! Go to the app store, search "MyChart", open the app, select [place], and log in with your MyChart username and password.

## 2024-01-01 NOTE — Progress Notes (Signed)
 [facility]  [address] [place],  [place]  [address] [phone]  Clinic Day:  01/01/2024  Referring physician: Fernand Tracey LABOR, MD   HISTORY OF PRESENT ILLNESS:  The patient is a 73 y.o. female with metastatic renal cell carcinoma, proven per cytology from a right-sided thoracentesis in early December 2021.  She comes in today to be evaluated before heading into her 35th cycle of maintenance nivolumab . The patient tolerated her 34th cycle well without having any significant difficulties.  Since her last visit, the patient has been doing fine.  Other than occasional shortness of breath and a cough, she denies having any new respiratory symptoms which concern her for disease progression.    Of note, this patient's maintenance nivolumab  was preceded by 3 cycles of nivolumab /ipilimumab .  A 4th cycle was not given due to her being hospitalized for severe colitis related to the ipilimumab  component of her combination immunotherapy.     PHYSICAL EXAM:  Blood pressure (!) 156/72, pulse 73, temperature 98.2 F (36.8 C), temperature source Oral, resp. rate 16, height 5' 1 (1.549 m), weight 143 lb 8 oz (65.1 kg), SpO2 94%. Wt Readings from Last 3 Encounters:  01/01/24 143 lb 8 oz (65.1 kg)  12/04/23 145 lb 14.4 oz (66.2 kg)  11/06/23 147 lb 1.6 oz (66.7 kg)   Body mass index is 27.11 kg/m. Performance status (ECOG): 1 - Symptomatic but completely ambulatory Physical Exam Constitutional:      Appearance: Normal appearance. She is not ill-appearing.  HENT:     Mouth/Throat:     Mouth: Mucous membranes are moist.     Pharynx: Oropharynx is clear. No oropharyngeal exudate or posterior oropharyngeal erythema.  Cardiovascular:     Rate and Rhythm: Normal rate and regular rhythm.     Heart sounds: No murmur heard.    No friction rub. No gallop.  Pulmonary:     Effort: Pulmonary effort is normal. No respiratory distress.     Breath sounds: Examination of the  right-lower field reveals decreased breath sounds. Decreased breath sounds present. No wheezing, rhonchi or rales.  Abdominal:     General: Bowel sounds are normal. There is no distension.     Palpations: Abdomen is soft. There is no mass.     Tenderness: There is no abdominal tenderness.  Musculoskeletal:        General: No swelling.     Right lower leg: No edema.     Left lower leg: No edema.  Lymphadenopathy:     Cervical: No cervical adenopathy.     Upper Body:     Right upper body: No supraclavicular or axillary adenopathy.     Left upper body: No supraclavicular or axillary adenopathy.     Lower Body: No right inguinal adenopathy. No left inguinal adenopathy.  Skin:    General: Skin is warm.     Coloration: Skin is not jaundiced.     Findings: No lesion or rash.  Neurological:     General: No focal deficit present.     Mental Status: She is alert and oriented to person, place, and time. Mental status is at baseline.  Psychiatric:        Mood and Affect: Mood normal.        Behavior: Behavior normal.        Thought Content: Thought content normal.     LABS:      Latest Ref Rng & Units 01/01/2024    9:38 AM  12/04/2023    9:34 AM 11/06/2023    2:18 PM  CBC  WBC 4.0 - 10.5 K/uL 6.6  5.9  7.1   Hemoglobin 12.0 - 15.0 g/dL 86.1  86.8  86.0   Hematocrit 36.0 - 46.0 % 40.2  38.1  40.2   Platelets 150 - 400 K/uL 223  212  210       Latest Ref Rng & Units 01/01/2024    9:38 AM 12/04/2023    9:34 AM 11/06/2023    2:18 PM  CMP  Glucose 70 - 99 mg/dL 886  879  877   BUN 8 - 23 mg/dL 12  12  17    Creatinine 0.44 - 1.00 mg/dL 9.25  9.27  9.24   Sodium 135 - 145 mmol/L 140  140  139   Potassium 3.5 - 5.1 mmol/L 4.1  3.7  4.0   Chloride 98 - 111 mmol/L 103  104  103   CO2 22 - 32 mmol/L 23  23  24    Calcium 8.9 - 10.3 mg/dL 9.5  9.2  9.6   Total Protein 6.5 - 8.1 g/dL 7.3  6.7  7.0   Total Bilirubin 0.0 - 1.2 mg/dL 0.4  0.4  0.3   Alkaline Phos 38 - 126 U/L 91  86  77    AST 15 - 41 U/L 32  27  29   ALT 0 - 44 U/L 23  22  25     Review Flowsheet       Latest Ref Rng & Units 11/28/2020  Oncology Labs  LDH 120 - 250 U/L 180    ASSESSMENT & PLAN:  Assessment/Plan:  A 73 y.o. female with metastatic renal cell carcinoma.  She will proceed with her 35th cycle of maintenance nivolumab  immunotherapy later this week.  Clinically, the patient continues to do well.  I will see her back in 4 weeks before she heads into her 36th cycle of maintenance nivolumab .  CT scans will be done a day before her next visit to ascertain her new disease baseline after 35 cycles of maintenance nivolumab .  The patient understands all the plans discussed today and is in agreement with them.    Kindra Bickham DELENA Kerns, MD

## 2024-01-03 ENCOUNTER — Other Ambulatory Visit: Payer: Self-pay

## 2024-01-03 LAB — T4: T4, Total: 7 ug/dL (ref 4.5–12.0)

## 2024-01-22 ENCOUNTER — Inpatient Hospital Stay: Payer: Medicare PPO

## 2024-01-22 ENCOUNTER — Ambulatory Visit (HOSPITAL_BASED_OUTPATIENT_CLINIC_OR_DEPARTMENT_OTHER)
Admission: RE | Admit: 2024-01-22 | Discharge: 2024-01-22 | Disposition: A | Discharge status: Home / Self Care | Payer: Medicare PPO | Source: Ambulatory Visit | Attending: Oncology | Admitting: Oncology

## 2024-01-22 DIAGNOSIS — Z79620 Long term (current) use of immunosuppressive biologic: Secondary | ICD-10-CM | POA: Diagnosis not present

## 2024-01-22 DIAGNOSIS — Z90710 Acquired absence of both cervix and uterus: Secondary | ICD-10-CM | POA: Diagnosis not present

## 2024-01-22 DIAGNOSIS — D3001 Benign neoplasm of right kidney: Secondary | ICD-10-CM

## 2024-01-22 DIAGNOSIS — C641 Malignant neoplasm of right kidney, except renal pelvis: Secondary | ICD-10-CM | POA: Diagnosis not present

## 2024-01-22 DIAGNOSIS — J90 Pleural effusion, not elsewhere classified: Secondary | ICD-10-CM | POA: Diagnosis not present

## 2024-01-22 DIAGNOSIS — Z5112 Encounter for antineoplastic immunotherapy: Secondary | ICD-10-CM | POA: Diagnosis not present

## 2024-01-22 DIAGNOSIS — R160 Hepatomegaly, not elsewhere classified: Secondary | ICD-10-CM | POA: Diagnosis not present

## 2024-01-22 LAB — CBC WITH DIFFERENTIAL (CANCER CENTER ONLY)
Abs Immature Granulocytes: 0.03 10*3/uL (ref 0.00–0.07)
Basophils Absolute: 0 10*3/uL (ref 0.0–0.1)
Basophils Relative: 1 %
Eosinophils Absolute: 0.4 10*3/uL (ref 0.0–0.5)
Eosinophils Relative: 6 %
HCT: 38.8 % (ref 36.0–46.0)
Hemoglobin: 13.3 g/dL (ref 12.0–15.0)
Immature Granulocytes: 1 %
Immature Platelet Fraction: 2.6 % (ref 1.2–8.6)
Lymphocytes Relative: 28 %
Lymphs Abs: 1.6 10*3/uL (ref 0.7–4.0)
MCH: 32.8 pg (ref 26.0–34.0)
MCHC: 34.3 g/dL (ref 30.0–36.0)
MCV: 95.6 fL (ref 80.0–100.0)
Monocytes Absolute: 0.5 10*3/uL (ref 0.1–1.0)
Monocytes Relative: 9 %
Neutro Abs: 3.2 10*3/uL (ref 1.7–7.7)
Neutrophils Relative %: 55 %
Platelet Count: 180 10*3/uL (ref 150–400)
RBC: 4.06 MIL/uL (ref 3.87–5.11)
RDW: 13.8 % (ref 11.5–15.5)
WBC Count: 5.7 10*3/uL (ref 4.0–10.5)
nRBC: 0 % (ref 0.0–0.2)
nRBC: 0 /100{WBCs}

## 2024-01-22 LAB — CMP (CANCER CENTER ONLY)
ALT: 23 U/L (ref 0–44)
AST: 29 U/L (ref 15–41)
Albumin: 4.5 g/dL (ref 3.5–5.0)
Alkaline Phosphatase: 73 U/L (ref 38–126)
Anion gap: 14 (ref 5–15)
BUN: 11 mg/dL (ref 8–23)
CO2: 23 mmol/L (ref 22–32)
Calcium: 9.2 mg/dL (ref 8.9–10.3)
Chloride: 104 mmol/L (ref 98–111)
Creatinine: 0.77 mg/dL (ref 0.44–1.00)
GFR, Estimated: >60 mL/min (ref >60)
Glucose, Bld: 125 mg/dL — ABNORMAL HIGH (ref 70–99)
Potassium: 3.7 mmol/L (ref 3.5–5.1)
Sodium: 140 mmol/L (ref 135–145)
Total Bilirubin: 0.3 mg/dL (ref 0.0–1.2)
Total Protein: 6.8 g/dL (ref 6.5–8.1)

## 2024-01-22 LAB — TSH: TSH: 1.666 u[IU]/mL (ref 0.350–4.500)

## 2024-01-22 MED ORDER — IOHEXOL 300 MG/ML  SOLN
100.0000 mL | Freq: Once | INTRAMUSCULAR | Status: AC | PRN
  Administered 2024-01-22: 100 mL via INTRAVENOUS

## 2024-01-23 LAB — T4: T4, Total: 5.7 ug/dL (ref 4.5–12.0)

## 2024-01-26 ENCOUNTER — Other Ambulatory Visit: Payer: Medicare PPO

## 2024-01-26 ENCOUNTER — Other Ambulatory Visit (HOSPITAL_BASED_OUTPATIENT_CLINIC_OR_DEPARTMENT_OTHER): Payer: Medicare PPO | Admitting: Radiology

## 2024-01-28 NOTE — Progress Notes (Signed)
 [facility]  [address] [place],  [place]  [address] [phone]  Clinic Day:  01/29/2024  Referring physician: Fernand Tracey LABOR, MD   HISTORY OF PRESENT ILLNESS:  The patient is a 73 y.o. female with metastatic renal cell carcinoma, proven per cytology from a right-sided thoracentesis in early December 2021.  She comes in today to go over her CT scans to ascertain her new disease baseline after 35 cycles of maintenance nivolumab . The patient tolerated her 35th cycle well without having any significant difficulties.  Other than occasional shortness of breath and a cough, she denies having any new respiratory or any systemic symptoms which concern her for disease progression.    Of note, this patient's maintenance nivolumab  was preceded by 3 cycles of nivolumab /ipilimumab .  A 4th cycle was not given due to her being hospitalized for severe colitis related to the ipilimumab  component of her combination immunotherapy.     PHYSICAL EXAM:  Blood pressure (!) 167/65, pulse 63, temperature 97.7 F (36.5 C), temperature source Oral, resp. rate 18, height 5' 1 (1.549 m), weight 145 lb 3.2 oz (65.9 kg), SpO2 96%. Wt Readings from Last 3 Encounters:  01/29/24 145 lb 3.2 oz (65.9 kg)  01/01/24 143 lb 8 oz (65.1 kg)  12/04/23 145 lb 14.4 oz (66.2 kg)   Body mass index is 27.44 kg/m. Performance status (ECOG): 1 - Symptomatic but completely ambulatory Physical Exam Constitutional:      Appearance: Normal appearance. She is not ill-appearing.  HENT:     Mouth/Throat:     Mouth: Mucous membranes are moist.     Pharynx: Oropharynx is clear. No oropharyngeal exudate or posterior oropharyngeal erythema.  Cardiovascular:     Rate and Rhythm: Normal rate and regular rhythm.     Heart sounds: No murmur heard.    No friction rub. No gallop.  Pulmonary:     Effort: Pulmonary effort is normal. No respiratory distress.     Breath sounds: Examination of the right-lower  field reveals decreased breath sounds. Decreased breath sounds present. No wheezing, rhonchi or rales.  Abdominal:     General: Bowel sounds are normal. There is no distension.     Palpations: Abdomen is soft. There is no mass.     Tenderness: There is no abdominal tenderness.  Musculoskeletal:        General: No swelling.     Right lower leg: No edema.     Left lower leg: No edema.  Lymphadenopathy:     Cervical: No cervical adenopathy.     Upper Body:     Right upper body: No supraclavicular or axillary adenopathy.     Left upper body: No supraclavicular or axillary adenopathy.     Lower Body: No right inguinal adenopathy. No left inguinal adenopathy.  Skin:    General: Skin is warm.     Coloration: Skin is not jaundiced.     Findings: No lesion or rash.  Neurological:     General: No focal deficit present.     Mental Status: She is alert and oriented to person, place, and time. Mental status is at baseline.  Psychiatric:        Mood and Affect: Mood normal.        Behavior: Behavior normal.        Thought Content: Thought content normal.    SCANS: CT scans of her chest/abdomen/pelvis revealed the following: FINDINGS: CT CHEST FINDINGS   Cardiovascular: No significant vascular findings.  Normal heart size. No pericardial effusion. Port in the anterior chest wall with tip in distal SVC.   Mediastinum/Nodes: No axillary or supraclavicular adenopathy. No mediastinal or hilar adenopathy. No pericardial fluid. Esophagus normal.   Lungs/Pleura: Large layering RIGHT pleural effusion is unchanged. Nodular thickening along the RIGHT pulmonary fissures again noted. Most prominent thickening measuring 11 mm by 6 mm (57/302) compares to 16 mm by 9 mm. Bilateral postsurgical resection margin in the upper lobes.   Musculoskeletal: No aggressive osseous lesion.   CT ABDOMEN AND PELVIS FINDINGS   Hepatobiliary: Along the posterosuperior RIGHT hepatic lobe, there are lobular  rounded masses not changed from comparison exams. These appear to be hepatic parenchyma on MRI of 09/28/2020.   No enhancing hepatic lesion.  Gallbladder normal.   Pancreas: Pancreas is normal. No ductal dilatation. No pancreatic inflammation.   Spleen: Normal spleen   Adrenals/urinary tract: Adrenal glands normal. Partially exophytic enhancing solid mass extending from the mid RIGHT kidney measures 43 x 34 mm compared to 47 x 36 mm. Visually the lesion appears unchanged. No new enhancing hepatic lesions. Ureters and bladder normal.   Stomach/Bowel: Stomach, small bowel, appendix, and cecum are normal. The colon and rectosigmoid colon are normal.   Vascular/Lymphatic: Abdominal aorta is normal caliber. There is no retroperitoneal or periportal lymphadenopathy. No pelvic lymphadenopathy.   Reproductive: Post hysterectomy.  Adnexa unremarkable   Other: No free fluid.   Musculoskeletal: Degenerative osteophytosis of the spine.   IMPRESSION: CHEST:   1. Stable large layering RIGHT pleural effusion. 2. Slight decrease in nodular thickening along the RIGHT pulmonary fissure. 3. No new pulmonary nodules.   PELVIS:   1. Stable enhancing mass extending from the mid RIGHT kidney. 2. No evidence of metastatic disease in the abdomen pelvis. 3. Stable lobular hepatic parenchyma extending from the superior RIGHT hepatic lobe into the RIGHT pleural effusion. No change from multiple comparison exams including MRI 2021. LABS:      Latest Ref Rng & Units 01/22/2024   10:14 AM 01/01/2024    9:38 AM 12/04/2023    9:34 AM  CBC  WBC 4.0 - 10.5 K/uL 5.7  6.6  5.9   Hemoglobin 12.0 - 15.0 g/dL 86.6  86.1  86.8   Hematocrit 36.0 - 46.0 % 38.8  40.2  38.1   Platelets 150 - 400 K/uL 180  223  212       Latest Ref Rng & Units 01/22/2024   10:14 AM 01/01/2024    9:38 AM 12/04/2023    9:34 AM  CMP  Glucose 70 - 99 mg/dL 874  886  879   BUN 8 - 23 mg/dL 11  12  12    Creatinine 0.44 - 1.00  mg/dL 9.22  9.25  9.27   Sodium 135 - 145 mmol/L 140  140  140   Potassium 3.5 - 5.1 mmol/L 3.7  4.1  3.7   Chloride 98 - 111 mmol/L 104  103  104   CO2 22 - 32 mmol/L 23  23  23    Calcium 8.9 - 10.3 mg/dL 9.2  9.5  9.2   Total Protein 6.5 - 8.1 g/dL 6.8  7.3  6.7   Total Bilirubin 0.0 - 1.2 mg/dL 0.3  0.4  0.4   Alkaline Phos 38 - 126 U/L 73  91  86   AST 15 - 41 U/L 29  32  27   ALT 0 - 44 U/L 23  23  22     ASSESSMENT &  PLAN:  Assessment/Plan:  A 73 y.o. female with metastatic renal cell carcinoma.  In clinic today, I went over all of her CT scan images with her, for which she could see that all of her disease remains under ideal control.  Her right pleural effusion remained stable in size; her right renal mass also remains stable.  Based upon this, she will proceed with her 36th cycle of maintenance nivolumab  immunotherapy today.  Clinically, the patient continues to do well.  I will see her back in 4 weeks before she heads into her 37th cycle of maintenance nivolumab .  The patient understands all the plans discussed today and is in agreement with them.    Jaselynn Tamas DELENA Kerns, MD

## 2024-01-29 ENCOUNTER — Inpatient Hospital Stay: Payer: Medicare PPO | Attending: Oncology | Admitting: Oncology

## 2024-01-29 ENCOUNTER — Inpatient Hospital Stay: Payer: Medicare PPO

## 2024-01-29 ENCOUNTER — Encounter: Payer: Self-pay | Admitting: Oncology

## 2024-01-29 ENCOUNTER — Ambulatory Visit
Admission: RE | Admit: 2024-01-29 | Discharge: 2024-01-29 | Disposition: A | Discharge status: Home / Self Care | Payer: Medicare PPO | Source: Ambulatory Visit | Attending: Radiation Oncology | Admitting: Radiation Oncology

## 2024-01-29 VITALS — BP 167/65 | HR 63 | Temp 97.7°F | Resp 18 | Ht 61.0 in | Wt 145.2 lb

## 2024-01-29 DIAGNOSIS — Z79620 Long term (current) use of immunosuppressive biologic: Secondary | ICD-10-CM | POA: Insufficient documentation

## 2024-01-29 DIAGNOSIS — Z5112 Encounter for antineoplastic immunotherapy: Secondary | ICD-10-CM | POA: Insufficient documentation

## 2024-01-29 DIAGNOSIS — C44212 Basal cell carcinoma of skin of right ear and external auricular canal: Secondary | ICD-10-CM | POA: Diagnosis not present

## 2024-01-29 DIAGNOSIS — D3001 Benign neoplasm of right kidney: Secondary | ICD-10-CM

## 2024-01-29 DIAGNOSIS — C641 Malignant neoplasm of right kidney, except renal pelvis: Secondary | ICD-10-CM

## 2024-01-29 MED ORDER — SODIUM CHLORIDE 0.9% FLUSH
10.0000 mL | INTRAVENOUS | Status: DC | PRN
  Administered 2024-01-29: 10 mL

## 2024-01-29 MED ORDER — HEPARIN SOD (PORK) LOCK FLUSH 100 UNIT/ML IV SOLN
500.0000 [IU] | Freq: Once | INTRAVENOUS | Status: AC | PRN
  Administered 2024-01-29: 500 [IU]

## 2024-01-29 MED ORDER — SODIUM CHLORIDE 0.9 % IV SOLN
480.0000 mg | Freq: Once | INTRAVENOUS | Status: AC
  Administered 2024-01-29: 480 mg via INTRAVENOUS
  Filled 2024-01-29: qty 48

## 2024-01-29 MED ORDER — SODIUM CHLORIDE 0.9 % IV SOLN: Freq: Once | INTRAVENOUS | Status: AC

## 2024-01-29 NOTE — Progress Notes (Signed)
 Radiation Oncology         [phone] ________________________________  Name: [name] [name]        MRN: [id number]  Date of Service: 01/29/2024 DOB: 01/10/1951  RR:Xyjw, Tracey LABOR, MD       REFERRING PHYSICIAN: Ezzard Peters, MD   DIAGNOSIS: Basal cell carcinoma of the right earlobe   HISTORY OF PRESENT ILLNESS: [name] [name] is a 73 y.o. female seen at the request of Dr. Ezzard.  She is currently followed by Dr. Ezzard for diagnosis of renal cell carcinoma, and continues to receive treatment.  Roughly a year and 1/2 to 2 years ago, she began to notice an enlarging firm nodule involving her right earlobe.  The area eventually became more painful, and firmer.  Recently, she noticed that it bleeds on occasion.  Her dermatologist in [place] performed a biopsy of the area, with pathology revealing basal cell carcinoma.  Of note, this pathology is not currently available for my review.  Per the patient, surgical resection of the lesion was not recommended.  Consultation is requested regarding the potential role of radiation in her care.    PREVIOUS RADIATION THERAPY: No   PAST MEDICAL HISTORY:  Past Medical History:  Diagnosis Date   Arthritis    Bilateral pneumothoraces    Carcinoma, basal cell, skin    Constipation    Hyperlipidemia    Squamous cell skin cancer    Vitamin B12 deficiency    Vitamin D deficiency        PAST SURGICAL HISTORY: Past Surgical History:  Procedure Laterality Date   ABDOMINAL HYSTERECTOMY     APPENDECTOMY     KNEE SURGERY Right    TOTAL REPLACEMENT   RECONSTRUCTION OF EYELID Bilateral    LOWER LID REPAIR POST CANCER EXCISION   TUBAL LIGATION     WISDOM TOOTH EXTRACTION       FAMILY HISTORY:  Family History  Problem Relation Age of Onset   Pancreatic cancer Mother    Breast cancer Mother    Melanoma Mother    Diabetes Father    Heart attack Father      SOCIAL HISTORY:  reports that she quit smoking about 23 years  ago. Her smoking use included cigarettes. She started smoking about 56 years ago. She has a 49.5 pack-year smoking history. She has never used smokeless tobacco. She reports current alcohol use. She reports that she does not currently use drugs.   ALLERGIES: Codeine, Meperidine, Gentamicin, Naphazoline, Neo-bacit-poly-lidocaine , Other, Polymyxin b, Pramoxine, Sulfa antibiotics, Bacitracin-neomycin-polymyxin, Bacitracin-polymyxin b, Dextrose, Fluorometholone, Metronidazole, Morphine and codeine, Neomycin-polymyxin-gramicidin, Oxycodone-aspirin, Prednisone, Tobramycin, and Tramadol hcl   MEDICATIONS:  Current Outpatient Medications  Medication Sig Dispense Refill   albuterol (VENTOLIN HFA) 108 (90 Base) MCG/ACT inhaler Inhale 2 puffs into the lungs every 4 (four) hours as needed.     amitriptyline (ELAVIL) 25 MG tablet Take 25 mg by mouth daily.     Calcium Carbonate-Vitamin D (CALCIUM-VITAMIN D) 600-125 MG-UNIT TABS Take 1 tablet by mouth 2 (two) times daily. (Patient not taking: Reported on 01/29/2024)     Cholecalciferol (VITAMIN D) 50 MCG (2000 UT) CAPS Take 2,000 Units by mouth daily.     citalopram (CELEXA) 20 MG tablet Take 20 mg by mouth daily.     clobetasol ointment (TEMOVATE) 0.05 % Apply topically.     famotidine  (PEPCID ) 20 MG tablet Take 20 mg by mouth daily.     Fluocinolone Acetonide 0.01 % OIL      gabapentin (  NEURONTIN) 100 MG capsule Take 200 mg by mouth at bedtime.     naproxen sodium (ALEVE) 220 MG tablet Take 220 mg by mouth 2 (two) times daily with a meal.     nivolumab  480 mg in sodium chloride  0.9 % 100 mL Inject into the vein.     rosuvastatin (CRESTOR) 10 MG tablet Take by mouth.     triamcinolone  cream (KENALOG ) 0.1 % Apply 1 Application topically 2 (two) times daily. 453.6 g 0   vitamin B-12 (CYANOCOBALAMIN ) 1000 MCG tablet Take 1,000 mcg by mouth daily.     No current facility-administered medications for this encounter.   Facility-Administered Medications Ordered  in Other Encounters  Medication Dose Route Frequency Provider Last Rate Last Admin   sodium chloride  flush (NS) 0.9 % injection 10 mL  10 mL Intracatheter PRN Lewis, Dequincy A, MD   10 mL at 01/29/24 1345     REVIEW OF SYSTEMS: She reports pain/tenderness involving the right earlobe lesion.  She is also noted scant bleeding at times.  She reports no fevers or chills.     PHYSICAL EXAM:  Wt Readings from Last 3 Encounters:  01/29/24 145 lb 3.2 oz (65.9 kg)  01/01/24 143 lb 8 oz (65.1 kg)  12/04/23 145 lb 14.4 oz (66.2 kg)   Temp Readings from Last 3 Encounters:  01/29/24 97.7 F (36.5 C) (Oral)  01/01/24 98.2 F (36.8 C) (Oral)  12/04/23 98 F (36.7 C) (Oral)   BP Readings from Last 3 Encounters:  01/29/24 (!) 167/65  01/01/24 (!) 156/72  12/04/23 (!) 172/72   Pulse Readings from Last 3 Encounters:  01/29/24 63  01/01/24 73  12/04/23 69    In general this is a well appearing female in no acute distress.  She's alert and oriented x4 and appropriate throughout the examination. Cardiopulmonary assessment is negative for acute distress and she exhibits normal effort.  Examination of her right earlobe reveals a roughly dime sized firm lesion, tender to the touch.  No drainage or bleeding is appreciated.     LABORATORY DATA:  Lab Results  Component Value Date   WBC 5.7 01/22/2024   HGB 13.3 01/22/2024   HCT 38.8 01/22/2024   MCV 95.6 01/22/2024   PLT 180 01/22/2024   Lab Results  Component Value Date   NA 140 01/22/2024   K 3.7 01/22/2024   CL 104 01/22/2024   CO2 23 01/22/2024   Lab Results  Component Value Date   ALT 23 01/22/2024   AST 29 01/22/2024   ALKPHOS 73 01/22/2024   BILITOT 0.3 01/22/2024      IMPRESSION/PLAN: 1.  The patient is a 73 year old female currently receiving treatment under the care of Dr. Ezzard for renal cell carcinoma.  She is currently bothered by increasingly painful basal cell carcinoma of the right earlobe.  Per her report,  surgical resection was not recommended.  I have therefore recommended that we proceed with definitive radiation to her right earlobe basal cell carcinoma; I reviewed the rationale behind this recommendation, as well as the potential side effects associated with radiation in her clinical scenario.  I explained that these may include, but are not limited to, skin irritation/itching, skin breakdown, and risk of permanent injury to the cartilage of the ear.  She expressed understanding, and is agreeable to proceed.  Will make arrangements for her to return to our department for simulation and treatment planning.   In a visit lasting 30 minutes, greater than 50% of the  time was spent face to face discussing the patient's condition, in preparation for the discussion, and coordinating the patient's care.    Emberlin Verner A. Jomarie, MD   **Disclaimer: This note was dictated with voice recognition software. Similar sounding words can inadvertently be transcribed and this note may contain transcription errors which may not have been corrected upon publication of note.**

## 2024-01-29 NOTE — Patient Instructions (Signed)
 Nivolumab Injection What is this medication? NIVOLUMAB (nye VOL ue mab) treats some types of cancer. It works by helping your immune system slow or stop the spread of cancer cells. It is a monoclonal antibody. This medicine may be used for other purposes; ask your health care provider or pharmacist if you have questions. COMMON BRAND NAME(S): Opdivo What should I tell my care team before I take this medication? They need to know if you have any of these conditions: Allogeneic stem cell transplant (uses someone else's stem cells) Autoimmune diseases, such as Crohn disease, ulcerative colitis, lupus History of chest radiation Nervous system problems, such as Guillain-Barre syndrome or myasthenia gravis Organ transplant An unusual or allergic reaction to nivolumab, other medications, foods, dyes, or preservatives Pregnant or trying to get pregnant Breast-feeding How should I use this medication? This medication is infused into a vein. It is given in a hospital or clinic setting. A special MedGuide will be given to you before each treatment. Be sure to read this information carefully each time. Talk to your care team about the use of this medication in children. While it may be prescribed for children as young as 12 years for selected conditions, precautions do apply. Overdosage: If you think you have taken too much of this medicine contact a poison control center or emergency room at once. NOTE: This medicine is only for you. Do not share this medicine with others. What if I miss a dose? Keep appointments for follow-up doses. It is important not to miss your dose. Call your care team if you are unable to keep an appointment. What may interact with this medication? Interactions have not been studied. This list may not describe all possible interactions. Give your health care provider a list of all the medicines, herbs, non-prescription drugs, or dietary supplements you use. Also tell them if you  smoke, drink alcohol, or use illegal drugs. Some items may interact with your medicine. What should I watch for while using this medication? Your condition will be monitored carefully while you are receiving this medication. You may need blood work while taking this medication. This medication may cause serious skin reactions. They can happen weeks to months after starting the medication. Contact your care team right away if you notice fevers or flu-like symptoms with a rash. The rash may be red or purple and then turn into blisters or peeling of the skin. You may also notice a red rash with swelling of the face, lips, or lymph nodes in your neck or under your arms. Tell your care team right away if you have any change in your eyesight. Talk to your care team if you are pregnant or think you might be pregnant. A negative pregnancy test is required before starting this medication. A reliable form of contraception is recommended while taking this medication and for 5 months after the last dose. Talk to your care team about effective forms of contraception. Do not breast-feed while taking this medication and for 5 months after the last dose. What side effects may I notice from receiving this medication? Side effects that you should report to your care team as soon as possible: Allergic reactions--skin rash, itching, hives, swelling of the face, lips, tongue, or throat Dry cough, shortness of breath or trouble breathing Eye pain, redness, irritation, or discharge with blurry or decreased vision Heart muscle inflammation--unusual weakness or fatigue, shortness of breath, chest pain, fast or irregular heartbeat, dizziness, swelling of the ankles, feet, or hands Hormone  gland problems--headache, sensitivity to light, unusual weakness or fatigue, dizziness, fast or irregular heartbeat, increased sensitivity to cold or heat, excessive sweating, constipation, hair loss, increased thirst or amount of urine,  tremors or shaking, irritability Infusion reactions--chest pain, shortness of breath or trouble breathing, feeling faint or lightheaded Kidney injury (glomerulonephritis)--decrease in the amount of urine, red or dark brown urine, foamy or bubbly urine, swelling of the ankles, hands, or feet Liver injury--right upper belly pain, loss of appetite, nausea, light-colored stool, dark yellow or brown urine, yellowing skin or eyes, unusual weakness or fatigue Pain, tingling, or numbness in the hands or feet, muscle weakness, change in vision, confusion or trouble speaking, loss of balance or coordination, trouble walking, seizures Rash, fever, and swollen lymph nodes Redness, blistering, peeling, or loosening of the skin, including inside the mouth Sudden or severe stomach pain, bloody diarrhea, fever, nausea, vomiting Side effects that usually do not require medical attention (report these to your care team if they continue or are bothersome): Bone, joint, or muscle pain Diarrhea Fatigue Loss of appetite Nausea Skin rash This list may not describe all possible side effects. Call your doctor for medical advice about side effects. You may report side effects to FDA at 1-800-FDA-1088. Where should I keep my medication? This medication is given in a hospital or clinic. It will not be stored at home. NOTE: This sheet is a summary. It may not cover all possible information. If you have questions about this medicine, talk to your doctor, pharmacist, or health care provider.  2024 Elsevier/Gold Standard (2022-04-08 00:00:00)

## 2024-01-30 ENCOUNTER — Other Ambulatory Visit: Payer: Self-pay

## 2024-02-11 ENCOUNTER — Other Ambulatory Visit: Payer: Self-pay

## 2024-02-11 ENCOUNTER — Ambulatory Visit
Admission: RE | Admit: 2024-02-11 | Discharge: 2024-02-11 | Disposition: A | Discharge status: Home / Self Care | Payer: Medicare PPO | Source: Ambulatory Visit | Attending: Radiation Oncology | Admitting: Radiation Oncology

## 2024-02-11 DIAGNOSIS — C44212 Basal cell carcinoma of skin of right ear and external auricular canal: Secondary | ICD-10-CM | POA: Diagnosis not present

## 2024-02-13 ENCOUNTER — Other Ambulatory Visit: Payer: Self-pay

## 2024-02-14 ENCOUNTER — Other Ambulatory Visit: Payer: Self-pay

## 2024-02-17 ENCOUNTER — Ambulatory Visit: Payer: Medicare PPO | Admitting: Radiation Oncology

## 2024-02-18 ENCOUNTER — Ambulatory Visit: Payer: Medicare PPO | Admitting: Radiation Oncology

## 2024-02-18 ENCOUNTER — Ambulatory Visit: Payer: Medicare PPO

## 2024-02-19 ENCOUNTER — Ambulatory Visit: Payer: Medicare PPO

## 2024-02-19 DIAGNOSIS — C44212 Basal cell carcinoma of skin of right ear and external auricular canal: Secondary | ICD-10-CM | POA: Diagnosis not present

## 2024-02-20 ENCOUNTER — Ambulatory Visit: Payer: Medicare PPO

## 2024-02-21 ENCOUNTER — Ambulatory Visit (HOSPITAL_BASED_OUTPATIENT_CLINIC_OR_DEPARTMENT_OTHER)
Admit: 2024-02-21 | Discharge: 2024-02-21 | Disposition: A | Discharge status: Home / Self Care | Attending: Family Medicine | Admitting: Radiology

## 2024-02-21 ENCOUNTER — Encounter (HOSPITAL_BASED_OUTPATIENT_CLINIC_OR_DEPARTMENT_OTHER): Payer: Self-pay | Admitting: Emergency Medicine

## 2024-02-21 ENCOUNTER — Ambulatory Visit (HOSPITAL_BASED_OUTPATIENT_CLINIC_OR_DEPARTMENT_OTHER)
Admission: EM | Admit: 2024-02-21 | Discharge: 2024-02-21 | Disposition: A | Discharge status: Home / Self Care | Attending: Family Medicine | Admitting: Family Medicine

## 2024-02-21 DIAGNOSIS — R0602 Shortness of breath: Secondary | ICD-10-CM

## 2024-02-21 DIAGNOSIS — R059 Cough, unspecified: Secondary | ICD-10-CM | POA: Diagnosis not present

## 2024-02-21 DIAGNOSIS — J9811 Atelectasis: Secondary | ICD-10-CM | POA: Diagnosis not present

## 2024-02-21 DIAGNOSIS — J90 Pleural effusion, not elsewhere classified: Secondary | ICD-10-CM | POA: Diagnosis not present

## 2024-02-21 DIAGNOSIS — R079 Chest pain, unspecified: Secondary | ICD-10-CM

## 2024-02-21 DIAGNOSIS — R051 Acute cough: Secondary | ICD-10-CM

## 2024-02-21 MED ORDER — BENZONATATE 100 MG PO CAPS: 100.0000 mg | ORAL_CAPSULE | Freq: Three times a day (TID) | ORAL | 0 refills | Status: DC | PRN

## 2024-02-21 NOTE — ED Triage Notes (Signed)
 Pt reports that she is a cancer patient here.  Pt had cough for a week, reports that mucous coming up is clear. Reports that her chest hurts when she coughs. Reports told to be on lookout for signs of PNA bc high chances with medication taking. Pt states that she knows she has fluid on lungs bc does have to get drained off when builds up.  Reports symptoms are worse in mornings. Pt states that she has allergies and knows the tree pollen has been high.

## 2024-02-21 NOTE — ED Provider Notes (Signed)
 [facility]    CSN: [id number] Arrival date & time: 02/21/24  1057      History   Chief Complaint Chief Complaint  Patient presents with   Cough    HPI [name] [name] is a 73 y.o. female.    Cough Here for cough it has been going on for about 1.  Is been getting worse.  She does have some mucus production though it is clear.  She also has some nasal drainage.  At least at first she felt it was may be due to allergies.  No fever  She has had some ear pressure.  Past medical history significant for renal cell carcinoma for which she is receiving Opdivo.  She has had right sided pleural effusions that collect in has had several thoracentesis procedures to draw that off.  She is not sure when the last one was.  She does also have a basal cell carcinoma on her right earlobe.  She is set to start radiation for that next week.  She is allergic to Neosporin, polymyxin B, sulfa antibiotics, and tobramycin and metronidazole, among other things.   Past Medical History:  Diagnosis Date   Arthritis    Bilateral pneumothoraces    Carcinoma, basal cell, skin    Constipation    Hyperlipidemia    Squamous cell skin cancer    Vitamin B12 deficiency    Vitamin D deficiency     Patient Active Problem List   Diagnosis Date Noted   Dehydration 03/22/2021   Pleural effusion 03/22/2021   Renal cell carcinoma (HCC) 02/09/2021   Renal cell adenoma of right kidney 01/05/2021    Past Surgical History:  Procedure Laterality Date   ABDOMINAL HYSTERECTOMY     APPENDECTOMY     KNEE SURGERY Right    TOTAL REPLACEMENT   RECONSTRUCTION OF EYELID Bilateral    LOWER LID REPAIR POST CANCER EXCISION   TUBAL LIGATION     WISDOM TOOTH EXTRACTION      OB History   No obstetric history on file.      Home Medications    Prior to Admission medications   Medication Sig Start Date End Date Taking? Authorizing Provider  albuterol (VENTOLIN HFA) 108 (90 Base) MCG/ACT inhaler  Inhale 2 puffs into the lungs every 4 (four) hours as needed. 08/06/23   [provider]  amitriptyline (ELAVIL) 25 MG tablet Take 25 mg by mouth daily. 08/10/20   [provider]  benzonatate (TESSALON) 100 MG capsule Take 1 capsule (100 mg total) by mouth 3 (three) times daily as needed for cough. 02/21/24   Zenia Resides, MD  Calcium Carbonate-Vitamin D (CALCIUM-VITAMIN D) 600-125 MG-UNIT TABS Take 1 tablet by mouth 2 (two) times daily. Patient not taking: Reported on 01/29/2024    [provider]  Cholecalciferol (VITAMIN D) 50 MCG (2000 UT) CAPS Take 2,000 Units by mouth daily.    [provider]  citalopram (CELEXA) 20 MG tablet Take 20 mg by mouth daily. 08/10/20   [provider]  clobetasol ointment (TEMOVATE) 0.05 % Apply topically. 12/26/21   [provider]  famotidine (PEPCID) 20 MG tablet Take 20 mg by mouth daily.    [provider]  Fluocinolone Acetonide 0.01 % OIL  02/12/22   [provider]  gabapentin (NEURONTIN) 100 MG capsule Take 200 mg by mouth at bedtime. 07/12/20   [provider]  naproxen sodium (ALEVE) 220 MG tablet Take 220 mg by mouth 2 (two)  times daily with a meal.    [provider]  nivolumab 480 mg in sodium chloride 0.9 % 100 mL Inject into the vein.    [provider]  rosuvastatin (CRESTOR) 10 MG tablet Take by mouth. 03/18/22   [provider]  triamcinolone cream (KENALOG) 0.1 % Apply 1 Application topically 2 (two) times daily. 02/11/23   Lewis, Dequincy A, MD  vitamin B-12 (CYANOCOBALAMIN) 1000 MCG tablet Take 1,000 mcg by mouth daily.    [provider]    Family History Family History  Problem Relation Age of Onset   Pancreatic cancer Mother    Breast cancer Mother    Melanoma Mother    Diabetes Father    Heart attack Father     Social History Social History   Tobacco Use   Smoking status: Former    Current packs/day: 0.00     Average packs/day: 1.5 packs/day for 33.0 years (49.5 ttl pk-yrs)    Types: Cigarettes    Start date: 09/13/1967    Quit date: 09/12/2000    Years since quitting: 23.4   Smokeless tobacco: Never  Vaping Use   Vaping status: Never Used  Substance Use Topics   Alcohol use: Yes    Comment: WINE DAILY   Drug use: Not Currently     Allergies   Codeine, Meperidine, Gentamicin, Naphazoline, Neo-bacit-poly-lidocaine, Other, Polymyxin b, Pramoxine, Sulfa antibiotics, Bacitracin-neomycin-polymyxin, Bacitracin-polymyxin b, Dextrose, Fluorometholone, Metronidazole, Morphine and codeine, Neomycin-polymyxin-gramicidin, Oxycodone-aspirin, Prednisone, Tobramycin, and Tramadol hcl   Review of Systems Review of Systems  Respiratory:  Positive for cough.      Physical Exam Triage Vital Signs ED Triage Vitals  Encounter Vitals Group     BP 02/21/24 1111 (!) 156/77     Systolic BP Percentile --      Diastolic BP Percentile --      Pulse Rate 02/21/24 1111 75     Resp 02/21/24 1111 20     Temp 02/21/24 1111 98 F (36.7 C)     Temp Source 02/21/24 1111 Oral     SpO2 02/21/24 1111 95 %     Weight --      Height --      Head Circumference --      Peak Flow --      Pain Score 02/21/24 1109 10     Pain Loc --      Pain Education --      Exclude from Growth Chart --    No data found.  Updated Vital Signs BP (!) 156/77 (BP Location: Right Arm)   Pulse 75   Temp 98 F (36.7 C) (Oral)   Resp 20   SpO2 95%   Visual Acuity Right Eye Distance:   Left Eye Distance:   Bilateral Distance:    Right Eye Near:   Left Eye Near:    Bilateral Near:     Physical Exam Vitals reviewed.  Constitutional:      General: She is not in acute distress.    Appearance: She is not toxic-appearing.  HENT:     Right Ear: Tympanic membrane and ear canal normal.     Left Ear: Tympanic membrane and ear canal normal.     Ears:     Comments: There is an erythematous ulcerated lesion on her right anterior  earlobe about 1.5 cm in diameter.  No drainage at this time     Nose: Nose normal.     Mouth/Throat:     Mouth:  Mucous membranes are moist.     Pharynx: No oropharyngeal exudate or posterior oropharyngeal erythema.  Eyes:     Extraocular Movements: Extraocular movements intact.     Conjunctiva/sclera: Conjunctivae normal.     Pupils: Pupils are equal, round, and reactive to light.  Cardiovascular:     Rate and Rhythm: Normal rate and regular rhythm.     Heart sounds: No murmur heard. Pulmonary:     Effort: No respiratory distress.     Breath sounds: No stridor. No wheezing or rhonchi.     Comments: There may be some rales in her left lung.  I feel that breath sounds are audible in the right lower lung, but there is dullness to percussion of the right lower lung field posteriorly. Musculoskeletal:     Cervical back: Neck supple.  Lymphadenopathy:     Cervical: No cervical adenopathy.  Skin:    Capillary Refill: Capillary refill takes less than 2 seconds.     Coloration: Skin is not jaundiced or pale.  Neurological:     General: No focal deficit present.     Mental Status: She is alert and oriented to person, place, and time.  Psychiatric:        Behavior: Behavior normal.      UC Treatments / Results  Labs (all labs ordered are listed, but only abnormal results are displayed) Labs Reviewed - No data to display  EKG   Radiology DG Chest 2 View Result Date: 02/21/2024 CLINICAL DATA:  Cough for 1 week. Shortness of breath and right-sided chest pain. EXAM: CHEST - 2 VIEW COMPARISON:  CT 01/22/2024.  Radiographs 08/14/2023 and 01/02/2023. FINDINGS: Right IJ Port-A-Cath tip extends to the superior cavoatrial junction. The heart size and mediastinal contours are stable. There are stable postsurgical changes at both lung apices. Small right pleural effusion appears improved from the CT of 1 month ago. There are small bilateral pleural effusions with mild bibasilar atelectasis. No  confluent airspace disease, suspicious nodularity or pneumothorax. Grossly stable chronic right lateral rib deformities without evidence of acute osseous abnormality. IMPRESSION: Right pleural effusion appears improved from most recent CT. Small pleural effusions with mild bibasilar atelectasis. No evidence of pneumonia or other definite acute process. Electronically Signed   By: Carey Bullocks M.D.   On: 02/21/2024 12:28    Procedures Procedures (including critical care time)  Medications Ordered in UC Medications - No data to display  Initial Impression / Assessment and Plan / UC Course  I have reviewed the triage vital signs and the nursing notes.  Pertinent labs & imaging results that were available during my care of the patient were reviewed by me and considered in my medical decision making (see chart for details).     Radiology report.  There is no infiltrate on the chest x-ray though there is a right lower lobe effusion for sure.  Radiology states that this seems smaller than when a CT was done recently.  Tessalon Perles are sent in to try to help her cough.  She will be in touch with her oncology specialist, to see if she needs to have a thoracentesis anytime soon since she is short of breath.  She is outside the window of when COVID testing would be helpful or when an antiviral would be helpful. Final Clinical Impressions(s) / UC Diagnoses   Final diagnoses:  Acute cough  Pleural effusion  Shortness of breath     Discharge Instructions      The chest x-ray  report does not note any sign of pneumonia.  You do have a pleural effusion on the right. Please contact your oncologist about the shortness of breath and the pleural effusion.  Take benzonatate 100 mg, 1 tab every 8 hours as needed for cough.        ED Prescriptions     Medication Sig Dispense Auth. Provider   benzonatate (TESSALON) 100 MG capsule  (Status: Discontinued) Take 1 capsule (100 mg total) by  mouth 3 (three) times daily as needed for cough. 21 capsule Zenia Resides, MD   benzonatate (TESSALON) 100 MG capsule Take 1 capsule (100 mg total) by mouth 3 (three) times daily as needed for cough. 21 capsule Zenia Resides, MD      PDMP not reviewed this encounter.   Zenia Resides, MD 02/21/24 [phone]

## 2024-02-21 NOTE — Discharge Instructions (Signed)
 The chest x-ray report does not note any sign of pneumonia.  You do have a pleural effusion on the right. Please contact your oncologist about the shortness of breath and the pleural effusion.  Take benzonatate 100 mg, 1 tab every 8 hours as needed for cough.

## 2024-02-23 ENCOUNTER — Other Ambulatory Visit: Payer: Self-pay

## 2024-02-23 ENCOUNTER — Ambulatory Visit
Admission: RE | Admit: 2024-02-23 | Discharge: 2024-02-23 | Disposition: A | Discharge status: Home / Self Care | Payer: Medicare PPO | Source: Ambulatory Visit | Attending: Radiation Oncology | Admitting: Radiation Oncology

## 2024-02-23 ENCOUNTER — Ambulatory Visit: Payer: Medicare PPO

## 2024-02-23 DIAGNOSIS — Z5112 Encounter for antineoplastic immunotherapy: Secondary | ICD-10-CM | POA: Diagnosis not present

## 2024-02-23 DIAGNOSIS — Z510 Encounter for antineoplastic radiation therapy: Secondary | ICD-10-CM | POA: Insufficient documentation

## 2024-02-23 DIAGNOSIS — J910 Malignant pleural effusion: Secondary | ICD-10-CM | POA: Insufficient documentation

## 2024-02-23 DIAGNOSIS — C44212 Basal cell carcinoma of skin of right ear and external auricular canal: Secondary | ICD-10-CM | POA: Insufficient documentation

## 2024-02-23 DIAGNOSIS — C641 Malignant neoplasm of right kidney, except renal pelvis: Secondary | ICD-10-CM | POA: Diagnosis not present

## 2024-02-23 DIAGNOSIS — Z79620 Long term (current) use of immunosuppressive biologic: Secondary | ICD-10-CM | POA: Insufficient documentation

## 2024-02-23 LAB — RAD ONC ARIA SESSION SUMMARY
Course Elapsed Days: 0
Plan Fractions Treated to Date: 1
Plan Prescribed Dose Per Fraction: 2.5 Gy
Plan Total Fractions Prescribed: 20
Plan Total Prescribed Dose: 50 Gy
Reference Point Dosage Given to Date: 2.5 Gy
Reference Point Session Dosage Given: 2.5 Gy
Session Number: 1

## 2024-02-24 ENCOUNTER — Other Ambulatory Visit: Payer: Self-pay

## 2024-02-24 ENCOUNTER — Ambulatory Visit
Admission: RE | Admit: 2024-02-24 | Discharge: 2024-02-24 | Disposition: A | Discharge status: Home / Self Care | Payer: Medicare PPO | Source: Ambulatory Visit | Attending: Radiation Oncology

## 2024-02-24 ENCOUNTER — Ambulatory Visit
Admission: RE | Admit: 2024-02-24 | Discharge: 2024-02-24 | Disposition: A | Discharge status: Home / Self Care | Payer: Medicare PPO | Source: Ambulatory Visit | Attending: Radiation Oncology | Admitting: Radiation Oncology

## 2024-02-24 DIAGNOSIS — C44212 Basal cell carcinoma of skin of right ear and external auricular canal: Secondary | ICD-10-CM | POA: Diagnosis not present

## 2024-02-24 DIAGNOSIS — J910 Malignant pleural effusion: Secondary | ICD-10-CM | POA: Diagnosis not present

## 2024-02-24 DIAGNOSIS — Z5112 Encounter for antineoplastic immunotherapy: Secondary | ICD-10-CM | POA: Diagnosis not present

## 2024-02-24 DIAGNOSIS — Z510 Encounter for antineoplastic radiation therapy: Secondary | ICD-10-CM | POA: Diagnosis not present

## 2024-02-24 DIAGNOSIS — Z79620 Long term (current) use of immunosuppressive biologic: Secondary | ICD-10-CM | POA: Diagnosis not present

## 2024-02-24 DIAGNOSIS — C641 Malignant neoplasm of right kidney, except renal pelvis: Secondary | ICD-10-CM | POA: Diagnosis not present

## 2024-02-24 LAB — RAD ONC ARIA SESSION SUMMARY
Course Elapsed Days: 1
Plan Fractions Treated to Date: 2
Plan Prescribed Dose Per Fraction: 2.5 Gy
Plan Total Fractions Prescribed: 20
Plan Total Prescribed Dose: 50 Gy
Reference Point Dosage Given to Date: 5 Gy
Reference Point Session Dosage Given: 2.5 Gy
Session Number: 2

## 2024-02-25 ENCOUNTER — Encounter: Payer: Self-pay | Admitting: Oncology

## 2024-02-25 ENCOUNTER — Other Ambulatory Visit: Payer: Self-pay

## 2024-02-25 ENCOUNTER — Ambulatory Visit
Admission: RE | Admit: 2024-02-25 | Discharge: 2024-02-25 | Disposition: A | Discharge status: Home / Self Care | Payer: Medicare PPO | Source: Ambulatory Visit | Attending: Radiation Oncology | Admitting: Radiation Oncology

## 2024-02-25 DIAGNOSIS — J910 Malignant pleural effusion: Secondary | ICD-10-CM | POA: Diagnosis not present

## 2024-02-25 DIAGNOSIS — Z5112 Encounter for antineoplastic immunotherapy: Secondary | ICD-10-CM | POA: Diagnosis not present

## 2024-02-25 DIAGNOSIS — Z510 Encounter for antineoplastic radiation therapy: Secondary | ICD-10-CM | POA: Diagnosis not present

## 2024-02-25 DIAGNOSIS — Z79620 Long term (current) use of immunosuppressive biologic: Secondary | ICD-10-CM | POA: Diagnosis not present

## 2024-02-25 DIAGNOSIS — C641 Malignant neoplasm of right kidney, except renal pelvis: Secondary | ICD-10-CM | POA: Diagnosis not present

## 2024-02-25 DIAGNOSIS — C44212 Basal cell carcinoma of skin of right ear and external auricular canal: Secondary | ICD-10-CM | POA: Diagnosis not present

## 2024-02-25 LAB — RAD ONC ARIA SESSION SUMMARY
Course Elapsed Days: 2
Plan Fractions Treated to Date: 3
Plan Prescribed Dose Per Fraction: 2.5 Gy
Plan Total Fractions Prescribed: 20
Plan Total Prescribed Dose: 50 Gy
Reference Point Dosage Given to Date: 7.5 Gy
Reference Point Session Dosage Given: 2.5 Gy
Session Number: 3

## 2024-02-25 NOTE — Progress Notes (Unsigned)
 [facility]  [address] [place],  [place]  [address] [phone]  Clinic Day:  02/26/2024  Referring physician: Lise Auer, MD  HISTORY OF PRESENT ILLNESS:  The patient is a 73 y.o. female with metastatic renal cell carcinoma, proven per cytology from a right-sided thoracentesis in early December 2021.  She comes in today to be evaluated before heading into her 37th cycle of maintenance nivolumab. The patient tolerated her 36th cycle well without having any significant difficulties.  However, over the past week, the patient has had increased sinus congestion, intermittent coughing and shortness of breath.  She does not believe her immunotherapy is behind her active symptoms, particularly where pneumonitis could pose a problem.  Of note, recent CT scan showed no obvious evidence of disease progression.  This patient's maintenance nivolumab was preceded by 3 cycles of nivolumab/ipilimumab.  A 4th cycle was not given due to her being hospitalized for severe colitis related to the ipilimumab component of her combination immunotherapy.     PHYSICAL EXAM:  Blood pressure (!) 172/64, pulse 66, temperature 98.8 F (37.1 C), temperature source Oral, resp. rate 14, height 5\' 1"  (1.549 m), weight 141 lb 8 oz (64.2 kg), SpO2 98%. Wt Readings from Last 3 Encounters:  02/26/24 141 lb 8 oz (64.2 kg)  01/29/24 145 lb 3.2 oz (65.9 kg)  01/01/24 143 lb 8 oz (65.1 kg)   Body mass index is 26.74 kg/m. Performance status (ECOG): 1 - Symptomatic but completely ambulatory Physical Exam Constitutional:      Appearance: Normal appearance. She is not ill-appearing.  HENT:     Mouth/Throat:     Mouth: Mucous membranes are moist.     Pharynx: Oropharynx is clear. No oropharyngeal exudate or posterior oropharyngeal erythema.  Cardiovascular:     Rate and Rhythm: Normal rate and regular rhythm.     Heart sounds: No murmur heard.    No friction rub. No gallop.  Pulmonary:      Effort: Pulmonary effort is normal. No respiratory distress.     Breath sounds: Examination of the right-lower field reveals decreased breath sounds. Decreased breath sounds present. No wheezing, rhonchi or rales.  Abdominal:     General: Bowel sounds are normal. There is no distension.     Palpations: Abdomen is soft. There is no mass.     Tenderness: There is no abdominal tenderness.  Musculoskeletal:        General: No swelling.     Right lower leg: No edema.     Left lower leg: No edema.  Lymphadenopathy:     Cervical: No cervical adenopathy.     Upper Body:     Right upper body: No supraclavicular or axillary adenopathy.     Left upper body: No supraclavicular or axillary adenopathy.     Lower Body: No right inguinal adenopathy. No left inguinal adenopathy.  Skin:    General: Skin is warm.     Coloration: Skin is not jaundiced.     Findings: No lesion or rash.  Neurological:     General: No focal deficit present.     Mental Status: She is alert and oriented to person, place, and time. Mental status is at baseline.  Psychiatric:        Mood and Affect: Mood normal.        Behavior: Behavior normal.        Thought Content: Thought content normal.   LABS:      Latest  Ref Rng & Units 02/26/2024   10:31 AM 01/22/2024   10:14 AM 01/01/2024    9:38 AM  CBC  WBC 4.0 - 10.5 K/uL 6.3  5.7  6.6   Hemoglobin 12.0 - 15.0 g/dL 16.1  09.6  04.5   Hematocrit 36.0 - 46.0 % 38.0  38.8  40.2   Platelets 150 - 400 K/uL 224  180  223       Latest Ref Rng & Units 02/26/2024   10:31 AM 01/22/2024   10:14 AM 01/01/2024    9:38 AM  CMP  Glucose 70 - 99 mg/dL 409  811  914   BUN 8 - 23 mg/dL 12  11  12    Creatinine 0.44 - 1.00 mg/dL 7.82  9.56  2.13   Sodium 135 - 145 mmol/L 141  140  140   Potassium 3.5 - 5.1 mmol/L 4.0  3.7  4.1   Chloride 98 - 111 mmol/L 105  104  103   CO2 22 - 32 mmol/L 24  23  23    Calcium 8.9 - 10.3 mg/dL 9.0  9.2  9.5   Total Protein 6.5 - 8.1 g/dL 6.7  6.8  7.3    Total Bilirubin 0.0 - 1.2 mg/dL 0.2  0.3  0.4   Alkaline Phos 38 - 126 U/L 76  73  91   AST 15 - 41 U/L 26  29  32   ALT 0 - 44 U/L 22  23  23     ASSESSMENT & PLAN:  Assessment/Plan:  A 73 y.o. female with metastatic renal cell carcinoma.  She will proceed with her 37th cycle of maintenance nivolumab today.  With respect to her upper respiratory and sinus symptoms, I will prescribe her a Z-Pak.  Clinically, the patient is doing okay.  I sense no signs of disease progression.  I will see her back in 4 weeks before she heads into her 38th cycle of maintenance nivolumab.  The patient understands all the plans discussed today and is in agreement with them.    Leeandre Nordling Kirby Funk, MD

## 2024-02-26 ENCOUNTER — Inpatient Hospital Stay: Payer: Medicare PPO

## 2024-02-26 ENCOUNTER — Ambulatory Visit
Admission: RE | Admit: 2024-02-26 | Discharge: 2024-02-26 | Disposition: A | Discharge status: Home / Self Care | Payer: Medicare PPO | Source: Ambulatory Visit | Attending: Radiation Oncology | Admitting: Radiation Oncology

## 2024-02-26 ENCOUNTER — Other Ambulatory Visit: Payer: Self-pay | Admitting: Oncology

## 2024-02-26 ENCOUNTER — Inpatient Hospital Stay: Payer: Medicare PPO | Attending: Oncology | Admitting: Oncology

## 2024-02-26 ENCOUNTER — Other Ambulatory Visit: Payer: Self-pay

## 2024-02-26 VITALS — BP 172/64 | HR 66 | Temp 98.8°F | Resp 14 | Ht 61.0 in | Wt 141.5 lb

## 2024-02-26 DIAGNOSIS — Z79620 Long term (current) use of immunosuppressive biologic: Secondary | ICD-10-CM | POA: Diagnosis not present

## 2024-02-26 DIAGNOSIS — J910 Malignant pleural effusion: Secondary | ICD-10-CM | POA: Insufficient documentation

## 2024-02-26 DIAGNOSIS — Z510 Encounter for antineoplastic radiation therapy: Secondary | ICD-10-CM | POA: Diagnosis not present

## 2024-02-26 DIAGNOSIS — Z5112 Encounter for antineoplastic immunotherapy: Secondary | ICD-10-CM | POA: Insufficient documentation

## 2024-02-26 DIAGNOSIS — C44212 Basal cell carcinoma of skin of right ear and external auricular canal: Secondary | ICD-10-CM | POA: Diagnosis not present

## 2024-02-26 DIAGNOSIS — C641 Malignant neoplasm of right kidney, except renal pelvis: Secondary | ICD-10-CM | POA: Insufficient documentation

## 2024-02-26 DIAGNOSIS — D3001 Benign neoplasm of right kidney: Secondary | ICD-10-CM

## 2024-02-26 LAB — RAD ONC ARIA SESSION SUMMARY
Course Elapsed Days: 3
Plan Fractions Treated to Date: 4
Plan Prescribed Dose Per Fraction: 2.5 Gy
Plan Total Fractions Prescribed: 20
Plan Total Prescribed Dose: 50 Gy
Reference Point Dosage Given to Date: 10 Gy
Reference Point Session Dosage Given: 2.5 Gy
Session Number: 4

## 2024-02-26 LAB — CMP (CANCER CENTER ONLY)
ALT: 22 U/L (ref 0–44)
AST: 26 U/L (ref 15–41)
Albumin: 4.4 g/dL (ref 3.5–5.0)
Alkaline Phosphatase: 76 U/L (ref 38–126)
Anion gap: 12 (ref 5–15)
BUN: 12 mg/dL (ref 8–23)
CO2: 24 mmol/L (ref 22–32)
Calcium: 9 mg/dL (ref 8.9–10.3)
Chloride: 105 mmol/L (ref 98–111)
Creatinine: 0.71 mg/dL (ref 0.44–1.00)
GFR, Estimated: >60 mL/min (ref >60)
Glucose, Bld: 118 mg/dL — ABNORMAL HIGH (ref 70–99)
Potassium: 4 mmol/L (ref 3.5–5.1)
Sodium: 141 mmol/L (ref 135–145)
Total Bilirubin: 0.2 mg/dL (ref 0.0–1.2)
Total Protein: 6.7 g/dL (ref 6.5–8.1)

## 2024-02-26 LAB — CBC WITH DIFFERENTIAL (CANCER CENTER ONLY)
Abs Immature Granulocytes: 0.02 10*3/uL (ref 0.00–0.07)
Basophils Absolute: 0 10*3/uL (ref 0.0–0.1)
Basophils Relative: 1 %
Eosinophils Absolute: 0.3 10*3/uL (ref 0.0–0.5)
Eosinophils Relative: 5 %
HCT: 38 % (ref 36.0–46.0)
Hemoglobin: 13.1 g/dL (ref 12.0–15.0)
Immature Granulocytes: 0 %
Lymphocytes Relative: 25 %
Lymphs Abs: 1.6 10*3/uL (ref 0.7–4.0)
MCH: 32.6 pg (ref 26.0–34.0)
MCHC: 34.5 g/dL (ref 30.0–36.0)
MCV: 94.5 fL (ref 80.0–100.0)
Monocytes Absolute: 0.6 10*3/uL (ref 0.1–1.0)
Monocytes Relative: 10 %
Neutro Abs: 3.8 10*3/uL (ref 1.7–7.7)
Neutrophils Relative %: 59 %
Platelet Count: 224 10*3/uL (ref 150–400)
RBC: 4.02 MIL/uL (ref 3.87–5.11)
RDW: 13.8 % (ref 11.5–15.5)
WBC Count: 6.3 10*3/uL (ref 4.0–10.5)
nRBC: 0 % (ref 0.0–0.2)
nRBC: 0 /100{WBCs}

## 2024-02-26 LAB — TSH: TSH: 0.691 u[IU]/mL (ref 0.350–4.500)

## 2024-02-26 MED ORDER — SODIUM CHLORIDE 0.9% FLUSH
10.0000 mL | INTRAVENOUS | Status: DC | PRN
  Administered 2024-02-26: 10 mL

## 2024-02-26 MED ORDER — HEPARIN SOD (PORK) LOCK FLUSH 100 UNIT/ML IV SOLN
500.0000 [IU] | Freq: Once | INTRAVENOUS | Status: AC | PRN
  Administered 2024-02-26: 500 [IU]

## 2024-02-26 MED ORDER — SODIUM CHLORIDE 0.9 % IV SOLN
480.0000 mg | Freq: Once | INTRAVENOUS | Status: AC
  Administered 2024-02-26: 480 mg via INTRAVENOUS
  Filled 2024-02-26: qty 48

## 2024-02-26 MED ORDER — AZITHROMYCIN 250 MG PO TABS: ORAL_TABLET | ORAL | 0 refills | Status: DC

## 2024-02-26 MED ORDER — SODIUM CHLORIDE 0.9 % IV SOLN
Freq: Once | INTRAVENOUS | Status: AC
Start: 2024-02-26 — End: 2024-02-26

## 2024-02-26 NOTE — Patient Instructions (Signed)
 Nivolumab Injection What is this medication? NIVOLUMAB (nye VOL ue mab) treats some types of cancer. It works by helping your immune system slow or stop the spread of cancer cells. It is a monoclonal antibody. This medicine may be used for other purposes; ask your health care provider or pharmacist if you have questions. COMMON BRAND NAME(S): Opdivo What should I tell my care team before I take this medication? They need to know if you have any of these conditions: Allogeneic stem cell transplant (uses someone else's stem cells) Autoimmune diseases, such as Crohn disease, ulcerative colitis, lupus History of chest radiation Nervous system problems, such as Guillain-Barre syndrome or myasthenia gravis Organ transplant An unusual or allergic reaction to nivolumab, other medications, foods, dyes, or preservatives Pregnant or trying to get pregnant Breast-feeding How should I use this medication? This medication is infused into a vein. It is given in a hospital or clinic setting. A special MedGuide will be given to you before each treatment. Be sure to read this information carefully each time. Talk to your care team about the use of this medication in children. While it may be prescribed for children as young as 12 years for selected conditions, precautions do apply. Overdosage: If you think you have taken too much of this medicine contact a poison control center or emergency room at once. NOTE: This medicine is only for you. Do not share this medicine with others. What if I miss a dose? Keep appointments for follow-up doses. It is important not to miss your dose. Call your care team if you are unable to keep an appointment. What may interact with this medication? Interactions have not been studied. This list may not describe all possible interactions. Give your health care provider a list of all the medicines, herbs, non-prescription drugs, or dietary supplements you use. Also tell them if you  smoke, drink alcohol, or use illegal drugs. Some items may interact with your medicine. What should I watch for while using this medication? Your condition will be monitored carefully while you are receiving this medication. You may need blood work while taking this medication. This medication may cause serious skin reactions. They can happen weeks to months after starting the medication. Contact your care team right away if you notice fevers or flu-like symptoms with a rash. The rash may be red or purple and then turn into blisters or peeling of the skin. You may also notice a red rash with swelling of the face, lips, or lymph nodes in your neck or under your arms. Tell your care team right away if you have any change in your eyesight. Talk to your care team if you are pregnant or think you might be pregnant. A negative pregnancy test is required before starting this medication. A reliable form of contraception is recommended while taking this medication and for 5 months after the last dose. Talk to your care team about effective forms of contraception. Do not breast-feed while taking this medication and for 5 months after the last dose. What side effects may I notice from receiving this medication? Side effects that you should report to your care team as soon as possible: Allergic reactions--skin rash, itching, hives, swelling of the face, lips, tongue, or throat Dry cough, shortness of breath or trouble breathing Eye pain, redness, irritation, or discharge with blurry or decreased vision Heart muscle inflammation--unusual weakness or fatigue, shortness of breath, chest pain, fast or irregular heartbeat, dizziness, swelling of the ankles, feet, or hands Hormone  gland problems--headache, sensitivity to light, unusual weakness or fatigue, dizziness, fast or irregular heartbeat, increased sensitivity to cold or heat, excessive sweating, constipation, hair loss, increased thirst or amount of urine,  tremors or shaking, irritability Infusion reactions--chest pain, shortness of breath or trouble breathing, feeling faint or lightheaded Kidney injury (glomerulonephritis)--decrease in the amount of urine, red or dark brown urine, foamy or bubbly urine, swelling of the ankles, hands, or feet Liver injury--right upper belly pain, loss of appetite, nausea, light-colored stool, dark yellow or brown urine, yellowing skin or eyes, unusual weakness or fatigue Pain, tingling, or numbness in the hands or feet, muscle weakness, change in vision, confusion or trouble speaking, loss of balance or coordination, trouble walking, seizures Rash, fever, and swollen lymph nodes Redness, blistering, peeling, or loosening of the skin, including inside the mouth Sudden or severe stomach pain, bloody diarrhea, fever, nausea, vomiting Side effects that usually do not require medical attention (report these to your care team if they continue or are bothersome): Bone, joint, or muscle pain Diarrhea Fatigue Loss of appetite Nausea Skin rash This list may not describe all possible side effects. Call your doctor for medical advice about side effects. You may report side effects to FDA at 1-800-FDA-1088. Where should I keep my medication? This medication is given in a hospital or clinic. It will not be stored at home. NOTE: This sheet is a summary. It may not cover all possible information. If you have questions about this medicine, talk to your doctor, pharmacist, or health care provider.  2024 Elsevier/Gold Standard (2022-04-08 00:00:00)

## 2024-02-27 ENCOUNTER — Other Ambulatory Visit: Payer: Self-pay

## 2024-02-27 ENCOUNTER — Ambulatory Visit
Admission: RE | Admit: 2024-02-27 | Discharge: 2024-02-27 | Disposition: A | Discharge status: Home / Self Care | Payer: Medicare PPO | Source: Ambulatory Visit | Attending: Radiation Oncology | Admitting: Radiation Oncology

## 2024-02-27 DIAGNOSIS — C641 Malignant neoplasm of right kidney, except renal pelvis: Secondary | ICD-10-CM | POA: Diagnosis not present

## 2024-02-27 DIAGNOSIS — Z510 Encounter for antineoplastic radiation therapy: Secondary | ICD-10-CM | POA: Diagnosis not present

## 2024-02-27 DIAGNOSIS — J910 Malignant pleural effusion: Secondary | ICD-10-CM | POA: Diagnosis not present

## 2024-02-27 DIAGNOSIS — Z79620 Long term (current) use of immunosuppressive biologic: Secondary | ICD-10-CM | POA: Diagnosis not present

## 2024-02-27 DIAGNOSIS — Z5112 Encounter for antineoplastic immunotherapy: Secondary | ICD-10-CM | POA: Diagnosis not present

## 2024-02-27 DIAGNOSIS — C44212 Basal cell carcinoma of skin of right ear and external auricular canal: Secondary | ICD-10-CM | POA: Diagnosis not present

## 2024-02-27 LAB — RAD ONC ARIA SESSION SUMMARY
Course Elapsed Days: 4
Plan Fractions Treated to Date: 5
Plan Prescribed Dose Per Fraction: 2.5 Gy
Plan Total Fractions Prescribed: 20
Plan Total Prescribed Dose: 50 Gy
Reference Point Dosage Given to Date: 12.5 Gy
Reference Point Session Dosage Given: 2.5 Gy
Session Number: 5

## 2024-02-27 LAB — T4: T4, Total: 6.6 ug/dL (ref 4.5–12.0)

## 2024-03-01 ENCOUNTER — Other Ambulatory Visit: Payer: Self-pay

## 2024-03-01 ENCOUNTER — Ambulatory Visit
Admission: RE | Admit: 2024-03-01 | Discharge: 2024-03-01 | Disposition: A | Discharge status: Home / Self Care | Payer: Medicare PPO | Source: Ambulatory Visit | Attending: Radiation Oncology | Admitting: Radiation Oncology

## 2024-03-01 DIAGNOSIS — J910 Malignant pleural effusion: Secondary | ICD-10-CM | POA: Diagnosis not present

## 2024-03-01 DIAGNOSIS — C44212 Basal cell carcinoma of skin of right ear and external auricular canal: Secondary | ICD-10-CM | POA: Diagnosis not present

## 2024-03-01 DIAGNOSIS — C641 Malignant neoplasm of right kidney, except renal pelvis: Secondary | ICD-10-CM | POA: Diagnosis not present

## 2024-03-01 DIAGNOSIS — Z79620 Long term (current) use of immunosuppressive biologic: Secondary | ICD-10-CM | POA: Diagnosis not present

## 2024-03-01 DIAGNOSIS — Z5112 Encounter for antineoplastic immunotherapy: Secondary | ICD-10-CM | POA: Diagnosis not present

## 2024-03-01 DIAGNOSIS — Z510 Encounter for antineoplastic radiation therapy: Secondary | ICD-10-CM | POA: Diagnosis not present

## 2024-03-01 LAB — RAD ONC ARIA SESSION SUMMARY
Course Elapsed Days: 7
Plan Fractions Treated to Date: 6
Plan Prescribed Dose Per Fraction: 2.5 Gy
Plan Total Fractions Prescribed: 20
Plan Total Prescribed Dose: 50 Gy
Reference Point Dosage Given to Date: 15 Gy
Reference Point Session Dosage Given: 2.5 Gy
Session Number: 6

## 2024-03-02 ENCOUNTER — Other Ambulatory Visit: Payer: Self-pay

## 2024-03-02 ENCOUNTER — Ambulatory Visit
Admission: RE | Admit: 2024-03-02 | Discharge: 2024-03-02 | Disposition: A | Discharge status: Home / Self Care | Source: Ambulatory Visit | Attending: Radiation Oncology | Admitting: Radiation Oncology

## 2024-03-02 ENCOUNTER — Ambulatory Visit
Admission: RE | Admit: 2024-03-02 | Discharge: 2024-03-02 | Disposition: A | Discharge status: Home / Self Care | Payer: Medicare PPO | Source: Ambulatory Visit | Attending: Radiation Oncology

## 2024-03-02 DIAGNOSIS — Z79620 Long term (current) use of immunosuppressive biologic: Secondary | ICD-10-CM | POA: Diagnosis not present

## 2024-03-02 DIAGNOSIS — J910 Malignant pleural effusion: Secondary | ICD-10-CM | POA: Diagnosis not present

## 2024-03-02 DIAGNOSIS — Z5112 Encounter for antineoplastic immunotherapy: Secondary | ICD-10-CM | POA: Diagnosis not present

## 2024-03-02 DIAGNOSIS — Z510 Encounter for antineoplastic radiation therapy: Secondary | ICD-10-CM | POA: Diagnosis not present

## 2024-03-02 DIAGNOSIS — C641 Malignant neoplasm of right kidney, except renal pelvis: Secondary | ICD-10-CM | POA: Diagnosis not present

## 2024-03-02 DIAGNOSIS — C44212 Basal cell carcinoma of skin of right ear and external auricular canal: Secondary | ICD-10-CM | POA: Diagnosis not present

## 2024-03-02 LAB — RAD ONC ARIA SESSION SUMMARY
Course Elapsed Days: 8
Plan Fractions Treated to Date: 7
Plan Prescribed Dose Per Fraction: 2.5 Gy
Plan Total Fractions Prescribed: 20
Plan Total Prescribed Dose: 50 Gy
Reference Point Dosage Given to Date: 17.5 Gy
Reference Point Session Dosage Given: 2.5 Gy
Session Number: 7

## 2024-03-03 ENCOUNTER — Ambulatory Visit
Admission: RE | Admit: 2024-03-03 | Discharge: 2024-03-03 | Disposition: A | Discharge status: Home / Self Care | Payer: Medicare PPO | Source: Ambulatory Visit | Attending: Radiation Oncology | Admitting: Radiation Oncology

## 2024-03-03 ENCOUNTER — Other Ambulatory Visit: Payer: Self-pay

## 2024-03-03 DIAGNOSIS — C641 Malignant neoplasm of right kidney, except renal pelvis: Secondary | ICD-10-CM | POA: Diagnosis not present

## 2024-03-03 DIAGNOSIS — Z5112 Encounter for antineoplastic immunotherapy: Secondary | ICD-10-CM | POA: Diagnosis not present

## 2024-03-03 DIAGNOSIS — Z510 Encounter for antineoplastic radiation therapy: Secondary | ICD-10-CM | POA: Diagnosis not present

## 2024-03-03 DIAGNOSIS — C44212 Basal cell carcinoma of skin of right ear and external auricular canal: Secondary | ICD-10-CM | POA: Diagnosis not present

## 2024-03-03 DIAGNOSIS — J910 Malignant pleural effusion: Secondary | ICD-10-CM | POA: Diagnosis not present

## 2024-03-03 DIAGNOSIS — Z79620 Long term (current) use of immunosuppressive biologic: Secondary | ICD-10-CM | POA: Diagnosis not present

## 2024-03-03 LAB — RAD ONC ARIA SESSION SUMMARY
Course Elapsed Days: 9
Plan Fractions Treated to Date: 8
Plan Prescribed Dose Per Fraction: 2.5 Gy
Plan Total Fractions Prescribed: 20
Plan Total Prescribed Dose: 50 Gy
Reference Point Dosage Given to Date: 20 Gy
Reference Point Session Dosage Given: 2.5 Gy
Session Number: 8

## 2024-03-04 ENCOUNTER — Other Ambulatory Visit: Payer: Self-pay

## 2024-03-04 ENCOUNTER — Ambulatory Visit
Admission: RE | Admit: 2024-03-04 | Discharge: 2024-03-04 | Disposition: A | Discharge status: Home / Self Care | Payer: Medicare PPO | Source: Ambulatory Visit | Attending: Radiation Oncology | Admitting: Radiation Oncology

## 2024-03-04 DIAGNOSIS — C641 Malignant neoplasm of right kidney, except renal pelvis: Secondary | ICD-10-CM | POA: Diagnosis not present

## 2024-03-04 DIAGNOSIS — Z79620 Long term (current) use of immunosuppressive biologic: Secondary | ICD-10-CM | POA: Diagnosis not present

## 2024-03-04 DIAGNOSIS — C44212 Basal cell carcinoma of skin of right ear and external auricular canal: Secondary | ICD-10-CM | POA: Diagnosis not present

## 2024-03-04 DIAGNOSIS — Z510 Encounter for antineoplastic radiation therapy: Secondary | ICD-10-CM | POA: Diagnosis not present

## 2024-03-04 DIAGNOSIS — J910 Malignant pleural effusion: Secondary | ICD-10-CM | POA: Diagnosis not present

## 2024-03-04 DIAGNOSIS — Z5112 Encounter for antineoplastic immunotherapy: Secondary | ICD-10-CM | POA: Diagnosis not present

## 2024-03-04 LAB — RAD ONC ARIA SESSION SUMMARY
Course Elapsed Days: 10
Plan Fractions Treated to Date: 9
Plan Prescribed Dose Per Fraction: 2.5 Gy
Plan Total Fractions Prescribed: 20
Plan Total Prescribed Dose: 50 Gy
Reference Point Dosage Given to Date: 22.5 Gy
Reference Point Session Dosage Given: 2.5 Gy
Session Number: 9

## 2024-03-05 ENCOUNTER — Ambulatory Visit
Admission: RE | Admit: 2024-03-05 | Discharge: 2024-03-05 | Disposition: A | Discharge status: Home / Self Care | Payer: Medicare PPO | Source: Ambulatory Visit | Attending: Radiation Oncology | Admitting: Radiation Oncology

## 2024-03-05 ENCOUNTER — Other Ambulatory Visit: Payer: Self-pay

## 2024-03-05 DIAGNOSIS — Z510 Encounter for antineoplastic radiation therapy: Secondary | ICD-10-CM | POA: Diagnosis not present

## 2024-03-05 DIAGNOSIS — C44212 Basal cell carcinoma of skin of right ear and external auricular canal: Secondary | ICD-10-CM | POA: Diagnosis not present

## 2024-03-05 DIAGNOSIS — Z79620 Long term (current) use of immunosuppressive biologic: Secondary | ICD-10-CM | POA: Diagnosis not present

## 2024-03-05 DIAGNOSIS — C641 Malignant neoplasm of right kidney, except renal pelvis: Secondary | ICD-10-CM | POA: Diagnosis not present

## 2024-03-05 DIAGNOSIS — Z5112 Encounter for antineoplastic immunotherapy: Secondary | ICD-10-CM | POA: Diagnosis not present

## 2024-03-05 DIAGNOSIS — J910 Malignant pleural effusion: Secondary | ICD-10-CM | POA: Diagnosis not present

## 2024-03-05 LAB — RAD ONC ARIA SESSION SUMMARY
Course Elapsed Days: 11
Plan Fractions Treated to Date: 10
Plan Prescribed Dose Per Fraction: 2.5 Gy
Plan Total Fractions Prescribed: 20
Plan Total Prescribed Dose: 50 Gy
Reference Point Dosage Given to Date: 25 Gy
Reference Point Session Dosage Given: 2.5 Gy
Session Number: 10

## 2024-03-08 ENCOUNTER — Ambulatory Visit
Admission: RE | Admit: 2024-03-08 | Discharge: 2024-03-08 | Disposition: A | Discharge status: Home / Self Care | Payer: Medicare PPO | Source: Ambulatory Visit | Attending: Radiation Oncology | Admitting: Radiation Oncology

## 2024-03-08 ENCOUNTER — Other Ambulatory Visit: Payer: Self-pay

## 2024-03-08 DIAGNOSIS — J910 Malignant pleural effusion: Secondary | ICD-10-CM | POA: Diagnosis not present

## 2024-03-08 DIAGNOSIS — C641 Malignant neoplasm of right kidney, except renal pelvis: Secondary | ICD-10-CM | POA: Diagnosis not present

## 2024-03-08 DIAGNOSIS — Z510 Encounter for antineoplastic radiation therapy: Secondary | ICD-10-CM | POA: Diagnosis not present

## 2024-03-08 DIAGNOSIS — Z5112 Encounter for antineoplastic immunotherapy: Secondary | ICD-10-CM | POA: Diagnosis not present

## 2024-03-08 DIAGNOSIS — C44212 Basal cell carcinoma of skin of right ear and external auricular canal: Secondary | ICD-10-CM | POA: Diagnosis not present

## 2024-03-08 DIAGNOSIS — Z79620 Long term (current) use of immunosuppressive biologic: Secondary | ICD-10-CM | POA: Diagnosis not present

## 2024-03-08 LAB — RAD ONC ARIA SESSION SUMMARY
Course Elapsed Days: 14
Plan Fractions Treated to Date: 11
Plan Prescribed Dose Per Fraction: 2.5 Gy
Plan Total Fractions Prescribed: 20
Plan Total Prescribed Dose: 50 Gy
Reference Point Dosage Given to Date: 27.5 Gy
Reference Point Session Dosage Given: 2.5 Gy
Session Number: 11

## 2024-03-09 ENCOUNTER — Ambulatory Visit
Admission: RE | Admit: 2024-03-09 | Discharge: 2024-03-09 | Disposition: A | Discharge status: Home / Self Care | Payer: Medicare PPO | Source: Ambulatory Visit | Attending: Radiation Oncology | Admitting: Radiation Oncology

## 2024-03-09 ENCOUNTER — Other Ambulatory Visit: Payer: Self-pay

## 2024-03-09 ENCOUNTER — Ambulatory Visit
Admission: RE | Admit: 2024-03-09 | Discharge: 2024-03-09 | Disposition: A | Discharge status: Home / Self Care | Source: Ambulatory Visit | Attending: Radiation Oncology | Admitting: Radiation Oncology

## 2024-03-09 DIAGNOSIS — C641 Malignant neoplasm of right kidney, except renal pelvis: Secondary | ICD-10-CM | POA: Diagnosis not present

## 2024-03-09 DIAGNOSIS — Z510 Encounter for antineoplastic radiation therapy: Secondary | ICD-10-CM | POA: Diagnosis not present

## 2024-03-09 DIAGNOSIS — Z79620 Long term (current) use of immunosuppressive biologic: Secondary | ICD-10-CM | POA: Diagnosis not present

## 2024-03-09 DIAGNOSIS — Z5112 Encounter for antineoplastic immunotherapy: Secondary | ICD-10-CM | POA: Diagnosis not present

## 2024-03-09 DIAGNOSIS — C44212 Basal cell carcinoma of skin of right ear and external auricular canal: Secondary | ICD-10-CM | POA: Diagnosis not present

## 2024-03-09 DIAGNOSIS — J910 Malignant pleural effusion: Secondary | ICD-10-CM | POA: Diagnosis not present

## 2024-03-09 LAB — RAD ONC ARIA SESSION SUMMARY
Course Elapsed Days: 15
Plan Fractions Treated to Date: 12
Plan Prescribed Dose Per Fraction: 2.5 Gy
Plan Total Fractions Prescribed: 20
Plan Total Prescribed Dose: 50 Gy
Reference Point Dosage Given to Date: 30 Gy
Reference Point Session Dosage Given: 2.5 Gy
Session Number: 12

## 2024-03-10 ENCOUNTER — Other Ambulatory Visit: Payer: Self-pay

## 2024-03-10 ENCOUNTER — Ambulatory Visit
Admission: RE | Admit: 2024-03-10 | Discharge: 2024-03-10 | Disposition: A | Discharge status: Home / Self Care | Payer: Medicare PPO | Source: Ambulatory Visit | Attending: Radiation Oncology

## 2024-03-10 DIAGNOSIS — J910 Malignant pleural effusion: Secondary | ICD-10-CM | POA: Diagnosis not present

## 2024-03-10 DIAGNOSIS — C44212 Basal cell carcinoma of skin of right ear and external auricular canal: Secondary | ICD-10-CM | POA: Diagnosis not present

## 2024-03-10 DIAGNOSIS — C641 Malignant neoplasm of right kidney, except renal pelvis: Secondary | ICD-10-CM | POA: Diagnosis not present

## 2024-03-10 DIAGNOSIS — Z510 Encounter for antineoplastic radiation therapy: Secondary | ICD-10-CM | POA: Diagnosis not present

## 2024-03-10 DIAGNOSIS — Z5112 Encounter for antineoplastic immunotherapy: Secondary | ICD-10-CM | POA: Diagnosis not present

## 2024-03-10 DIAGNOSIS — Z79620 Long term (current) use of immunosuppressive biologic: Secondary | ICD-10-CM | POA: Diagnosis not present

## 2024-03-10 LAB — RAD ONC ARIA SESSION SUMMARY
Course Elapsed Days: 16
Plan Fractions Treated to Date: 13
Plan Prescribed Dose Per Fraction: 2.5 Gy
Plan Total Fractions Prescribed: 20
Plan Total Prescribed Dose: 50 Gy
Reference Point Dosage Given to Date: 32.5 Gy
Reference Point Session Dosage Given: 2.5 Gy
Session Number: 13

## 2024-03-11 ENCOUNTER — Ambulatory Visit
Admission: RE | Admit: 2024-03-11 | Discharge: 2024-03-11 | Disposition: A | Discharge status: Home / Self Care | Payer: Medicare PPO | Source: Ambulatory Visit | Attending: Radiation Oncology | Admitting: Radiation Oncology

## 2024-03-11 ENCOUNTER — Other Ambulatory Visit: Payer: Self-pay

## 2024-03-11 ENCOUNTER — Ambulatory Visit
Admission: RE | Admit: 2024-03-11 | Discharge: 2024-03-11 | Disposition: A | Discharge status: Home / Self Care | Source: Ambulatory Visit | Attending: Radiation Oncology | Admitting: Radiation Oncology

## 2024-03-11 DIAGNOSIS — C44212 Basal cell carcinoma of skin of right ear and external auricular canal: Secondary | ICD-10-CM | POA: Diagnosis not present

## 2024-03-11 DIAGNOSIS — Z79620 Long term (current) use of immunosuppressive biologic: Secondary | ICD-10-CM | POA: Diagnosis not present

## 2024-03-11 DIAGNOSIS — Z5112 Encounter for antineoplastic immunotherapy: Secondary | ICD-10-CM | POA: Diagnosis not present

## 2024-03-11 DIAGNOSIS — Z510 Encounter for antineoplastic radiation therapy: Secondary | ICD-10-CM | POA: Diagnosis not present

## 2024-03-11 DIAGNOSIS — C641 Malignant neoplasm of right kidney, except renal pelvis: Secondary | ICD-10-CM | POA: Diagnosis not present

## 2024-03-11 DIAGNOSIS — J910 Malignant pleural effusion: Secondary | ICD-10-CM | POA: Diagnosis not present

## 2024-03-11 LAB — RAD ONC ARIA SESSION SUMMARY
Course Elapsed Days: 17
Plan Fractions Treated to Date: 14
Plan Prescribed Dose Per Fraction: 2.5 Gy
Plan Total Fractions Prescribed: 20
Plan Total Prescribed Dose: 50 Gy
Reference Point Dosage Given to Date: 35 Gy
Reference Point Session Dosage Given: 2.5 Gy
Session Number: 14

## 2024-03-12 ENCOUNTER — Other Ambulatory Visit: Payer: Self-pay

## 2024-03-12 ENCOUNTER — Ambulatory Visit
Admission: RE | Admit: 2024-03-12 | Discharge: 2024-03-12 | Disposition: A | Discharge status: Home / Self Care | Payer: Medicare PPO | Source: Ambulatory Visit | Attending: Radiation Oncology | Admitting: Radiation Oncology

## 2024-03-12 DIAGNOSIS — C44212 Basal cell carcinoma of skin of right ear and external auricular canal: Secondary | ICD-10-CM | POA: Diagnosis not present

## 2024-03-12 DIAGNOSIS — J910 Malignant pleural effusion: Secondary | ICD-10-CM | POA: Diagnosis not present

## 2024-03-12 DIAGNOSIS — Z5112 Encounter for antineoplastic immunotherapy: Secondary | ICD-10-CM | POA: Diagnosis not present

## 2024-03-12 DIAGNOSIS — C641 Malignant neoplasm of right kidney, except renal pelvis: Secondary | ICD-10-CM | POA: Diagnosis not present

## 2024-03-12 DIAGNOSIS — Z510 Encounter for antineoplastic radiation therapy: Secondary | ICD-10-CM | POA: Diagnosis not present

## 2024-03-12 DIAGNOSIS — Z79620 Long term (current) use of immunosuppressive biologic: Secondary | ICD-10-CM | POA: Diagnosis not present

## 2024-03-12 LAB — RAD ONC ARIA SESSION SUMMARY
Course Elapsed Days: 18
Plan Fractions Treated to Date: 15
Plan Prescribed Dose Per Fraction: 2.5 Gy
Plan Total Fractions Prescribed: 20
Plan Total Prescribed Dose: 50 Gy
Reference Point Dosage Given to Date: 37.5 Gy
Reference Point Session Dosage Given: 2.5 Gy
Session Number: 15

## 2024-03-15 ENCOUNTER — Ambulatory Visit
Admission: RE | Admit: 2024-03-15 | Discharge: 2024-03-15 | Disposition: A | Discharge status: Home / Self Care | Payer: Medicare PPO | Source: Ambulatory Visit | Attending: Radiation Oncology | Admitting: Radiation Oncology

## 2024-03-15 ENCOUNTER — Ambulatory Visit: Payer: Medicare PPO

## 2024-03-15 ENCOUNTER — Other Ambulatory Visit: Payer: Self-pay

## 2024-03-15 DIAGNOSIS — C641 Malignant neoplasm of right kidney, except renal pelvis: Secondary | ICD-10-CM | POA: Diagnosis not present

## 2024-03-15 DIAGNOSIS — Z5112 Encounter for antineoplastic immunotherapy: Secondary | ICD-10-CM | POA: Diagnosis not present

## 2024-03-15 DIAGNOSIS — J910 Malignant pleural effusion: Secondary | ICD-10-CM | POA: Diagnosis not present

## 2024-03-15 DIAGNOSIS — Z510 Encounter for antineoplastic radiation therapy: Secondary | ICD-10-CM | POA: Diagnosis not present

## 2024-03-15 DIAGNOSIS — Z79620 Long term (current) use of immunosuppressive biologic: Secondary | ICD-10-CM | POA: Diagnosis not present

## 2024-03-15 DIAGNOSIS — C44212 Basal cell carcinoma of skin of right ear and external auricular canal: Secondary | ICD-10-CM | POA: Diagnosis not present

## 2024-03-15 LAB — RAD ONC ARIA SESSION SUMMARY
Course Elapsed Days: 21
Plan Fractions Treated to Date: 16
Plan Prescribed Dose Per Fraction: 2.5 Gy
Plan Total Fractions Prescribed: 20
Plan Total Prescribed Dose: 50 Gy
Reference Point Dosage Given to Date: 40 Gy
Reference Point Session Dosage Given: 2.5 Gy
Session Number: 16

## 2024-03-16 ENCOUNTER — Ambulatory Visit: Admitting: Radiation Oncology

## 2024-03-16 ENCOUNTER — Other Ambulatory Visit: Payer: Self-pay

## 2024-03-16 ENCOUNTER — Ambulatory Visit: Payer: Medicare PPO

## 2024-03-16 ENCOUNTER — Ambulatory Visit
Admission: RE | Admit: 2024-03-16 | Discharge: 2024-03-16 | Disposition: A | Discharge status: Home / Self Care | Payer: Medicare PPO | Source: Ambulatory Visit | Attending: Radiation Oncology

## 2024-03-16 DIAGNOSIS — Z5112 Encounter for antineoplastic immunotherapy: Secondary | ICD-10-CM | POA: Diagnosis not present

## 2024-03-16 DIAGNOSIS — C44212 Basal cell carcinoma of skin of right ear and external auricular canal: Secondary | ICD-10-CM | POA: Diagnosis not present

## 2024-03-16 DIAGNOSIS — C641 Malignant neoplasm of right kidney, except renal pelvis: Secondary | ICD-10-CM | POA: Diagnosis not present

## 2024-03-16 DIAGNOSIS — Z79620 Long term (current) use of immunosuppressive biologic: Secondary | ICD-10-CM | POA: Diagnosis not present

## 2024-03-16 DIAGNOSIS — Z510 Encounter for antineoplastic radiation therapy: Secondary | ICD-10-CM | POA: Diagnosis not present

## 2024-03-16 DIAGNOSIS — J910 Malignant pleural effusion: Secondary | ICD-10-CM | POA: Diagnosis not present

## 2024-03-16 LAB — RAD ONC ARIA SESSION SUMMARY
Course Elapsed Days: 22
Plan Fractions Treated to Date: 17
Plan Prescribed Dose Per Fraction: 2.5 Gy
Plan Total Fractions Prescribed: 20
Plan Total Prescribed Dose: 50 Gy
Reference Point Dosage Given to Date: 42.5 Gy
Reference Point Session Dosage Given: 2.5 Gy
Session Number: 17

## 2024-03-17 ENCOUNTER — Other Ambulatory Visit: Payer: Self-pay

## 2024-03-17 ENCOUNTER — Ambulatory Visit
Admission: RE | Admit: 2024-03-17 | Discharge: 2024-03-17 | Disposition: A | Discharge status: Home / Self Care | Payer: Medicare PPO | Source: Ambulatory Visit | Attending: Radiation Oncology | Admitting: Radiation Oncology

## 2024-03-17 DIAGNOSIS — C641 Malignant neoplasm of right kidney, except renal pelvis: Secondary | ICD-10-CM | POA: Diagnosis not present

## 2024-03-17 DIAGNOSIS — Z510 Encounter for antineoplastic radiation therapy: Secondary | ICD-10-CM | POA: Diagnosis not present

## 2024-03-17 DIAGNOSIS — Z5112 Encounter for antineoplastic immunotherapy: Secondary | ICD-10-CM | POA: Diagnosis not present

## 2024-03-17 DIAGNOSIS — J910 Malignant pleural effusion: Secondary | ICD-10-CM | POA: Diagnosis not present

## 2024-03-17 DIAGNOSIS — Z79620 Long term (current) use of immunosuppressive biologic: Secondary | ICD-10-CM | POA: Diagnosis not present

## 2024-03-17 DIAGNOSIS — C44212 Basal cell carcinoma of skin of right ear and external auricular canal: Secondary | ICD-10-CM | POA: Diagnosis not present

## 2024-03-17 LAB — RAD ONC ARIA SESSION SUMMARY
Course Elapsed Days: 23
Plan Fractions Treated to Date: 18
Plan Prescribed Dose Per Fraction: 2.5 Gy
Plan Total Fractions Prescribed: 20
Plan Total Prescribed Dose: 50 Gy
Reference Point Dosage Given to Date: 45 Gy
Reference Point Session Dosage Given: 2.5 Gy
Session Number: 18

## 2024-03-18 ENCOUNTER — Ambulatory Visit
Admission: RE | Admit: 2024-03-18 | Discharge: 2024-03-18 | Disposition: A | Discharge status: Home / Self Care | Payer: Medicare PPO | Source: Ambulatory Visit | Attending: Radiation Oncology | Admitting: Radiation Oncology

## 2024-03-18 ENCOUNTER — Other Ambulatory Visit: Payer: Self-pay

## 2024-03-18 ENCOUNTER — Ambulatory Visit
Admission: RE | Admit: 2024-03-18 | Discharge: 2024-03-18 | Disposition: A | Discharge status: Home / Self Care | Source: Ambulatory Visit | Attending: Radiation Oncology | Admitting: Radiation Oncology

## 2024-03-18 DIAGNOSIS — C44212 Basal cell carcinoma of skin of right ear and external auricular canal: Secondary | ICD-10-CM | POA: Diagnosis not present

## 2024-03-18 DIAGNOSIS — Z510 Encounter for antineoplastic radiation therapy: Secondary | ICD-10-CM | POA: Diagnosis not present

## 2024-03-18 DIAGNOSIS — C641 Malignant neoplasm of right kidney, except renal pelvis: Secondary | ICD-10-CM | POA: Diagnosis not present

## 2024-03-18 DIAGNOSIS — J910 Malignant pleural effusion: Secondary | ICD-10-CM | POA: Diagnosis not present

## 2024-03-18 DIAGNOSIS — Z5112 Encounter for antineoplastic immunotherapy: Secondary | ICD-10-CM | POA: Diagnosis not present

## 2024-03-18 DIAGNOSIS — Z79620 Long term (current) use of immunosuppressive biologic: Secondary | ICD-10-CM | POA: Diagnosis not present

## 2024-03-18 LAB — RAD ONC ARIA SESSION SUMMARY
Course Elapsed Days: 24
Plan Fractions Treated to Date: 19
Plan Prescribed Dose Per Fraction: 2.5 Gy
Plan Total Fractions Prescribed: 20
Plan Total Prescribed Dose: 50 Gy
Reference Point Dosage Given to Date: 47.5 Gy
Reference Point Session Dosage Given: 2.5 Gy
Session Number: 19

## 2024-03-19 ENCOUNTER — Other Ambulatory Visit: Payer: Self-pay

## 2024-03-19 ENCOUNTER — Ambulatory Visit
Admission: RE | Admit: 2024-03-19 | Discharge: 2024-03-19 | Disposition: A | Discharge status: Home / Self Care | Payer: Medicare PPO | Source: Ambulatory Visit | Attending: Radiation Oncology | Admitting: Radiation Oncology

## 2024-03-19 DIAGNOSIS — Z79620 Long term (current) use of immunosuppressive biologic: Secondary | ICD-10-CM | POA: Diagnosis not present

## 2024-03-19 DIAGNOSIS — C44212 Basal cell carcinoma of skin of right ear and external auricular canal: Secondary | ICD-10-CM | POA: Diagnosis not present

## 2024-03-19 DIAGNOSIS — J910 Malignant pleural effusion: Secondary | ICD-10-CM | POA: Diagnosis not present

## 2024-03-19 DIAGNOSIS — Z510 Encounter for antineoplastic radiation therapy: Secondary | ICD-10-CM | POA: Diagnosis not present

## 2024-03-19 DIAGNOSIS — Z5112 Encounter for antineoplastic immunotherapy: Secondary | ICD-10-CM | POA: Diagnosis not present

## 2024-03-19 DIAGNOSIS — C641 Malignant neoplasm of right kidney, except renal pelvis: Secondary | ICD-10-CM | POA: Diagnosis not present

## 2024-03-19 LAB — RAD ONC ARIA SESSION SUMMARY
Course Elapsed Days: 25
Plan Fractions Treated to Date: 20
Plan Prescribed Dose Per Fraction: 2.5 Gy
Plan Total Fractions Prescribed: 20
Plan Total Prescribed Dose: 50 Gy
Reference Point Dosage Given to Date: 50 Gy
Reference Point Session Dosage Given: 2.5 Gy
Session Number: 20

## 2024-03-22 ENCOUNTER — Ambulatory Visit: Payer: Medicare PPO

## 2024-03-22 DIAGNOSIS — E7800 Pure hypercholesterolemia, unspecified: Secondary | ICD-10-CM | POA: Diagnosis not present

## 2024-03-22 DIAGNOSIS — Z131 Encounter for screening for diabetes mellitus: Secondary | ICD-10-CM | POA: Diagnosis not present

## 2024-03-22 DIAGNOSIS — Z79899 Other long term (current) drug therapy: Secondary | ICD-10-CM | POA: Diagnosis not present

## 2024-03-22 DIAGNOSIS — E538 Deficiency of other specified B group vitamins: Secondary | ICD-10-CM | POA: Diagnosis not present

## 2024-03-22 DIAGNOSIS — E559 Vitamin D deficiency, unspecified: Secondary | ICD-10-CM | POA: Diagnosis not present

## 2024-03-22 NOTE — Radiation Completion Notes (Signed)
 Patient Name: GERALDYNE, BARRACLOUGH MRN: [id number] Date of Birth: 12/21/1951 Referring Physician: Luna Kitchens, M.D. Date of Service: 2024-03-22 Radiation Oncologist: Lance Bosch, M.D. [facility]                             RADIATION ONCOLOGY END OF TREATMENT NOTE     Diagnosis: C44.212 Basal cell carcinoma of skin of right ear and external auricular canal Staging on 2021-01-03: Renal cell carcinoma (HCC) T=cT1b, N=cN0, M=pM1 Intent: Curative     ==========DELIVERED PLANS==========  First Treatment Date: 2024-02-23 Last Treatment Date: 2024-03-19   Plan Name: [email or web address] Site: [facility] Messing, Right Technique: Electron Mode: Electron Dose Per Fraction: 2.5 Gy Prescribed Dose (Delivered / Prescribed): 50 Gy / 50 Gy Prescribed Fxs (Delivered / Prescribed): 20 / 20     ==========ON TREATMENT VISIT DATES========== 2024-02-24, 2024-03-02, 2024-03-09, 2024-03-11     ==========UPCOMING VISITS==========       ==========APPENDIX - ON TREATMENT VISIT NOTES==========   See weekly On Treatment Notes in Epic for details in the Media tab (listed as Progress notes on the On Treatment Visit Dates listed above).

## 2024-03-25 ENCOUNTER — Encounter: Payer: Self-pay | Admitting: Hematology and Oncology

## 2024-03-25 ENCOUNTER — Inpatient Hospital Stay: Admitting: Oncology

## 2024-03-25 ENCOUNTER — Inpatient Hospital Stay

## 2024-03-25 ENCOUNTER — Inpatient Hospital Stay: Attending: Oncology

## 2024-03-25 ENCOUNTER — Inpatient Hospital Stay (HOSPITAL_BASED_OUTPATIENT_CLINIC_OR_DEPARTMENT_OTHER): Admitting: Hematology and Oncology

## 2024-03-25 VITALS — BP 147/57 | HR 70 | Temp 98.1°F | Resp 14 | Ht 61.0 in | Wt 139.1 lb

## 2024-03-25 DIAGNOSIS — C641 Malignant neoplasm of right kidney, except renal pelvis: Secondary | ICD-10-CM | POA: Diagnosis not present

## 2024-03-25 DIAGNOSIS — D3001 Benign neoplasm of right kidney: Secondary | ICD-10-CM

## 2024-03-25 DIAGNOSIS — Z79620 Long term (current) use of immunosuppressive biologic: Secondary | ICD-10-CM | POA: Diagnosis not present

## 2024-03-25 DIAGNOSIS — Z5112 Encounter for antineoplastic immunotherapy: Secondary | ICD-10-CM | POA: Insufficient documentation

## 2024-03-25 LAB — CMP (CANCER CENTER ONLY)
ALT: 21 U/L (ref 0–44)
AST: 27 U/L (ref 15–41)
Albumin: 4.4 g/dL (ref 3.5–5.0)
Alkaline Phosphatase: 74 U/L (ref 38–126)
Anion gap: 11 (ref 5–15)
BUN: 11 mg/dL (ref 8–23)
CO2: 26 mmol/L (ref 22–32)
Calcium: 8.9 mg/dL (ref 8.9–10.3)
Chloride: 104 mmol/L (ref 98–111)
Creatinine: 0.72 mg/dL (ref 0.44–1.00)
GFR, Estimated: >60 mL/min (ref >60)
Glucose, Bld: 112 mg/dL — ABNORMAL HIGH (ref 70–99)
Potassium: 3.9 mmol/L (ref 3.5–5.1)
Sodium: 140 mmol/L (ref 135–145)
Total Bilirubin: 0.3 mg/dL (ref 0.0–1.2)
Total Protein: 6.7 g/dL (ref 6.5–8.1)

## 2024-03-25 LAB — CBC WITH DIFFERENTIAL (CANCER CENTER ONLY)
Abs Immature Granulocytes: 0.01 10*3/uL (ref 0.00–0.07)
Basophils Absolute: 0.1 10*3/uL (ref 0.0–0.1)
Basophils Relative: 1 %
Eosinophils Absolute: 0.3 10*3/uL (ref 0.0–0.5)
Eosinophils Relative: 5 %
HCT: 39.3 % (ref 36.0–46.0)
Hemoglobin: 13.1 g/dL (ref 12.0–15.0)
Immature Granulocytes: 0 %
Lymphocytes Relative: 23 %
Lymphs Abs: 1.4 10*3/uL (ref 0.7–4.0)
MCH: 32.2 pg (ref 26.0–34.0)
MCHC: 33.3 g/dL (ref 30.0–36.0)
MCV: 96.6 fL (ref 80.0–100.0)
Monocytes Absolute: 0.6 10*3/uL (ref 0.1–1.0)
Monocytes Relative: 10 %
Neutro Abs: 3.6 10*3/uL (ref 1.7–7.7)
Neutrophils Relative %: 61 %
Platelet Count: 207 10*3/uL (ref 150–400)
RBC: 4.07 MIL/uL (ref 3.87–5.11)
RDW: 14 % (ref 11.5–15.5)
WBC Count: 5.9 10*3/uL (ref 4.0–10.5)
nRBC: 0 % (ref 0.0–0.2)
nRBC: 0 /100{WBCs}

## 2024-03-25 LAB — TSH: TSH: 1.842 u[IU]/mL (ref 0.350–4.500)

## 2024-03-25 MED ORDER — HEPARIN SOD (PORK) LOCK FLUSH 100 UNIT/ML IV SOLN
500.0000 [IU] | Freq: Once | INTRAVENOUS | Status: AC | PRN
  Administered 2024-03-25: 500 [IU]

## 2024-03-25 MED ORDER — SODIUM CHLORIDE 0.9 % IV SOLN
480.0000 mg | Freq: Once | INTRAVENOUS | Status: AC
  Administered 2024-03-25: 480 mg via INTRAVENOUS
  Filled 2024-03-25: qty 48

## 2024-03-25 MED ORDER — SODIUM CHLORIDE 0.9 % IV SOLN
Freq: Once | INTRAVENOUS | Status: AC
Start: 2024-03-25 — End: 2024-03-25

## 2024-03-25 MED ORDER — SODIUM CHLORIDE 0.9% FLUSH
10.0000 mL | INTRAVENOUS | Status: DC | PRN
  Administered 2024-03-25: 10 mL

## 2024-03-25 NOTE — Patient Instructions (Signed)
 Nivolumab Injection What is this medication? NIVOLUMAB (nye VOL ue mab) treats some types of cancer. It works by helping your immune system slow or stop the spread of cancer cells. It is a monoclonal antibody. This medicine may be used for other purposes; ask your health care provider or pharmacist if you have questions. COMMON BRAND NAME(S): Opdivo What should I tell my care team before I take this medication? They need to know if you have any of these conditions: Allogeneic stem cell transplant (uses someone else's stem cells) Autoimmune diseases, such as Crohn disease, ulcerative colitis, lupus History of chest radiation Nervous system problems, such as Guillain-Barre syndrome or myasthenia gravis Organ transplant An unusual or allergic reaction to nivolumab, other medications, foods, dyes, or preservatives Pregnant or trying to get pregnant Breast-feeding How should I use this medication? This medication is infused into a vein. It is given in a hospital or clinic setting. A special MedGuide will be given to you before each treatment. Be sure to read this information carefully each time. Talk to your care team about the use of this medication in children. While it may be prescribed for children as young as 12 years for selected conditions, precautions do apply. Overdosage: If you think you have taken too much of this medicine contact a poison control center or emergency room at once. NOTE: This medicine is only for you. Do not share this medicine with others. What if I miss a dose? Keep appointments for follow-up doses. It is important not to miss your dose. Call your care team if you are unable to keep an appointment. What may interact with this medication? Interactions have not been studied. This list may not describe all possible interactions. Give your health care provider a list of all the medicines, herbs, non-prescription drugs, or dietary supplements you use. Also tell them if you  smoke, drink alcohol, or use illegal drugs. Some items may interact with your medicine. What should I watch for while using this medication? Your condition will be monitored carefully while you are receiving this medication. You may need blood work while taking this medication. This medication may cause serious skin reactions. They can happen weeks to months after starting the medication. Contact your care team right away if you notice fevers or flu-like symptoms with a rash. The rash may be red or purple and then turn into blisters or peeling of the skin. You may also notice a red rash with swelling of the face, lips, or lymph nodes in your neck or under your arms. Tell your care team right away if you have any change in your eyesight. Talk to your care team if you are pregnant or think you might be pregnant. A negative pregnancy test is required before starting this medication. A reliable form of contraception is recommended while taking this medication and for 5 months after the last dose. Talk to your care team about effective forms of contraception. Do not breast-feed while taking this medication and for 5 months after the last dose. What side effects may I notice from receiving this medication? Side effects that you should report to your care team as soon as possible: Allergic reactions--skin rash, itching, hives, swelling of the face, lips, tongue, or throat Dry cough, shortness of breath or trouble breathing Eye pain, redness, irritation, or discharge with blurry or decreased vision Heart muscle inflammation--unusual weakness or fatigue, shortness of breath, chest pain, fast or irregular heartbeat, dizziness, swelling of the ankles, feet, or hands Hormone  gland problems--headache, sensitivity to light, unusual weakness or fatigue, dizziness, fast or irregular heartbeat, increased sensitivity to cold or heat, excessive sweating, constipation, hair loss, increased thirst or amount of urine,  tremors or shaking, irritability Infusion reactions--chest pain, shortness of breath or trouble breathing, feeling faint or lightheaded Kidney injury (glomerulonephritis)--decrease in the amount of urine, red or dark brown urine, foamy or bubbly urine, swelling of the ankles, hands, or feet Liver injury--right upper belly pain, loss of appetite, nausea, light-colored stool, dark yellow or brown urine, yellowing skin or eyes, unusual weakness or fatigue Pain, tingling, or numbness in the hands or feet, muscle weakness, change in vision, confusion or trouble speaking, loss of balance or coordination, trouble walking, seizures Rash, fever, and swollen lymph nodes Redness, blistering, peeling, or loosening of the skin, including inside the mouth Sudden or severe stomach pain, bloody diarrhea, fever, nausea, vomiting Side effects that usually do not require medical attention (report these to your care team if they continue or are bothersome): Bone, joint, or muscle pain Diarrhea Fatigue Loss of appetite Nausea Skin rash This list may not describe all possible side effects. Call your doctor for medical advice about side effects. You may report side effects to FDA at 1-800-FDA-1088. Where should I keep my medication? This medication is given in a hospital or clinic. It will not be stored at home. NOTE: This sheet is a summary. It may not cover all possible information. If you have questions about this medicine, talk to your doctor, pharmacist, or health care provider.  2024 Elsevier/Gold Standard (2022-04-08 00:00:00)

## 2024-03-25 NOTE — Progress Notes (Cosign Needed)
 [facility]  [address] [place],  [place]  [address] [phone]  Clinic Day:  03/25/2024  Referring physician: Lise Auer, MD   HISTORY OF PRESENT ILLNESS:  The patient is a 73 y.o. female with metastatic renal cell carcinoma, proven per cytology from a right-sided thoracentesis in early December 2021.  Most recent CT scan in January showed no obvious evidence of disease progression.  She comes in today to be evaluated before heading into her 42nd cycle of maintenance nivolumab. The patient tolerated her 41st cycle well without having any significant difficulties.  Prior to her 41st cycle, she had increased cough, which appeared to be infectious.  This nearly resolved with azithromycin.  She still has occasional dry cough.  She denies increased shortness of breath.  She denies fevers, chills or night sweats.   This patient's maintenance nivolumab was preceded by 3 cycles of nivolumab/ipilimumab.  A 4th cycle was not given due to her being hospitalized for severe colitis related to the ipilimumab component of her combination immunotherapy.      PHYSICAL EXAM:  Blood pressure (!) 147/57, pulse 70, temperature 98.1 F (36.7 C), temperature source Oral, resp. rate 14, height 5\' 1"  (1.549 m), weight 139 lb 1.6 oz (63.1 kg), SpO2 99%. Wt Readings from Last 3 Encounters:  03/25/24 139 lb 1.6 oz (63.1 kg)  02/26/24 141 lb 8 oz (64.2 kg)  01/29/24 145 lb 3.2 oz (65.9 kg)   Body mass index is 26.28 kg/m.  Performance status (ECOG): 1 - Symptomatic but completely ambulatory  Physical Exam Vitals and nursing note reviewed.  Constitutional:      General: She is not in acute distress.    Appearance: Normal appearance. She is not ill-appearing.  HENT:     Head: Normocephalic and atraumatic.     Mouth/Throat:     Mouth: Mucous membranes are moist.     Pharynx: Oropharynx is clear. No oropharyngeal exudate or posterior oropharyngeal erythema.  Eyes:     General: No  scleral icterus.    Extraocular Movements: Extraocular movements intact.     Conjunctiva/sclera: Conjunctivae normal.     Pupils: Pupils are equal, round, and reactive to light.  Cardiovascular:     Rate and Rhythm: Normal rate and regular rhythm.     Heart sounds: Normal heart sounds. No murmur heard.    No friction rub. No gallop.  Pulmonary:     Effort: Pulmonary effort is normal.     Breath sounds: Examination of the right-lower field reveals decreased breath sounds. Decreased breath sounds present. No wheezing, rhonchi or rales.  Abdominal:     General: There is no distension.     Palpations: Abdomen is soft. There is no mass.     Tenderness: There is no abdominal tenderness.  Musculoskeletal:        General: Normal range of motion.     Cervical back: Normal range of motion and neck supple. No tenderness.     Right lower leg: No edema.     Left lower leg: No edema.  Lymphadenopathy:     Cervical: No cervical adenopathy.  Skin:    General: Skin is warm and dry.     Coloration: Skin is not jaundiced.     Findings: No rash.  Neurological:     Mental Status: She is alert and oriented to person, place, and time.     Cranial Nerves: No cranial nerve deficit.  Psychiatric:  Mood and Affect: Mood normal.        Behavior: Behavior normal.        Thought Content: Thought content normal.     LABS:      Latest Ref Rng & Units 03/25/2024   10:29 AM 02/26/2024   10:31 AM 01/22/2024   10:14 AM  CBC  WBC 4.0 - 10.5 K/uL 5.9  6.3  5.7   Hemoglobin 12.0 - 15.0 g/dL 16.1  09.6  04.5   Hematocrit 36.0 - 46.0 % 39.3  38.0  38.8   Platelets 150 - 400 K/uL 207  224  180       Latest Ref Rng & Units 03/25/2024   10:29 AM 02/26/2024   10:31 AM 01/22/2024   10:14 AM  CMP  Glucose 70 - 99 mg/dL 409  811  914   BUN 8 - 23 mg/dL 11  12  11    Creatinine 0.44 - 1.00 mg/dL 7.82  9.56  2.13   Sodium 135 - 145 mmol/L 140  141  140   Potassium 3.5 - 5.1 mmol/L 3.9  4.0  3.7   Chloride 98 -  111 mmol/L 104  105  104   CO2 22 - 32 mmol/L 26  24  23    Calcium 8.9 - 10.3 mg/dL 8.9  9.0  9.2   Total Protein 6.5 - 8.1 g/dL 6.7  6.7  6.8   Total Bilirubin 0.0 - 1.2 mg/dL 0.3  0.2  0.3   Alkaline Phos 38 - 126 U/L 74  76  73   AST 15 - 41 U/L 27  26  29    ALT 0 - 44 U/L 21  22  23      Lab Results  Component Value Date   LDH 180 11/28/2020       Component Value Date/Time   LDH 180 11/28/2020 0955    Review Flowsheet       Latest Ref Rng & Units 11/28/2020  Oncology Labs  LDH 120 - 250 U/L 180      STUDIES:  No results found.    ASSESSMENT & PLAN:   Assessment/Plan:  73 y.o. female with metastatic renal cell carcinoma.  Overall, she is doing fairly well.  She still has a mild dry cough, which has improved.  She will proceed with her 42nd cycle of nivolumab.  We will plan to see her back in 4 weeks prior to a 43rd cycle.  The patient understands all the plans discussed today and is in agreement with them.  She knows to contact our office if she develops concerns prior to her next appointment.     Adah Perl, PA-C   Physician Assistant [facility] [place] [phone]

## 2024-03-26 DIAGNOSIS — Z6824 Body mass index (BMI) 24.0-24.9, adult: Secondary | ICD-10-CM | POA: Diagnosis not present

## 2024-03-26 DIAGNOSIS — Z9181 History of falling: Secondary | ICD-10-CM | POA: Diagnosis not present

## 2024-03-26 DIAGNOSIS — Z0000 Encounter for general adult medical examination without abnormal findings: Secondary | ICD-10-CM | POA: Diagnosis not present

## 2024-03-26 DIAGNOSIS — Z1339 Encounter for screening examination for other mental health and behavioral disorders: Secondary | ICD-10-CM | POA: Diagnosis not present

## 2024-03-26 DIAGNOSIS — F4320 Adjustment disorder, unspecified: Secondary | ICD-10-CM | POA: Diagnosis not present

## 2024-03-26 DIAGNOSIS — E7800 Pure hypercholesterolemia, unspecified: Secondary | ICD-10-CM | POA: Diagnosis not present

## 2024-03-26 DIAGNOSIS — C641 Malignant neoplasm of right kidney, except renal pelvis: Secondary | ICD-10-CM | POA: Diagnosis not present

## 2024-03-26 DIAGNOSIS — H81399 Other peripheral vertigo, unspecified ear: Secondary | ICD-10-CM | POA: Diagnosis not present

## 2024-03-26 DIAGNOSIS — M4800 Spinal stenosis, site unspecified: Secondary | ICD-10-CM | POA: Diagnosis not present

## 2024-03-26 LAB — T4: T4, Total: 5.3 ug/dL (ref 4.5–12.0)

## 2024-03-29 DIAGNOSIS — J3089 Other allergic rhinitis: Secondary | ICD-10-CM | POA: Diagnosis not present

## 2024-03-29 DIAGNOSIS — H8109 Meniere's disease, unspecified ear: Secondary | ICD-10-CM | POA: Diagnosis not present

## 2024-03-29 DIAGNOSIS — H903 Sensorineural hearing loss, bilateral: Secondary | ICD-10-CM | POA: Diagnosis not present

## 2024-04-02 ENCOUNTER — Other Ambulatory Visit: Payer: Self-pay

## 2024-04-07 ENCOUNTER — Other Ambulatory Visit: Payer: Self-pay

## 2024-04-20 ENCOUNTER — Other Ambulatory Visit: Payer: Self-pay | Admitting: Oncology

## 2024-04-20 DIAGNOSIS — D3001 Benign neoplasm of right kidney: Secondary | ICD-10-CM

## 2024-04-21 NOTE — Progress Notes (Signed)
 [facility]  [address] [place],  [place]  [address] [phone]  Clinic Day:  04/22/2024  Referring physician: Beecher Bower, MD  HISTORY OF PRESENT ILLNESS:  The patient is a 73 y.o. female with metastatic renal cell carcinoma, proven per cytology from a right-sided thoracentesis in early December 2021.  She comes in today to be evaluated before heading into her 39th cycle of maintenance nivolumab . The patient tolerated her 38th cycle well without having any significant difficulties.  She denies having any worsening cough, shortness of breath, or other systemic symptoms which concern her for overt signs of disease progression.  This patient's maintenance nivolumab  was preceded by 3 cycles of nivolumab /ipilimumab .  A 4th cycle was not given due to her being hospitalized for severe colitis related to the ipilimumab  component of her combination immunotherapy.     PHYSICAL EXAM:  Blood pressure 137/89, pulse (!) 59. Wt Readings from Last 3 Encounters:  03/25/24 139 lb 1.6 oz (63.1 kg)  02/26/24 141 lb 8 oz (64.2 kg)  01/29/24 145 lb 3.2 oz (65.9 kg)   There is no height or weight on file to calculate BMI. Performance status (ECOG): 1 - Symptomatic but completely ambulatory Physical Exam Constitutional:      Appearance: Normal appearance. She is not ill-appearing.  HENT:     Mouth/Throat:     Mouth: Mucous membranes are moist.     Pharynx: Oropharynx is clear. No oropharyngeal exudate or posterior oropharyngeal erythema.  Cardiovascular:     Rate and Rhythm: Normal rate and regular rhythm.     Heart sounds: No murmur heard.    No friction rub. No gallop.  Pulmonary:     Effort: Pulmonary effort is normal. No respiratory distress.     Breath sounds: Examination of the right-lower field reveals decreased breath sounds. Decreased breath sounds present. No wheezing, rhonchi or rales.  Abdominal:     General: Bowel sounds are normal. There is no  distension.     Palpations: Abdomen is soft. There is no mass.     Tenderness: There is no abdominal tenderness.  Musculoskeletal:        General: No swelling.     Right lower leg: No edema.     Left lower leg: No edema.  Lymphadenopathy:     Cervical: No cervical adenopathy.     Upper Body:     Right upper body: No supraclavicular or axillary adenopathy.     Left upper body: No supraclavicular or axillary adenopathy.     Lower Body: No right inguinal adenopathy. No left inguinal adenopathy.  Skin:    General: Skin is warm.     Coloration: Skin is not jaundiced.     Findings: No lesion or rash.  Neurological:     General: No focal deficit present.     Mental Status: She is alert and oriented to person, place, and time. Mental status is at baseline.  Psychiatric:        Mood and Affect: Mood normal.        Behavior: Behavior normal.        Thought Content: Thought content normal.    LABS:      Latest Ref Rng & Units 04/22/2024   10:18 AM 03/25/2024   10:29 AM 02/26/2024   10:31 AM  CBC  WBC 4.0 - 10.5 K/uL 6.2  5.9  6.3   Hemoglobin 12.0 - 15.0 g/dL 91.4  78.2  95.6   Hematocrit  36.0 - 46.0 % 38.8  39.3  38.0   Platelets 150 - 400 K/uL 188  207  224       Latest Ref Rng & Units 04/22/2024   10:18 AM 03/25/2024   10:29 AM 02/26/2024   10:31 AM  CMP  Glucose 70 - 99 mg/dL 91  161  096   BUN 8 - 23 mg/dL 10  11  12    Creatinine 0.44 - 1.00 mg/dL 0.45  4.09  8.11   Sodium 135 - 145 mmol/L 141  140  141   Potassium 3.5 - 5.1 mmol/L 4.3  3.9  4.0   Chloride 98 - 111 mmol/L 104  104  105   CO2 22 - 32 mmol/L 25  26  24    Calcium 8.9 - 10.3 mg/dL 9.3  8.9  9.0   Total Protein 6.5 - 8.1 g/dL 6.7  6.7  6.7   Total Bilirubin 0.0 - 1.2 mg/dL 0.4  0.3  0.2   Alkaline Phos 38 - 126 U/L 70  74  76   AST 15 - 41 U/L 35  27  26   ALT 0 - 44 U/L 24  21  22     ASSESSMENT & PLAN:  Assessment/Plan:  A 73 y.o. female with metastatic renal cell carcinoma.  She will proceed with her 39th cycle  of maintenance nivolumab  today.  Clinically, the patient is doing okay.  I sense no signs of disease progression.  I will see her back in 4 weeks before she heads into her 40th cycle of maintenance nivolumab .  The patient understands all the plans discussed today and is in agreement with them.    Jonatha Gagen Felicia Horde, MD

## 2024-04-22 ENCOUNTER — Telehealth: Payer: Self-pay | Admitting: Oncology

## 2024-04-22 ENCOUNTER — Inpatient Hospital Stay

## 2024-04-22 ENCOUNTER — Inpatient Hospital Stay (HOSPITAL_BASED_OUTPATIENT_CLINIC_OR_DEPARTMENT_OTHER): Admitting: Oncology

## 2024-04-22 ENCOUNTER — Ambulatory Visit
Admission: RE | Admit: 2024-04-22 | Discharge: 2024-04-22 | Disposition: A | Discharge status: Home / Self Care | Payer: Self-pay | Source: Ambulatory Visit | Attending: Radiation Oncology | Admitting: Radiation Oncology

## 2024-04-22 ENCOUNTER — Inpatient Hospital Stay: Attending: Oncology

## 2024-04-22 VITALS — BP 137/89 | HR 59

## 2024-04-22 VITALS — BP 151/70 | HR 87 | Temp 98.5°F | Resp 16

## 2024-04-22 DIAGNOSIS — Z5112 Encounter for antineoplastic immunotherapy: Secondary | ICD-10-CM | POA: Diagnosis not present

## 2024-04-22 DIAGNOSIS — Z79620 Long term (current) use of immunosuppressive biologic: Secondary | ICD-10-CM | POA: Diagnosis not present

## 2024-04-22 DIAGNOSIS — C641 Malignant neoplasm of right kidney, except renal pelvis: Secondary | ICD-10-CM | POA: Diagnosis not present

## 2024-04-22 DIAGNOSIS — D3001 Benign neoplasm of right kidney: Secondary | ICD-10-CM

## 2024-04-22 DIAGNOSIS — C44212 Basal cell carcinoma of skin of right ear and external auricular canal: Secondary | ICD-10-CM

## 2024-04-22 LAB — CBC WITH DIFFERENTIAL (CANCER CENTER ONLY)
Abs Immature Granulocytes: 0.02 10*3/uL (ref 0.00–0.07)
Basophils Absolute: 0.1 10*3/uL (ref 0.0–0.1)
Basophils Relative: 1 %
Eosinophils Absolute: 0.4 10*3/uL (ref 0.0–0.5)
Eosinophils Relative: 6 %
HCT: 38.8 % (ref 36.0–46.0)
Hemoglobin: 13.2 g/dL (ref 12.0–15.0)
Immature Granulocytes: 0 %
Lymphocytes Relative: 27 %
Lymphs Abs: 1.7 10*3/uL (ref 0.7–4.0)
MCH: 32.9 pg (ref 26.0–34.0)
MCHC: 34 g/dL (ref 30.0–36.0)
MCV: 96.8 fL (ref 80.0–100.0)
Monocytes Absolute: 0.6 10*3/uL (ref 0.1–1.0)
Monocytes Relative: 10 %
Neutro Abs: 3.5 10*3/uL (ref 1.7–7.7)
Neutrophils Relative %: 56 %
Platelet Count: 188 10*3/uL (ref 150–400)
RBC: 4.01 MIL/uL (ref 3.87–5.11)
RDW: 13.7 % (ref 11.5–15.5)
WBC Count: 6.2 10*3/uL (ref 4.0–10.5)
nRBC: 0 % (ref 0.0–0.2)
nRBC: 0 /100{WBCs}

## 2024-04-22 LAB — CMP (CANCER CENTER ONLY)
ALT: 24 U/L (ref 0–44)
AST: 35 U/L (ref 15–41)
Albumin: 4.3 g/dL (ref 3.5–5.0)
Alkaline Phosphatase: 70 U/L (ref 38–126)
Anion gap: 11 (ref 5–15)
BUN: 10 mg/dL (ref 8–23)
CO2: 25 mmol/L (ref 22–32)
Calcium: 9.3 mg/dL (ref 8.9–10.3)
Chloride: 104 mmol/L (ref 98–111)
Creatinine: 0.75 mg/dL (ref 0.44–1.00)
GFR, Estimated: >60 mL/min (ref >60)
Glucose, Bld: 91 mg/dL (ref 70–99)
Potassium: 4.3 mmol/L (ref 3.5–5.1)
Sodium: 141 mmol/L (ref 135–145)
Total Bilirubin: 0.4 mg/dL (ref 0.0–1.2)
Total Protein: 6.7 g/dL (ref 6.5–8.1)

## 2024-04-22 LAB — TSH: TSH: 2.174 u[IU]/mL (ref 0.350–4.500)

## 2024-04-22 MED ORDER — SODIUM CHLORIDE 0.9 % IV SOLN
480.0000 mg | Freq: Once | INTRAVENOUS | Status: AC
  Administered 2024-04-22: 480 mg via INTRAVENOUS
  Filled 2024-04-22: qty 48

## 2024-04-22 MED ORDER — SODIUM CHLORIDE 0.9 % IV SOLN: Freq: Once | INTRAVENOUS | Status: AC

## 2024-04-22 MED ORDER — HEPARIN SOD (PORK) LOCK FLUSH 100 UNIT/ML IV SOLN
500.0000 [IU] | Freq: Once | INTRAVENOUS | Status: AC | PRN
  Administered 2024-04-22: 500 [IU]

## 2024-04-22 MED ORDER — SODIUM CHLORIDE 0.9% FLUSH
10.0000 mL | INTRAVENOUS | Status: DC | PRN
  Administered 2024-04-22: 10 mL

## 2024-04-22 NOTE — Patient Instructions (Signed)
 Nivolumab Injection What is this medication? NIVOLUMAB (nye VOL ue mab) treats some types of cancer. It works by helping your immune system slow or stop the spread of cancer cells. It is a monoclonal antibody. This medicine may be used for other purposes; ask your health care provider or pharmacist if you have questions. COMMON BRAND NAME(S): Opdivo What should I tell my care team before I take this medication? They need to know if you have any of these conditions: Allogeneic stem cell transplant (uses someone else's stem cells) Autoimmune diseases, such as Crohn disease, ulcerative colitis, lupus History of chest radiation Nervous system problems, such as Guillain-Barre syndrome or myasthenia gravis Organ transplant An unusual or allergic reaction to nivolumab, other medications, foods, dyes, or preservatives Pregnant or trying to get pregnant Breast-feeding How should I use this medication? This medication is infused into a vein. It is given in a hospital or clinic setting. A special MedGuide will be given to you before each treatment. Be sure to read this information carefully each time. Talk to your care team about the use of this medication in children. While it may be prescribed for children as young as 12 years for selected conditions, precautions do apply. Overdosage: If you think you have taken too much of this medicine contact a poison control center or emergency room at once. NOTE: This medicine is only for you. Do not share this medicine with others. What if I miss a dose? Keep appointments for follow-up doses. It is important not to miss your dose. Call your care team if you are unable to keep an appointment. What may interact with this medication? Interactions have not been studied. This list may not describe all possible interactions. Give your health care provider a list of all the medicines, herbs, non-prescription drugs, or dietary supplements you use. Also tell them if you  smoke, drink alcohol, or use illegal drugs. Some items may interact with your medicine. What should I watch for while using this medication? Your condition will be monitored carefully while you are receiving this medication. You may need blood work while taking this medication. This medication may cause serious skin reactions. They can happen weeks to months after starting the medication. Contact your care team right away if you notice fevers or flu-like symptoms with a rash. The rash may be red or purple and then turn into blisters or peeling of the skin. You may also notice a red rash with swelling of the face, lips, or lymph nodes in your neck or under your arms. Tell your care team right away if you have any change in your eyesight. Talk to your care team if you are pregnant or think you might be pregnant. A negative pregnancy test is required before starting this medication. A reliable form of contraception is recommended while taking this medication and for 5 months after the last dose. Talk to your care team about effective forms of contraception. Do not breast-feed while taking this medication and for 5 months after the last dose. What side effects may I notice from receiving this medication? Side effects that you should report to your care team as soon as possible: Allergic reactions--skin rash, itching, hives, swelling of the face, lips, tongue, or throat Dry cough, shortness of breath or trouble breathing Eye pain, redness, irritation, or discharge with blurry or decreased vision Heart muscle inflammation--unusual weakness or fatigue, shortness of breath, chest pain, fast or irregular heartbeat, dizziness, swelling of the ankles, feet, or hands Hormone  gland problems--headache, sensitivity to light, unusual weakness or fatigue, dizziness, fast or irregular heartbeat, increased sensitivity to cold or heat, excessive sweating, constipation, hair loss, increased thirst or amount of urine,  tremors or shaking, irritability Infusion reactions--chest pain, shortness of breath or trouble breathing, feeling faint or lightheaded Kidney injury (glomerulonephritis)--decrease in the amount of urine, red or dark brown urine, foamy or bubbly urine, swelling of the ankles, hands, or feet Liver injury--right upper belly pain, loss of appetite, nausea, light-colored stool, dark yellow or brown urine, yellowing skin or eyes, unusual weakness or fatigue Pain, tingling, or numbness in the hands or feet, muscle weakness, change in vision, confusion or trouble speaking, loss of balance or coordination, trouble walking, seizures Rash, fever, and swollen lymph nodes Redness, blistering, peeling, or loosening of the skin, including inside the mouth Sudden or severe stomach pain, bloody diarrhea, fever, nausea, vomiting Side effects that usually do not require medical attention (report these to your care team if they continue or are bothersome): Bone, joint, or muscle pain Diarrhea Fatigue Loss of appetite Nausea Skin rash This list may not describe all possible side effects. Call your doctor for medical advice about side effects. You may report side effects to FDA at 1-800-FDA-1088. Where should I keep my medication? This medication is given in a hospital or clinic. It will not be stored at home. NOTE: This sheet is a summary. It may not cover all possible information. If you have questions about this medicine, talk to your doctor, pharmacist, or health care provider.  2024 Elsevier/Gold Standard (2022-04-08 00:00:00)

## 2024-04-22 NOTE — Telephone Encounter (Signed)
 Patient has been scheduled for follow-up visit per 03/3024 LOS.  Pt given an appt calendar with date and time.

## 2024-04-23 ENCOUNTER — Other Ambulatory Visit: Payer: Self-pay

## 2024-04-23 LAB — T4: T4, Total: 5.5 ug/dL (ref 4.5–12.0)

## 2024-04-26 NOTE — Progress Notes (Signed)
   Department of Radiation Oncology    Followup Note    Name: Kamilly Heiserman Date: 04/26/2024 MRN: [id number] DOB: 03/23/1951   Diagnosis: Basal cell carcinoma of the right earlobe    MEDICATIONS: Current Outpatient Medications  Medication Sig Dispense Refill   albuterol (VENTOLIN HFA) 108 (90 Base) MCG/ACT inhaler Inhale 2 puffs into the lungs every 4 (four) hours as needed.     amitriptyline (ELAVIL) 25 MG tablet Take 25 mg by mouth daily.     Calcium Carbonate-Vitamin D (CALCIUM-VITAMIN D) 600-125 MG-UNIT TABS Take 1 tablet by mouth 2 (two) times daily.     Cholecalciferol (VITAMIN D) 50 MCG (2000 UT) CAPS Take 2,000 Units by mouth daily.     citalopram (CELEXA) 20 MG tablet Take 20 mg by mouth daily.     clobetasol ointment (TEMOVATE) 0.05 % Apply topically.     famotidine  (PEPCID ) 20 MG tablet Take 20 mg by mouth daily.     Fluocinolone Acetonide 0.01 % OIL      gabapentin (NEURONTIN) 100 MG capsule Take 200 mg by mouth at bedtime.     naproxen sodium (ALEVE) 220 MG tablet Take 220 mg by mouth 2 (two) times daily with a meal.     nivolumab  480 mg in sodium chloride  0.9 % 100 mL Inject into the vein.     rosuvastatin (CRESTOR) 10 MG tablet Take by mouth.     triamcinolone  cream (KENALOG ) 0.1 % Apply 1 Application topically 2 (two) times daily. 453.6 g 0   vitamin B-12 (CYANOCOBALAMIN ) 1000 MCG tablet Take 1,000 mcg by mouth daily.     No current facility-administered medications for this encounter.     ALLERGIES: Codeine, Meperidine, Gentamicin, Naphazoline, Neo-bacit-poly-lidocaine , Other, Polymyxin b, Pramoxine, Sulfa antibiotics, Bacitracin-neomycin-polymyxin, Bacitracin-polymyxin b, Dextrose, Fluorometholone, Metronidazole, Morphine and codeine, Neomycin-polymyxin-gramicidin, Oxycodone-aspirin, Prednisone, Tobramycin, and Tramadol hcl   LABORATORY DATA:  Lab Results  Component Value Date   WBC 6.2 04/22/2024   HGB 13.2 04/22/2024   HCT 38.8 04/22/2024   MCV  96.8 04/22/2024   PLT 188 04/22/2024   Lab Results  Component Value Date   NA 141 04/22/2024   K 4.3 04/22/2024   CL 104 04/22/2024   CO2 25 04/22/2024   Lab Results  Component Value Date   ALT 24 04/22/2024   AST 35 04/22/2024   ALKPHOS 70 04/22/2024   BILITOT 0.4 04/22/2024     NARRATIVE: [name] [name] was seen today in followup for her basal cell carcinoma.  She completed radiation approximately 4 weeks ago.  She reports no pain or itching involving the treated site.   PHYSICAL EXAMINATION:   Examination of her right earlobe reveals resolution of her erythema.  The earlobe is soft and normal in appearance.  It is not tender to palpation.  ASSESSMENT: The patient is doing very well approximately 4 weeks out from completion of radiation.  Her right earlobe mass has completely resolved, and her ear appears normal.  PLAN: The patient will follow up with me on an as needed basis.  She will continue to follow-up with Dr. Harles Lied.    Lyvia Mondesir A. Cathren Coaster, MD

## 2024-05-19 NOTE — Progress Notes (Unsigned)
 [facility]  [address] [place],  [place]  [address] [phone]  Clinic Day:  05/20/2024  Referring physician: Beecher Bower, MD  HISTORY OF PRESENT ILLNESS:  The patient is a 73 y.o. female with metastatic renal cell carcinoma, proven per cytology from a right-sided thoracentesis in early December 2021.  She comes in today to be evaluated before heading into her 40th cycle of maintenance nivolumab . The patient tolerated her 39th cycle well without having any significant difficulties.  She denies having any worsening cough, shortness of breath, or other systemic symptoms which concern her for overt signs of disease progression.  This patient's maintenance nivolumab  was preceded by 3 cycles of nivolumab /ipilimumab .  A 4th cycle was not given due to her being hospitalized for severe colitis related to the ipilimumab  component of her combination immunotherapy.     PHYSICAL EXAM:  Blood pressure (!) 157/66, pulse 72, temperature 98.5 F (36.9 C), temperature source Oral, resp. rate (!) 22, height 5\' 1"  (1.549 m), weight 141 lb (64 kg), SpO2 100%. Wt Readings from Last 3 Encounters:  05/20/24 141 lb (64 kg)  05/20/24 141 lb (64 kg)  03/25/24 139 lb 1.6 oz (63.1 kg)   Body mass index is 26.64 kg/m. Performance status (ECOG): 1 - Symptomatic but completely ambulatory Physical Exam Constitutional:      Appearance: Normal appearance. She is not ill-appearing.  HENT:     Mouth/Throat:     Mouth: Mucous membranes are moist.     Pharynx: Oropharynx is clear. No oropharyngeal exudate or posterior oropharyngeal erythema.  Cardiovascular:     Rate and Rhythm: Normal rate and regular rhythm.     Heart sounds: No murmur heard.    No friction rub. No gallop.  Pulmonary:     Effort: Pulmonary effort is normal. No respiratory distress.     Breath sounds: Examination of the right-lower field reveals decreased breath sounds. Decreased breath sounds present. No  wheezing, rhonchi or rales.  Abdominal:     General: Bowel sounds are normal. There is no distension.     Palpations: Abdomen is soft. There is no mass.     Tenderness: There is no abdominal tenderness.  Musculoskeletal:        General: No swelling.     Right lower leg: No edema.     Left lower leg: No edema.  Lymphadenopathy:     Cervical: No cervical adenopathy.     Upper Body:     Right upper body: No supraclavicular or axillary adenopathy.     Left upper body: No supraclavicular or axillary adenopathy.     Lower Body: No right inguinal adenopathy. No left inguinal adenopathy.  Skin:    General: Skin is warm.     Coloration: Skin is not jaundiced.     Findings: No lesion or rash.  Neurological:     General: No focal deficit present.     Mental Status: She is alert and oriented to person, place, and time. Mental status is at baseline.  Psychiatric:        Mood and Affect: Mood normal.        Behavior: Behavior normal.        Thought Content: Thought content normal.    LABS:      Latest Ref Rng & Units 05/20/2024   10:21 AM 04/22/2024   10:18 AM 03/25/2024   10:29 AM  CBC  WBC 4.0 - 10.5 K/uL 5.5  6.2  5.9  Hemoglobin 12.0 - 15.0 g/dL 16.1  09.6  04.5   Hematocrit 36.0 - 46.0 % 38.9  38.8  39.3   Platelets 150 - 400 K/uL 178  188  207       Latest Ref Rng & Units 05/20/2024   10:21 AM 04/22/2024   10:18 AM 03/25/2024   10:29 AM  CMP  Glucose 70 - 99 mg/dL 409  91  811   BUN 8 - 23 mg/dL 13  10  11    Creatinine 0.44 - 1.00 mg/dL 9.14  7.82  9.56   Sodium 135 - 145 mmol/L 141  141  140   Potassium 3.5 - 5.1 mmol/L 3.9  4.3  3.9   Chloride 98 - 111 mmol/L 106  104  104   CO2 22 - 32 mmol/L 23  25  26    Calcium 8.9 - 10.3 mg/dL 9.1  9.3  8.9   Total Protein 6.5 - 8.1 g/dL 6.4  6.7  6.7   Total Bilirubin 0.0 - 1.2 mg/dL 0.3  0.4  0.3   Alkaline Phos 38 - 126 U/L 71  70  74   AST 15 - 41 U/L 28  35  27   ALT 0 - 44 U/L 22  24  21     ASSESSMENT & PLAN:  Assessment/Plan:   A 73 y.o. female with metastatic renal cell carcinoma.  She will proceed with her 40th cycle of maintenance nivolumab  today.  Clinically, the patient is doing well.  I will see her back in 4 weeks before she heads into her 41st cycle of maintenance nivolumab .  The patient understands all the plans discussed today and is in agreement with them.    Onesimo Lingard Felicia Horde, MD

## 2024-05-20 ENCOUNTER — Other Ambulatory Visit: Payer: Self-pay | Admitting: Oncology

## 2024-05-20 ENCOUNTER — Inpatient Hospital Stay

## 2024-05-20 ENCOUNTER — Inpatient Hospital Stay: Admitting: Oncology

## 2024-05-20 ENCOUNTER — Other Ambulatory Visit: Payer: Self-pay

## 2024-05-20 VITALS — BP 157/66 | HR 72 | Temp 98.5°F | Resp 22 | Ht 61.0 in | Wt 141.0 lb

## 2024-05-20 VITALS — BP 154/57 | HR 72 | Temp 98.4°F | Resp 22 | Wt 141.0 lb

## 2024-05-20 DIAGNOSIS — D3001 Benign neoplasm of right kidney: Secondary | ICD-10-CM

## 2024-05-20 DIAGNOSIS — Z5112 Encounter for antineoplastic immunotherapy: Secondary | ICD-10-CM | POA: Diagnosis not present

## 2024-05-20 DIAGNOSIS — C641 Malignant neoplasm of right kidney, except renal pelvis: Secondary | ICD-10-CM | POA: Diagnosis not present

## 2024-05-20 DIAGNOSIS — Z79620 Long term (current) use of immunosuppressive biologic: Secondary | ICD-10-CM | POA: Diagnosis not present

## 2024-05-20 LAB — CBC WITH DIFFERENTIAL (CANCER CENTER ONLY)
Abs Immature Granulocytes: 0.02 10*3/uL (ref 0.00–0.07)
Basophils Absolute: 0 10*3/uL (ref 0.0–0.1)
Basophils Relative: 1 %
Eosinophils Absolute: 0.3 10*3/uL (ref 0.0–0.5)
Eosinophils Relative: 6 %
HCT: 38.9 % (ref 36.0–46.0)
Hemoglobin: 12.8 g/dL (ref 12.0–15.0)
Immature Granulocytes: 0 %
Lymphocytes Relative: 24 %
Lymphs Abs: 1.3 10*3/uL (ref 0.7–4.0)
MCH: 32.3 pg (ref 26.0–34.0)
MCHC: 32.9 g/dL (ref 30.0–36.0)
MCV: 98.2 fL (ref 80.0–100.0)
Monocytes Absolute: 0.5 10*3/uL (ref 0.1–1.0)
Monocytes Relative: 10 %
Neutro Abs: 3.3 10*3/uL (ref 1.7–7.7)
Neutrophils Relative %: 59 %
Platelet Count: 178 10*3/uL (ref 150–400)
RBC: 3.96 MIL/uL (ref 3.87–5.11)
RDW: 13.5 % (ref 11.5–15.5)
WBC Count: 5.5 10*3/uL (ref 4.0–10.5)
nRBC: 0 % (ref 0.0–0.2)

## 2024-05-20 LAB — CMP (CANCER CENTER ONLY)
ALT: 22 U/L (ref 0–44)
AST: 28 U/L (ref 15–41)
Albumin: 4.4 g/dL (ref 3.5–5.0)
Alkaline Phosphatase: 71 U/L (ref 38–126)
Anion gap: 12 (ref 5–15)
BUN: 13 mg/dL (ref 8–23)
CO2: 23 mmol/L (ref 22–32)
Calcium: 9.1 mg/dL (ref 8.9–10.3)
Chloride: 106 mmol/L (ref 98–111)
Creatinine: 0.8 mg/dL (ref 0.44–1.00)
GFR, Estimated: >60 mL/min (ref >60)
Glucose, Bld: 134 mg/dL — ABNORMAL HIGH (ref 70–99)
Potassium: 3.9 mmol/L (ref 3.5–5.1)
Sodium: 141 mmol/L (ref 135–145)
Total Bilirubin: 0.3 mg/dL (ref 0.0–1.2)
Total Protein: 6.4 g/dL — ABNORMAL LOW (ref 6.5–8.1)

## 2024-05-20 LAB — TSH: TSH: 1.162 u[IU]/mL (ref 0.350–4.500)

## 2024-05-20 MED ORDER — SODIUM CHLORIDE 0.9 % IV SOLN
480.0000 mg | Freq: Once | INTRAVENOUS | Status: AC
  Administered 2024-05-20: 480 mg via INTRAVENOUS
  Filled 2024-05-20: qty 48

## 2024-05-20 MED ORDER — SODIUM CHLORIDE 0.9% FLUSH
10.0000 mL | INTRAVENOUS | Status: DC | PRN
  Administered 2024-05-20: 10 mL

## 2024-05-20 MED ORDER — SODIUM CHLORIDE 0.9 % IV SOLN: Freq: Once | INTRAVENOUS | Status: AC

## 2024-05-20 MED ORDER — HEPARIN SOD (PORK) LOCK FLUSH 100 UNIT/ML IV SOLN
500.0000 [IU] | Freq: Once | INTRAVENOUS | Status: AC | PRN
  Administered 2024-05-20: 500 [IU]

## 2024-05-20 NOTE — Patient Instructions (Signed)
 Nivolumab Injection What is this medication? NIVOLUMAB (nye VOL ue mab) treats some types of cancer. It works by helping your immune system slow or stop the spread of cancer cells. It is a monoclonal antibody. This medicine may be used for other purposes; ask your health care provider or pharmacist if you have questions. COMMON BRAND NAME(S): Opdivo What should I tell my care team before I take this medication? They need to know if you have any of these conditions: Allogeneic stem cell transplant (uses someone else's stem cells) Autoimmune diseases, such as Crohn disease, ulcerative colitis, lupus History of chest radiation Nervous system problems, such as Guillain-Barre syndrome or myasthenia gravis Organ transplant An unusual or allergic reaction to nivolumab, other medications, foods, dyes, or preservatives Pregnant or trying to get pregnant Breast-feeding How should I use this medication? This medication is infused into a vein. It is given in a hospital or clinic setting. A special MedGuide will be given to you before each treatment. Be sure to read this information carefully each time. Talk to your care team about the use of this medication in children. While it may be prescribed for children as young as 12 years for selected conditions, precautions do apply. Overdosage: If you think you have taken too much of this medicine contact a poison control center or emergency room at once. NOTE: This medicine is only for you. Do not share this medicine with others. What if I miss a dose? Keep appointments for follow-up doses. It is important not to miss your dose. Call your care team if you are unable to keep an appointment. What may interact with this medication? Interactions have not been studied. This list may not describe all possible interactions. Give your health care provider a list of all the medicines, herbs, non-prescription drugs, or dietary supplements you use. Also tell them if you  smoke, drink alcohol, or use illegal drugs. Some items may interact with your medicine. What should I watch for while using this medication? Your condition will be monitored carefully while you are receiving this medication. You may need blood work while taking this medication. This medication may cause serious skin reactions. They can happen weeks to months after starting the medication. Contact your care team right away if you notice fevers or flu-like symptoms with a rash. The rash may be red or purple and then turn into blisters or peeling of the skin. You may also notice a red rash with swelling of the face, lips, or lymph nodes in your neck or under your arms. Tell your care team right away if you have any change in your eyesight. Talk to your care team if you are pregnant or think you might be pregnant. A negative pregnancy test is required before starting this medication. A reliable form of contraception is recommended while taking this medication and for 5 months after the last dose. Talk to your care team about effective forms of contraception. Do not breast-feed while taking this medication and for 5 months after the last dose. What side effects may I notice from receiving this medication? Side effects that you should report to your care team as soon as possible: Allergic reactions--skin rash, itching, hives, swelling of the face, lips, tongue, or throat Dry cough, shortness of breath or trouble breathing Eye pain, redness, irritation, or discharge with blurry or decreased vision Heart muscle inflammation--unusual weakness or fatigue, shortness of breath, chest pain, fast or irregular heartbeat, dizziness, swelling of the ankles, feet, or hands Hormone  gland problems--headache, sensitivity to light, unusual weakness or fatigue, dizziness, fast or irregular heartbeat, increased sensitivity to cold or heat, excessive sweating, constipation, hair loss, increased thirst or amount of urine,  tremors or shaking, irritability Infusion reactions--chest pain, shortness of breath or trouble breathing, feeling faint or lightheaded Kidney injury (glomerulonephritis)--decrease in the amount of urine, red or dark brown urine, foamy or bubbly urine, swelling of the ankles, hands, or feet Liver injury--right upper belly pain, loss of appetite, nausea, light-colored stool, dark yellow or brown urine, yellowing skin or eyes, unusual weakness or fatigue Pain, tingling, or numbness in the hands or feet, muscle weakness, change in vision, confusion or trouble speaking, loss of balance or coordination, trouble walking, seizures Rash, fever, and swollen lymph nodes Redness, blistering, peeling, or loosening of the skin, including inside the mouth Sudden or severe stomach pain, bloody diarrhea, fever, nausea, vomiting Side effects that usually do not require medical attention (report these to your care team if they continue or are bothersome): Bone, joint, or muscle pain Diarrhea Fatigue Loss of appetite Nausea Skin rash This list may not describe all possible side effects. Call your doctor for medical advice about side effects. You may report side effects to FDA at 1-800-FDA-1088. Where should I keep my medication? This medication is given in a hospital or clinic. It will not be stored at home. NOTE: This sheet is a summary. It may not cover all possible information. If you have questions about this medicine, talk to your doctor, pharmacist, or health care provider.  2024 Elsevier/Gold Standard (2022-04-08 00:00:00)

## 2024-05-21 ENCOUNTER — Other Ambulatory Visit: Payer: Self-pay

## 2024-05-21 LAB — T4: T4, Total: 5.1 ug/dL (ref 4.5–12.0)

## 2024-05-28 ENCOUNTER — Other Ambulatory Visit: Payer: Self-pay

## 2024-06-16 NOTE — Progress Notes (Unsigned)
 [facility]  [address] [place],  [place]  [address] [phone]  Clinic Day:  06/17/2024  Referring physician: Fernand Tracey LABOR, MD  HISTORY OF PRESENT ILLNESS:  The patient is a 73 y.o. female with metastatic renal cell carcinoma, proven per cytology from a right-sided thoracentesis in early December 2021.  She comes in today to be evaluated before heading into her 41st cycle of maintenance nivolumab . The patient tolerated her last cycle well without having any significant difficulties.  She denies having any worsening cough, shortness of breath, or other systemic symptoms which concern her for overt signs of disease progression.  This patient's maintenance nivolumab  was preceded by 3 cycles of nivolumab /ipilimumab .  A 4th cycle was not given due to her being hospitalized for severe colitis related to the ipilimumab  component of her combination immunotherapy.     PHYSICAL EXAM:  Blood pressure 114/87, pulse 71, temperature 97.8 F (36.6 C), temperature source Oral, resp. rate 16, height 5' 1 (1.549 m), weight 140 lb (63.5 kg), SpO2 99%. Wt Readings from Last 3 Encounters:  06/17/24 140 lb (63.5 kg)  05/20/24 141 lb (64 kg)  05/20/24 141 lb (64 kg)   Body mass index is 26.45 kg/m. Performance status (ECOG): 1 - Symptomatic but completely ambulatory Physical Exam Constitutional:      Appearance: Normal appearance. She is not ill-appearing.  HENT:     Mouth/Throat:     Mouth: Mucous membranes are moist.     Pharynx: Oropharynx is clear. No oropharyngeal exudate or posterior oropharyngeal erythema.   Cardiovascular:     Rate and Rhythm: Normal rate and regular rhythm.     Heart sounds: No murmur heard.    No friction rub. No gallop.  Pulmonary:     Effort: Pulmonary effort is normal. No respiratory distress.     Breath sounds: Examination of the right-lower field reveals decreased breath sounds. Decreased breath sounds present. No wheezing,  rhonchi or rales.  Abdominal:     General: Bowel sounds are normal. There is no distension.     Palpations: Abdomen is soft. There is no mass.     Tenderness: There is no abdominal tenderness.   Musculoskeletal:        General: No swelling.     Right lower leg: No edema.     Left lower leg: No edema.  Lymphadenopathy:     Cervical: No cervical adenopathy.     Upper Body:     Right upper body: No supraclavicular or axillary adenopathy.     Left upper body: No supraclavicular or axillary adenopathy.     Lower Body: No right inguinal adenopathy. No left inguinal adenopathy.   Skin:    General: Skin is warm.     Coloration: Skin is not jaundiced.     Findings: No lesion or rash.   Neurological:     General: No focal deficit present.     Mental Status: She is alert and oriented to person, place, and time. Mental status is at baseline.   Psychiatric:        Mood and Affect: Mood normal.        Behavior: Behavior normal.        Thought Content: Thought content normal.    LABS:      Latest Ref Rng & Units 06/17/2024   10:15 AM 05/20/2024   10:21 AM 04/22/2024   10:18 AM  CBC  WBC 4.0 - 10.5 K/uL 6.8  5.5  6.2   Hemoglobin 12.0 - 15.0 g/dL 86.7  87.1  86.7   Hematocrit 36.0 - 46.0 % 39.9  38.9  38.8   Platelets 150 - 400 K/uL 194  178  188       Latest Ref Rng & Units 06/17/2024   10:15 AM 05/20/2024   10:21 AM 04/22/2024   10:18 AM  CMP  Glucose 70 - 99 mg/dL 90  865  91   BUN 8 - 23 mg/dL 13  13  10    Creatinine 0.44 - 1.00 mg/dL 9.27  9.19  9.24   Sodium 135 - 145 mmol/L 142  141  141   Potassium 3.5 - 5.1 mmol/L 4.3  3.9  4.3   Chloride 98 - 111 mmol/L 106  106  104   CO2 22 - 32 mmol/L 24  23  25    Calcium 8.9 - 10.3 mg/dL 8.7  9.1  9.3   Total Protein 6.5 - 8.1 g/dL 6.7  6.4  6.7   Total Bilirubin 0.0 - 1.2 mg/dL 0.3  0.3  0.4   Alkaline Phos 38 - 126 U/L 75  71  70   AST 15 - 41 U/L 33  28  35   ALT 0 - 44 U/L 26  22  24     ASSESSMENT & PLAN:  Assessment/Plan:   A 73 y.o. female with metastatic renal cell carcinoma.  She will proceed with her 41st cycle of maintenance nivolumab  today.  Clinically, the patient is doing well.  I will see her back in 4 weeks before she heads into her 42nd cycle of maintenance nivolumab .  CT scans will be done a day before her next visit to ascertain her new disease baseline.  The patient understands all the plans discussed today and is in agreement with them.    [name] [name] [name] Kerns, MD

## 2024-06-17 ENCOUNTER — Inpatient Hospital Stay: Attending: Oncology | Admitting: Oncology

## 2024-06-17 ENCOUNTER — Inpatient Hospital Stay

## 2024-06-17 ENCOUNTER — Other Ambulatory Visit: Payer: Self-pay

## 2024-06-17 ENCOUNTER — Other Ambulatory Visit: Payer: Self-pay | Admitting: Oncology

## 2024-06-17 VITALS — BP 114/87 | HR 71 | Temp 97.8°F | Resp 16 | Ht 61.0 in | Wt 140.0 lb

## 2024-06-17 DIAGNOSIS — Z5112 Encounter for antineoplastic immunotherapy: Secondary | ICD-10-CM | POA: Diagnosis not present

## 2024-06-17 DIAGNOSIS — D3001 Benign neoplasm of right kidney: Secondary | ICD-10-CM

## 2024-06-17 DIAGNOSIS — C641 Malignant neoplasm of right kidney, except renal pelvis: Secondary | ICD-10-CM | POA: Diagnosis not present

## 2024-06-17 DIAGNOSIS — Z79620 Long term (current) use of immunosuppressive biologic: Secondary | ICD-10-CM | POA: Insufficient documentation

## 2024-06-17 LAB — CBC WITH DIFFERENTIAL (CANCER CENTER ONLY)
Abs Immature Granulocytes: 0.02 10*3/uL (ref 0.00–0.07)
Basophils Absolute: 0.1 10*3/uL (ref 0.0–0.1)
Basophils Relative: 1 %
Eosinophils Absolute: 0.4 10*3/uL (ref 0.0–0.5)
Eosinophils Relative: 6 %
HCT: 39.9 % (ref 36.0–46.0)
Hemoglobin: 13.2 g/dL (ref 12.0–15.0)
Immature Granulocytes: 0 %
Lymphocytes Relative: 20 %
Lymphs Abs: 1.3 10*3/uL (ref 0.7–4.0)
MCH: 32.7 pg (ref 26.0–34.0)
MCHC: 33.1 g/dL (ref 30.0–36.0)
MCV: 98.8 fL (ref 80.0–100.0)
Monocytes Absolute: 0.6 10*3/uL (ref 0.1–1.0)
Monocytes Relative: 9 %
Neutro Abs: 4.4 10*3/uL (ref 1.7–7.7)
Neutrophils Relative %: 64 %
Platelet Count: 194 10*3/uL (ref 150–400)
RBC: 4.04 MIL/uL (ref 3.87–5.11)
RDW: 13.4 % (ref 11.5–15.5)
WBC Count: 6.8 10*3/uL (ref 4.0–10.5)
nRBC: 0 % (ref 0.0–0.2)

## 2024-06-17 LAB — CMP (CANCER CENTER ONLY)
ALT: 26 U/L (ref 0–44)
AST: 33 U/L (ref 15–41)
Albumin: 4.6 g/dL (ref 3.5–5.0)
Alkaline Phosphatase: 75 U/L (ref 38–126)
Anion gap: 12 (ref 5–15)
BUN: 13 mg/dL (ref 8–23)
CO2: 24 mmol/L (ref 22–32)
Calcium: 8.7 mg/dL — ABNORMAL LOW (ref 8.9–10.3)
Chloride: 106 mmol/L (ref 98–111)
Creatinine: 0.72 mg/dL (ref 0.44–1.00)
GFR, Estimated: >60 mL/min (ref >60)
Glucose, Bld: 90 mg/dL (ref 70–99)
Potassium: 4.3 mmol/L (ref 3.5–5.1)
Sodium: 142 mmol/L (ref 135–145)
Total Bilirubin: 0.3 mg/dL (ref 0.0–1.2)
Total Protein: 6.7 g/dL (ref 6.5–8.1)

## 2024-06-17 LAB — TSH: TSH: 1.149 u[IU]/mL (ref 0.350–4.500)

## 2024-06-17 MED ORDER — HEPARIN SOD (PORK) LOCK FLUSH 100 UNIT/ML IV SOLN
500.0000 [IU] | Freq: Once | INTRAVENOUS | Status: DC | PRN
Start: 2024-06-17 — End: 2024-06-17

## 2024-06-17 MED ORDER — SODIUM CHLORIDE 0.9 % IV SOLN
480.0000 mg | Freq: Once | INTRAVENOUS | Status: AC
  Administered 2024-06-17: 480 mg via INTRAVENOUS
  Filled 2024-06-17: qty 48

## 2024-06-17 MED ORDER — SODIUM CHLORIDE 0.9% FLUSH: 10.0000 mL | INTRAVENOUS | Status: DC | PRN

## 2024-06-17 MED ORDER — SODIUM CHLORIDE 0.9 % IV SOLN: Freq: Once | INTRAVENOUS | Status: AC

## 2024-06-17 NOTE — Patient Instructions (Signed)
 Nivolumab Injection What is this medication? NIVOLUMAB (nye VOL ue mab) treats some types of cancer. It works by helping your immune system slow or stop the spread of cancer cells. It is a monoclonal antibody. This medicine may be used for other purposes; ask your health care provider or pharmacist if you have questions. COMMON BRAND NAME(S): Opdivo What should I tell my care team before I take this medication? They need to know if you have any of these conditions: Allogeneic stem cell transplant (uses someone else's stem cells) Autoimmune diseases, such as Crohn disease, ulcerative colitis, lupus History of chest radiation Nervous system problems, such as Guillain-Barre syndrome or myasthenia gravis Organ transplant An unusual or allergic reaction to nivolumab, other medications, foods, dyes, or preservatives Pregnant or trying to get pregnant Breast-feeding How should I use this medication? This medication is infused into a vein. It is given in a hospital or clinic setting. A special MedGuide will be given to you before each treatment. Be sure to read this information carefully each time. Talk to your care team about the use of this medication in children. While it may be prescribed for children as young as 12 years for selected conditions, precautions do apply. Overdosage: If you think you have taken too much of this medicine contact a poison control center or emergency room at once. NOTE: This medicine is only for you. Do not share this medicine with others. What if I miss a dose? Keep appointments for follow-up doses. It is important not to miss your dose. Call your care team if you are unable to keep an appointment. What may interact with this medication? Interactions have not been studied. This list may not describe all possible interactions. Give your health care provider a list of all the medicines, herbs, non-prescription drugs, or dietary supplements you use. Also tell them if you  smoke, drink alcohol, or use illegal drugs. Some items may interact with your medicine. What should I watch for while using this medication? Your condition will be monitored carefully while you are receiving this medication. You may need blood work while taking this medication. This medication may cause serious skin reactions. They can happen weeks to months after starting the medication. Contact your care team right away if you notice fevers or flu-like symptoms with a rash. The rash may be red or purple and then turn into blisters or peeling of the skin. You may also notice a red rash with swelling of the face, lips, or lymph nodes in your neck or under your arms. Tell your care team right away if you have any change in your eyesight. Talk to your care team if you are pregnant or think you might be pregnant. A negative pregnancy test is required before starting this medication. A reliable form of contraception is recommended while taking this medication and for 5 months after the last dose. Talk to your care team about effective forms of contraception. Do not breast-feed while taking this medication and for 5 months after the last dose. What side effects may I notice from receiving this medication? Side effects that you should report to your care team as soon as possible: Allergic reactions--skin rash, itching, hives, swelling of the face, lips, tongue, or throat Dry cough, shortness of breath or trouble breathing Eye pain, redness, irritation, or discharge with blurry or decreased vision Heart muscle inflammation--unusual weakness or fatigue, shortness of breath, chest pain, fast or irregular heartbeat, dizziness, swelling of the ankles, feet, or hands Hormone  gland problems--headache, sensitivity to light, unusual weakness or fatigue, dizziness, fast or irregular heartbeat, increased sensitivity to cold or heat, excessive sweating, constipation, hair loss, increased thirst or amount of urine,  tremors or shaking, irritability Infusion reactions--chest pain, shortness of breath or trouble breathing, feeling faint or lightheaded Kidney injury (glomerulonephritis)--decrease in the amount of urine, red or dark brown urine, foamy or bubbly urine, swelling of the ankles, hands, or feet Liver injury--right upper belly pain, loss of appetite, nausea, light-colored stool, dark yellow or brown urine, yellowing skin or eyes, unusual weakness or fatigue Pain, tingling, or numbness in the hands or feet, muscle weakness, change in vision, confusion or trouble speaking, loss of balance or coordination, trouble walking, seizures Rash, fever, and swollen lymph nodes Redness, blistering, peeling, or loosening of the skin, including inside the mouth Sudden or severe stomach pain, bloody diarrhea, fever, nausea, vomiting Side effects that usually do not require medical attention (report these to your care team if they continue or are bothersome): Bone, joint, or muscle pain Diarrhea Fatigue Loss of appetite Nausea Skin rash This list may not describe all possible side effects. Call your doctor for medical advice about side effects. You may report side effects to FDA at 1-800-FDA-1088. Where should I keep my medication? This medication is given in a hospital or clinic. It will not be stored at home. NOTE: This sheet is a summary. It may not cover all possible information. If you have questions about this medicine, talk to your doctor, pharmacist, or health care provider.  2024 Elsevier/Gold Standard (2022-04-08 00:00:00)

## 2024-06-18 LAB — T4: T4, Total: 5 ug/dL (ref 4.5–12.0)

## 2024-07-08 ENCOUNTER — Inpatient Hospital Stay (HOSPITAL_BASED_OUTPATIENT_CLINIC_OR_DEPARTMENT_OTHER)
Admission: RE | Admit: 2024-07-08 | Discharge: 2024-07-08 | Disposition: A | Discharge status: Home / Self Care | Source: Ambulatory Visit | Attending: Oncology | Admitting: Oncology

## 2024-07-08 DIAGNOSIS — C641 Malignant neoplasm of right kidney, except renal pelvis: Secondary | ICD-10-CM

## 2024-07-08 DIAGNOSIS — R222 Localized swelling, mass and lump, trunk: Secondary | ICD-10-CM | POA: Diagnosis not present

## 2024-07-08 DIAGNOSIS — N2889 Other specified disorders of kidney and ureter: Secondary | ICD-10-CM | POA: Diagnosis not present

## 2024-07-08 MED ORDER — HEPARIN SOD (PORK) LOCK FLUSH 100 UNIT/ML IV SOLN
500.0000 [IU] | Freq: Once | INTRAVENOUS | Status: AC
  Administered 2024-07-08: 500 [IU] via INTRAVENOUS

## 2024-07-08 MED ORDER — IOHEXOL 300 MG/ML  SOLN
100.0000 mL | Freq: Once | INTRAMUSCULAR | Status: AC | PRN
  Administered 2024-07-08: 100 mL via INTRAVENOUS

## 2024-07-10 ENCOUNTER — Other Ambulatory Visit: Payer: Self-pay

## 2024-07-14 ENCOUNTER — Inpatient Hospital Stay: Attending: Oncology

## 2024-07-14 ENCOUNTER — Encounter: Payer: Self-pay | Admitting: Hematology and Oncology

## 2024-07-14 ENCOUNTER — Inpatient Hospital Stay (HOSPITAL_BASED_OUTPATIENT_CLINIC_OR_DEPARTMENT_OTHER): Admitting: Hematology and Oncology

## 2024-07-14 VITALS — BP 141/82 | HR 66 | Temp 98.1°F | Resp 20 | Ht 61.0 in | Wt 141.2 lb

## 2024-07-14 DIAGNOSIS — C641 Malignant neoplasm of right kidney, except renal pelvis: Secondary | ICD-10-CM | POA: Insufficient documentation

## 2024-07-14 DIAGNOSIS — E860 Dehydration: Secondary | ICD-10-CM

## 2024-07-14 DIAGNOSIS — Z79620 Long term (current) use of immunosuppressive biologic: Secondary | ICD-10-CM | POA: Diagnosis not present

## 2024-07-14 DIAGNOSIS — Z5112 Encounter for antineoplastic immunotherapy: Secondary | ICD-10-CM | POA: Diagnosis not present

## 2024-07-14 DIAGNOSIS — D3001 Benign neoplasm of right kidney: Secondary | ICD-10-CM

## 2024-07-14 LAB — CMP (CANCER CENTER ONLY)
ALT: 23 U/L (ref 0–44)
AST: 26 U/L (ref 15–41)
Albumin: 4.3 g/dL (ref 3.5–5.0)
Alkaline Phosphatase: 75 U/L (ref 38–126)
Anion gap: 12 (ref 5–15)
BUN: 14 mg/dL (ref 8–23)
CO2: 23 mmol/L (ref 22–32)
Calcium: 8.9 mg/dL (ref 8.9–10.3)
Chloride: 104 mmol/L (ref 98–111)
Creatinine: 0.72 mg/dL (ref 0.44–1.00)
GFR, Estimated: >60 mL/min (ref >60)
Glucose, Bld: 106 mg/dL — ABNORMAL HIGH (ref 70–99)
Potassium: 4 mmol/L (ref 3.5–5.1)
Sodium: 140 mmol/L (ref 135–145)
Total Bilirubin: 0.3 mg/dL (ref 0.0–1.2)
Total Protein: 6.8 g/dL (ref 6.5–8.1)

## 2024-07-14 LAB — CBC WITH DIFFERENTIAL (CANCER CENTER ONLY)
Abs Immature Granulocytes: 0.02 K/uL (ref 0.00–0.07)
Basophils Absolute: 0.1 K/uL (ref 0.0–0.1)
Basophils Relative: 1 %
Eosinophils Absolute: 0.3 K/uL (ref 0.0–0.5)
Eosinophils Relative: 4 %
HCT: 40 % (ref 36.0–46.0)
Hemoglobin: 13.3 g/dL (ref 12.0–15.0)
Immature Granulocytes: 0 %
Lymphocytes Relative: 19 %
Lymphs Abs: 1.4 K/uL (ref 0.7–4.0)
MCH: 32.8 pg (ref 26.0–34.0)
MCHC: 33.3 g/dL (ref 30.0–36.0)
MCV: 98.8 fL (ref 80.0–100.0)
Monocytes Absolute: 0.5 K/uL (ref 0.1–1.0)
Monocytes Relative: 7 %
Neutro Abs: 5.4 K/uL (ref 1.7–7.7)
Neutrophils Relative %: 69 %
Platelet Count: 214 K/uL (ref 150–400)
RBC: 4.05 MIL/uL (ref 3.87–5.11)
RDW: 13.9 % (ref 11.5–15.5)
WBC Count: 7.7 K/uL (ref 4.0–10.5)
nRBC: 0 % (ref 0.0–0.2)

## 2024-07-14 LAB — TSH: TSH: 2.039 u[IU]/mL (ref 0.350–4.500)

## 2024-07-14 MED ORDER — HEPARIN SOD (PORK) LOCK FLUSH 100 UNIT/ML IV SOLN
500.0000 [IU] | Freq: Once | INTRAVENOUS | Status: AC | PRN
  Administered 2024-07-14: 500 [IU]

## 2024-07-14 MED ORDER — SODIUM CHLORIDE 0.9% FLUSH
10.0000 mL | INTRAVENOUS | Status: DC | PRN
Start: 2024-07-14 — End: 2024-07-15
  Administered 2024-07-14: 10 mL

## 2024-07-14 NOTE — Progress Notes (Cosign Needed)
 [facility]  [address] [place],  [place]  [address] [phone]  Clinic Day:  07/14/2024  Referring physician: Fernand Tracey LABOR, MD   HISTORY OF PRESENT ILLNESS:  The patient is a 73 y.o. female with metastatic renal cell carcinoma, proven per cytology from a right-sided thoracentesis in early December 2021.  She comes in today to review recent CT imaging before heading into her 42nd cycle of maintenance nivolumab . The patient states she tolerated her last cycle without significant difficulties.  She denies having any worsening cough, shortness of breath, or other systemic symptoms which concern her for overt signs of disease progression.  Her husband remains at [facility] after a fall and fracture of his leg.  Of note, this patient's maintenance nivolumab  was preceded by 3 cycles of nivolumab /ipilimumab .  A 4th cycle was not given due to her being hospitalized for severe colitis related to the ipilimumab  component of her combination immunotherapy.      VITALS:   Blood pressure (!) 141/82, pulse 66, temperature 98.1 F (36.7 C), temperature source Oral, resp. rate 20, height 5' 1 (1.549 m), weight 141 lb 3.2 oz (64 kg), SpO2 99%. Wt Readings from Last 3 Encounters:  07/14/24 141 lb 3.2 oz (64 kg)  06/17/24 140 lb (63.5 kg)  05/20/24 141 lb (64 kg)   Body mass index is 26.68 kg/m.  Performance status (ECOG): 0 - Asymptomatic  PHYSICAL EXAM:   Physical Exam Vitals and nursing note reviewed.  Constitutional:      General: She is not in acute distress.    Appearance: Normal appearance.  HENT:     Head: Normocephalic and atraumatic.     Mouth/Throat:     Mouth: Mucous membranes are moist.     Pharynx: Oropharynx is clear. No oropharyngeal exudate or posterior oropharyngeal erythema.  Eyes:     General: No scleral icterus.    Extraocular Movements: Extraocular movements intact.     Conjunctiva/sclera: Conjunctivae normal.     Pupils: Pupils are equal, round,  and reactive to light.  Cardiovascular:     Rate and Rhythm: Normal rate and regular rhythm.     Heart sounds: Murmur heard.     Systolic murmur is present with a grade of 3/6.     No friction rub. No gallop.  Pulmonary:     Effort: Pulmonary effort is normal.     Breath sounds: Examination of the right-lower field reveals decreased breath sounds. Decreased breath sounds present. No wheezing, rhonchi or rales.  Abdominal:     General: There is no distension.     Palpations: Abdomen is soft. There is no hepatomegaly, splenomegaly or mass.     Tenderness: There is no abdominal tenderness.  Musculoskeletal:        General: Normal range of motion.     Cervical back: Normal range of motion and neck supple. No tenderness.     Right lower leg: No edema.     Left lower leg: No edema.  Lymphadenopathy:     Cervical: No cervical adenopathy.     Upper Body:     Right upper body: No supraclavicular or axillary adenopathy.     Left upper body: No supraclavicular or axillary adenopathy.     Lower Body: No right inguinal adenopathy. No left inguinal adenopathy.  Skin:    General: Skin is warm and dry.     Coloration: Skin is not jaundiced.     Findings: No rash.  Neurological:  Mental Status: She is alert and oriented to person, place, and time.     Cranial Nerves: No cranial nerve deficit.  Psychiatric:        Mood and Affect: Mood normal.        Behavior: Behavior normal.        Thought Content: Thought content normal.      LABS:      Latest Ref Rng & Units 07/14/2024   11:03 AM 06/17/2024   10:15 AM 05/20/2024   10:21 AM  CBC  WBC 4.0 - 10.5 K/uL 7.7  6.8  5.5   Hemoglobin 12.0 - 15.0 g/dL 86.6  86.7  87.1   Hematocrit 36.0 - 46.0 % 40.0  39.9  38.9   Platelets 150 - 400 K/uL 214  194  178       Latest Ref Rng & Units 07/14/2024   11:03 AM 06/17/2024   10:15 AM 05/20/2024   10:21 AM  CMP  Glucose 70 - 99 mg/dL 893  90  865   BUN 8 - 23 mg/dL 14  13  13    Creatinine 0.44 -  1.00 mg/dL 9.27  9.27  9.19   Sodium 135 - 145 mmol/L 140  142  141   Potassium 3.5 - 5.1 mmol/L 4.0  4.3  3.9   Chloride 98 - 111 mmol/L 104  106  106   CO2 22 - 32 mmol/L 23  24  23    Calcium 8.9 - 10.3 mg/dL 8.9  8.7  9.1   Total Protein 6.5 - 8.1 g/dL 6.8  6.7  6.4   Total Bilirubin 0.0 - 1.2 mg/dL 0.3  0.3  0.3   Alkaline Phos 38 - 126 U/L 75  75  71   AST 15 - 41 U/L 26  33  28   ALT 0 - 44 U/L 23  26  22      Lab Results  Component Value Date   LDH 180 11/28/2020       Component Value Date/Time   LDH 180 11/28/2020 0955    Review Flowsheet       Latest Ref Rng & Units 11/28/2020  Oncology Labs  LDH 120 - 250 U/L 180      STUDIES:   CT CHEST ABDOMEN PELVIS W CONTRAST Result Date: 07/08/2024 CLINICAL DATA:  Metastatic right renal cell carcinoma restaging * Tracking Code: BO * EXAM: CT CHEST, ABDOMEN, AND PELVIS WITH CONTRAST TECHNIQUE: Multidetector CT imaging of the chest, abdomen and pelvis was performed following the standard protocol during bolus administration of intravenous contrast. RADIATION DOSE REDUCTION: This exam was performed according to the departmental dose-optimization program which includes automated exposure control, adjustment of the mA and/or kV according to patient size and/or use of iterative reconstruction technique. CONTRAST:  OMNIPAQUE  IOHEXOL  300 MG/ML  SOLN COMPARISON:  01/22/2024 FINDINGS: CT CHEST FINDINGS Cardiovascular: Right Port-A-Cath tip: Cavoatrial junction. Coronary, aortic arch, and branch vessel atherosclerotic vascular disease. Mediastinum/Nodes: No pathologic adenopathy. Lungs/Pleura: Stable large right pleural effusion. Trace left pleural effusion. Unchanged mild nodularity along the left major fissure. Wedge resection clips medially in the right upper lobe. Bandlike nodularity in the right middle lobe similar to prior. Musculoskeletal: Partial resection right third rib. Substantial thoracic spondylosis. CT ABDOMEN PELVIS FINDINGS  Hepatobiliary: Multiple lobulations of the hepatic parenchyma along the posterior dome of the right hepatic lobe not substantially changed from previous exams. Gallbladder unremarkable. Small vascular malformation anteriorly in the lateral segment left hepatic lobe, image 52  series 301. Pancreas: Unremarkable Spleen: Unremarkable Adrenals/Urinary Tract: The enhancing right kidney lower pole mass measures 4.3 by 2.9 by 3.6 cm. By my measurement this was previously same. Appearance compatible with renal cell carcinoma. Adrenal glands unremarkable. Stomach/Bowel: Prominent stool throughout the colon favors constipation. Mild sigmoid colon diverticulosis. Vascular/Lymphatic: Atherosclerosis is present, including aortoiliac atherosclerotic disease. Atheromatous plaque proximally in the celiac trunk. No tumor thrombus in the right renal vein. No current pathologic adenopathy observed. Reproductive: Uterus absent. Other: No supplemental non-categorized findings. Musculoskeletal: Dextroconvex lumbar scoliosis with rotary component. 7 mm of degenerative anterolisthesis at L4-5. Lower lumbar spondylosis and degenerative disc disease with foraminal impingement on the right at L4-5 and L5-S1, and on the left at L4-5. IMPRESSION: 1. Stable size (compared to 01/22/2024) of the enhancing right kidney lower pole mass compatible with renal cell carcinoma. 2. Stable large right pleural effusion. Trace left pleural effusion. 3. Prominent stool throughout the colon favors constipation. 4. Lower lumbar foraminal impingement with lumbar spondylosis and scoliosis. 5. Aortic Atherosclerosis (ICD10-I70.0). Coronary and systemic atherosclerosis. Electronically Signed   By: Ryan Salvage M.D.   On: 07/08/2024 13:53      ASSESSMENT & PLAN:   Assessment/Plan:  73 y.o. female with metastatic renal cell carcinoma. She is understandably pleased that recent CT imaging reveals stable disease, without evidence of new metastasis.   Clinically, she is doing well.  She will proceed with her 42nd cycle of maintenance nivolumab  tomorrow.  I will see her back in 4 weeks before she heads into her 43rd cycle of maintenance nivolumab .   The patient understands all the plans discussed today and is in agreement with them.  She knows to contact our office if she develops concerns prior to her next appointment.     Andrez DELENA Foy, PA-C   Physician Assistant [facility]  [phone]

## 2024-07-15 ENCOUNTER — Inpatient Hospital Stay

## 2024-07-15 ENCOUNTER — Other Ambulatory Visit

## 2024-07-15 ENCOUNTER — Other Ambulatory Visit: Payer: Self-pay

## 2024-07-15 ENCOUNTER — Ambulatory Visit: Admitting: Hematology and Oncology

## 2024-07-15 ENCOUNTER — Ambulatory Visit

## 2024-07-15 VITALS — BP 160/69 | HR 68 | Temp 98.7°F | Resp 20 | Ht 61.0 in | Wt 140.0 lb

## 2024-07-15 DIAGNOSIS — Z79620 Long term (current) use of immunosuppressive biologic: Secondary | ICD-10-CM | POA: Diagnosis not present

## 2024-07-15 DIAGNOSIS — C641 Malignant neoplasm of right kidney, except renal pelvis: Secondary | ICD-10-CM | POA: Diagnosis not present

## 2024-07-15 DIAGNOSIS — Z5112 Encounter for antineoplastic immunotherapy: Secondary | ICD-10-CM | POA: Diagnosis not present

## 2024-07-15 DIAGNOSIS — D3001 Benign neoplasm of right kidney: Secondary | ICD-10-CM

## 2024-07-15 LAB — T4: T4, Total: 5.1 ug/dL (ref 4.5–12.0)

## 2024-07-15 MED ORDER — SODIUM CHLORIDE 0.9 % IV SOLN: Freq: Once | INTRAVENOUS | Status: AC

## 2024-07-15 MED ORDER — SODIUM CHLORIDE 0.9% FLUSH
10.0000 mL | INTRAVENOUS | Status: DC | PRN
  Administered 2024-07-15: 10 mL

## 2024-07-15 MED ORDER — SODIUM CHLORIDE 0.9 % IV SOLN
480.0000 mg | Freq: Once | INTRAVENOUS | Status: AC
  Administered 2024-07-15: 480 mg via INTRAVENOUS
  Filled 2024-07-15: qty 48

## 2024-07-15 MED ORDER — HEPARIN SOD (PORK) LOCK FLUSH 100 UNIT/ML IV SOLN
500.0000 [IU] | Freq: Once | INTRAVENOUS | Status: AC | PRN
  Administered 2024-07-15: 500 [IU]

## 2024-07-15 NOTE — Patient Instructions (Signed)
 Nivolumab Injection What is this medication? NIVOLUMAB (nye VOL ue mab) treats some types of cancer. It works by helping your immune system slow or stop the spread of cancer cells. It is a monoclonal antibody. This medicine may be used for other purposes; ask your health care provider or pharmacist if you have questions. COMMON BRAND NAME(S): Opdivo What should I tell my care team before I take this medication? They need to know if you have any of these conditions: Allogeneic stem cell transplant (uses someone else's stem cells) Autoimmune diseases, such as Crohn disease, ulcerative colitis, lupus History of chest radiation Nervous system problems, such as Guillain-Barre syndrome or myasthenia gravis Organ transplant An unusual or allergic reaction to nivolumab, other medications, foods, dyes, or preservatives Pregnant or trying to get pregnant Breast-feeding How should I use this medication? This medication is infused into a vein. It is given in a hospital or clinic setting. A special MedGuide will be given to you before each treatment. Be sure to read this information carefully each time. Talk to your care team about the use of this medication in children. While it may be prescribed for children as young as 12 years for selected conditions, precautions do apply. Overdosage: If you think you have taken too much of this medicine contact a poison control center or emergency room at once. NOTE: This medicine is only for you. Do not share this medicine with others. What if I miss a dose? Keep appointments for follow-up doses. It is important not to miss your dose. Call your care team if you are unable to keep an appointment. What may interact with this medication? Interactions have not been studied. This list may not describe all possible interactions. Give your health care provider a list of all the medicines, herbs, non-prescription drugs, or dietary supplements you use. Also tell them if you  smoke, drink alcohol, or use illegal drugs. Some items may interact with your medicine. What should I watch for while using this medication? Your condition will be monitored carefully while you are receiving this medication. You may need blood work while taking this medication. This medication may cause serious skin reactions. They can happen weeks to months after starting the medication. Contact your care team right away if you notice fevers or flu-like symptoms with a rash. The rash may be red or purple and then turn into blisters or peeling of the skin. You may also notice a red rash with swelling of the face, lips, or lymph nodes in your neck or under your arms. Tell your care team right away if you have any change in your eyesight. Talk to your care team if you are pregnant or think you might be pregnant. A negative pregnancy test is required before starting this medication. A reliable form of contraception is recommended while taking this medication and for 5 months after the last dose. Talk to your care team about effective forms of contraception. Do not breast-feed while taking this medication and for 5 months after the last dose. What side effects may I notice from receiving this medication? Side effects that you should report to your care team as soon as possible: Allergic reactions--skin rash, itching, hives, swelling of the face, lips, tongue, or throat Dry cough, shortness of breath or trouble breathing Eye pain, redness, irritation, or discharge with blurry or decreased vision Heart muscle inflammation--unusual weakness or fatigue, shortness of breath, chest pain, fast or irregular heartbeat, dizziness, swelling of the ankles, feet, or hands Hormone  gland problems--headache, sensitivity to light, unusual weakness or fatigue, dizziness, fast or irregular heartbeat, increased sensitivity to cold or heat, excessive sweating, constipation, hair loss, increased thirst or amount of urine,  tremors or shaking, irritability Infusion reactions--chest pain, shortness of breath or trouble breathing, feeling faint or lightheaded Kidney injury (glomerulonephritis)--decrease in the amount of urine, red or dark brown urine, foamy or bubbly urine, swelling of the ankles, hands, or feet Liver injury--right upper belly pain, loss of appetite, nausea, light-colored stool, dark yellow or brown urine, yellowing skin or eyes, unusual weakness or fatigue Pain, tingling, or numbness in the hands or feet, muscle weakness, change in vision, confusion or trouble speaking, loss of balance or coordination, trouble walking, seizures Rash, fever, and swollen lymph nodes Redness, blistering, peeling, or loosening of the skin, including inside the mouth Sudden or severe stomach pain, bloody diarrhea, fever, nausea, vomiting Side effects that usually do not require medical attention (report these to your care team if they continue or are bothersome): Bone, joint, or muscle pain Diarrhea Fatigue Loss of appetite Nausea Skin rash This list may not describe all possible side effects. Call your doctor for medical advice about side effects. You may report side effects to FDA at 1-800-FDA-1088. Where should I keep my medication? This medication is given in a hospital or clinic. It will not be stored at home. NOTE: This sheet is a summary. It may not cover all possible information. If you have questions about this medicine, talk to your doctor, pharmacist, or health care provider.  2024 Elsevier/Gold Standard (2022-04-08 00:00:00)

## 2024-07-26 DIAGNOSIS — Z6824 Body mass index (BMI) 24.0-24.9, adult: Secondary | ICD-10-CM | POA: Diagnosis not present

## 2024-07-26 DIAGNOSIS — J329 Chronic sinusitis, unspecified: Secondary | ICD-10-CM | POA: Diagnosis not present

## 2024-08-10 DIAGNOSIS — R519 Headache, unspecified: Secondary | ICD-10-CM | POA: Diagnosis not present

## 2024-08-10 DIAGNOSIS — H8103 Meniere's disease, bilateral: Secondary | ICD-10-CM | POA: Diagnosis not present

## 2024-08-10 DIAGNOSIS — S0990XA Unspecified injury of head, initial encounter: Secondary | ICD-10-CM | POA: Diagnosis not present

## 2024-08-10 DIAGNOSIS — M549 Dorsalgia, unspecified: Secondary | ICD-10-CM | POA: Diagnosis not present

## 2024-08-10 DIAGNOSIS — S199XXA Unspecified injury of neck, initial encounter: Secondary | ICD-10-CM | POA: Diagnosis not present

## 2024-08-10 DIAGNOSIS — W19XXXA Unspecified fall, initial encounter: Secondary | ICD-10-CM | POA: Diagnosis not present

## 2024-08-10 DIAGNOSIS — M545 Low back pain, unspecified: Secondary | ICD-10-CM | POA: Diagnosis not present

## 2024-08-11 NOTE — Progress Notes (Unsigned)
 [facility] at [facility] [address] [place],  [place]  [address] [phone]  Clinic Day:  08/12/2024  Referring physician: Fernand Tracey LABOR, MD  HISTORY OF PRESENT ILLNESS:  The patient is a 73 y.o. female with metastatic renal cell carcinoma, proven per cytology from a right-sided thoracentesis in early December 2021.  She comes in today to be evaluated before heading into her 43rd cycle of maintenance nivolumab . The patient tolerated her last cycle well without having any significant difficulties.  She denies having any worsening cough, shortness of breath, or other systemic symptoms which concern her for overt signs of disease progression.  This patient's maintenance nivolumab  was preceded by 3 cycles of nivolumab /ipilimumab .  A 4th cycle was not given due to her being hospitalized for severe colitis related to the ipilimumab  component of her combination immunotherapy.     PHYSICAL EXAM:  Blood pressure (!) 146/68, pulse 62, temperature 98.4 F (36.9 C), temperature source Oral, resp. rate 14, height 5' 1 (1.549 m), weight 137 lb 3.2 oz (62.2 kg), SpO2 98%. Wt Readings from Last 3 Encounters:  08/12/24 137 lb 3.2 oz (62.2 kg)  07/15/24 140 lb 0.6 oz (63.5 kg)  07/14/24 141 lb 3.2 oz (64 kg)   Body mass index is 25.92 kg/m. Performance status (ECOG): 1 - Symptomatic but completely ambulatory Physical Exam Constitutional:      Appearance: Normal appearance. She is not ill-appearing.  HENT:     Mouth/Throat:     Mouth: Mucous membranes are moist.     Pharynx: Oropharynx is clear. No oropharyngeal exudate or posterior oropharyngeal erythema.  Cardiovascular:     Rate and Rhythm: Normal rate and regular rhythm.     Heart sounds: No murmur heard.    No friction rub. No gallop.  Pulmonary:     Effort: Pulmonary effort is normal. No respiratory distress.     Breath sounds: Examination of the right-lower field reveals decreased breath sounds. Decreased breath  sounds present. No wheezing, rhonchi or rales.  Abdominal:     General: Bowel sounds are normal. There is no distension.     Palpations: Abdomen is soft. There is no mass.     Tenderness: There is no abdominal tenderness.  Musculoskeletal:        General: No swelling.     Right lower leg: No edema.     Left lower leg: No edema.  Lymphadenopathy:     Cervical: No cervical adenopathy.     Upper Body:     Right upper body: No supraclavicular or axillary adenopathy.     Left upper body: No supraclavicular or axillary adenopathy.     Lower Body: No right inguinal adenopathy. No left inguinal adenopathy.  Skin:    General: Skin is warm.     Coloration: Skin is not jaundiced.     Findings: No lesion or rash.  Neurological:     General: No focal deficit present.     Mental Status: She is alert and oriented to person, place, and time. Mental status is at baseline.  Psychiatric:        Mood and Affect: Mood normal.        Behavior: Behavior normal.        Thought Content: Thought content normal.    LABS:      Latest Ref Rng & Units 08/12/2024    9:54 AM 07/14/2024   11:03 AM 06/17/2024   10:15 AM  CBC  WBC 4.0 - 10.5  K/uL 6.4  7.7  6.8   Hemoglobin 12.0 - 15.0 g/dL 86.6  86.6  86.7   Hematocrit 36.0 - 46.0 % 39.3  40.0  39.9   Platelets 150 - 400 K/uL 220  214  194       Latest Ref Rng & Units 08/12/2024    9:54 AM 07/14/2024   11:03 AM 06/17/2024   10:15 AM  CMP  Glucose 70 - 99 mg/dL 894  893  90   BUN 8 - 23 mg/dL 10  14  13    Creatinine 0.44 - 1.00 mg/dL 9.24  9.27  9.27   Sodium 135 - 145 mmol/L 142  140  142   Potassium 3.5 - 5.1 mmol/L 3.9  4.0  4.3   Chloride 98 - 111 mmol/L 105  104  106   CO2 22 - 32 mmol/L 23  23  24    Calcium 8.9 - 10.3 mg/dL 9.0  8.9  8.7   Total Protein 6.5 - 8.1 g/dL 6.9  6.8  6.7   Total Bilirubin 0.0 - 1.2 mg/dL 0.4  0.3  0.3   Alkaline Phos 38 - 126 U/L 72  75  75   AST 15 - 41 U/L 25  26  33   ALT 0 - 44 U/L 20  23  26     ASSESSMENT &  PLAN:  Assessment/Plan:  A 73 y.o. female with metastatic renal cell carcinoma.  She will proceed with her 43rd cycle of maintenance nivolumab  today.  Clinically, the patient is doing well.  I will see her back in 4 weeks before she heads into her 44th cycle of maintenance nivolumab .  The patient understands all the plans discussed today and is in agreement with them.    Amaris Delafuente DELENA Kerns, MD

## 2024-08-12 ENCOUNTER — Inpatient Hospital Stay: Attending: Oncology

## 2024-08-12 ENCOUNTER — Inpatient Hospital Stay

## 2024-08-12 ENCOUNTER — Inpatient Hospital Stay (HOSPITAL_BASED_OUTPATIENT_CLINIC_OR_DEPARTMENT_OTHER): Admitting: Oncology

## 2024-08-12 ENCOUNTER — Encounter: Payer: Self-pay | Admitting: Oncology

## 2024-08-12 VITALS — BP 146/68 | HR 62 | Temp 98.4°F | Resp 14

## 2024-08-12 VITALS — BP 146/68 | HR 62 | Temp 98.4°F | Resp 14 | Ht 61.0 in | Wt 137.2 lb

## 2024-08-12 DIAGNOSIS — Z79620 Long term (current) use of immunosuppressive biologic: Secondary | ICD-10-CM | POA: Diagnosis not present

## 2024-08-12 DIAGNOSIS — D3001 Benign neoplasm of right kidney: Secondary | ICD-10-CM

## 2024-08-12 DIAGNOSIS — Z5112 Encounter for antineoplastic immunotherapy: Secondary | ICD-10-CM | POA: Diagnosis not present

## 2024-08-12 DIAGNOSIS — C641 Malignant neoplasm of right kidney, except renal pelvis: Secondary | ICD-10-CM | POA: Diagnosis not present

## 2024-08-12 LAB — CBC WITH DIFFERENTIAL (CANCER CENTER ONLY)
Abs Immature Granulocytes: 0.01 K/uL (ref 0.00–0.07)
Basophils Absolute: 0 K/uL (ref 0.0–0.1)
Basophils Relative: 1 %
Eosinophils Absolute: 0.3 K/uL (ref 0.0–0.5)
Eosinophils Relative: 5 %
HCT: 39.3 % (ref 36.0–46.0)
Hemoglobin: 13.3 g/dL (ref 12.0–15.0)
Immature Granulocytes: 0 %
Lymphocytes Relative: 19 %
Lymphs Abs: 1.2 K/uL (ref 0.7–4.0)
MCH: 33.2 pg (ref 26.0–34.0)
MCHC: 33.8 g/dL (ref 30.0–36.0)
MCV: 98 fL (ref 80.0–100.0)
Monocytes Absolute: 0.6 K/uL (ref 0.1–1.0)
Monocytes Relative: 10 %
Neutro Abs: 4.2 K/uL (ref 1.7–7.7)
Neutrophils Relative %: 65 %
Platelet Count: 220 K/uL (ref 150–400)
RBC: 4.01 MIL/uL (ref 3.87–5.11)
RDW: 13.2 % (ref 11.5–15.5)
WBC Count: 6.4 K/uL (ref 4.0–10.5)
nRBC: 0 % (ref 0.0–0.2)

## 2024-08-12 LAB — CMP (CANCER CENTER ONLY)
ALT: 20 U/L (ref 0–44)
AST: 25 U/L (ref 15–41)
Albumin: 4.4 g/dL (ref 3.5–5.0)
Alkaline Phosphatase: 72 U/L (ref 38–126)
Anion gap: 14 (ref 5–15)
BUN: 10 mg/dL (ref 8–23)
CO2: 23 mmol/L (ref 22–32)
Calcium: 9 mg/dL (ref 8.9–10.3)
Chloride: 105 mmol/L (ref 98–111)
Creatinine: 0.75 mg/dL (ref 0.44–1.00)
GFR, Estimated: >60 mL/min (ref >60)
Glucose, Bld: 105 mg/dL — ABNORMAL HIGH (ref 70–99)
Potassium: 3.9 mmol/L (ref 3.5–5.1)
Sodium: 142 mmol/L (ref 135–145)
Total Bilirubin: 0.4 mg/dL (ref 0.0–1.2)
Total Protein: 6.9 g/dL (ref 6.5–8.1)

## 2024-08-12 LAB — TSH: TSH: 1.358 u[IU]/mL (ref 0.350–4.500)

## 2024-08-12 MED ORDER — SODIUM CHLORIDE 0.9 % IV SOLN
480.0000 mg | Freq: Once | INTRAVENOUS | Status: AC
  Administered 2024-08-12: 480 mg via INTRAVENOUS
  Filled 2024-08-12: qty 48

## 2024-08-12 MED ORDER — SODIUM CHLORIDE 0.9% FLUSH: 10.0000 mL | INTRAVENOUS | Status: DC | PRN

## 2024-08-12 MED ORDER — SODIUM CHLORIDE 0.9 % IV SOLN
Freq: Once | INTRAVENOUS | Status: AC
Start: 2024-08-12 — End: 2024-08-12

## 2024-08-12 NOTE — Patient Instructions (Signed)
 Nivolumab Injection What is this medication? NIVOLUMAB (nye VOL ue mab) treats some types of cancer. It works by helping your immune system slow or stop the spread of cancer cells. It is a monoclonal antibody. This medicine may be used for other purposes; ask your health care provider or pharmacist if you have questions. COMMON BRAND NAME(S): Opdivo What should I tell my care team before I take this medication? They need to know if you have any of these conditions: Allogeneic stem cell transplant (uses someone else's stem cells) Autoimmune diseases, such as Crohn disease, ulcerative colitis, lupus History of chest radiation Nervous system problems, such as Guillain-Barre syndrome or myasthenia gravis Organ transplant An unusual or allergic reaction to nivolumab, other medications, foods, dyes, or preservatives Pregnant or trying to get pregnant Breast-feeding How should I use this medication? This medication is infused into a vein. It is given in a hospital or clinic setting. A special MedGuide will be given to you before each treatment. Be sure to read this information carefully each time. Talk to your care team about the use of this medication in children. While it may be prescribed for children as young as 12 years for selected conditions, precautions do apply. Overdosage: If you think you have taken too much of this medicine contact a poison control center or emergency room at once. NOTE: This medicine is only for you. Do not share this medicine with others. What if I miss a dose? Keep appointments for follow-up doses. It is important not to miss your dose. Call your care team if you are unable to keep an appointment. What may interact with this medication? Interactions have not been studied. This list may not describe all possible interactions. Give your health care provider a list of all the medicines, herbs, non-prescription drugs, or dietary supplements you use. Also tell them if you  smoke, drink alcohol, or use illegal drugs. Some items may interact with your medicine. What should I watch for while using this medication? Your condition will be monitored carefully while you are receiving this medication. You may need blood work while taking this medication. This medication may cause serious skin reactions. They can happen weeks to months after starting the medication. Contact your care team right away if you notice fevers or flu-like symptoms with a rash. The rash may be red or purple and then turn into blisters or peeling of the skin. You may also notice a red rash with swelling of the face, lips, or lymph nodes in your neck or under your arms. Tell your care team right away if you have any change in your eyesight. Talk to your care team if you are pregnant or think you might be pregnant. A negative pregnancy test is required before starting this medication. A reliable form of contraception is recommended while taking this medication and for 5 months after the last dose. Talk to your care team about effective forms of contraception. Do not breast-feed while taking this medication and for 5 months after the last dose. What side effects may I notice from receiving this medication? Side effects that you should report to your care team as soon as possible: Allergic reactions--skin rash, itching, hives, swelling of the face, lips, tongue, or throat Dry cough, shortness of breath or trouble breathing Eye pain, redness, irritation, or discharge with blurry or decreased vision Heart muscle inflammation--unusual weakness or fatigue, shortness of breath, chest pain, fast or irregular heartbeat, dizziness, swelling of the ankles, feet, or hands Hormone  gland problems--headache, sensitivity to light, unusual weakness or fatigue, dizziness, fast or irregular heartbeat, increased sensitivity to cold or heat, excessive sweating, constipation, hair loss, increased thirst or amount of urine,  tremors or shaking, irritability Infusion reactions--chest pain, shortness of breath or trouble breathing, feeling faint or lightheaded Kidney injury (glomerulonephritis)--decrease in the amount of urine, red or dark brown urine, foamy or bubbly urine, swelling of the ankles, hands, or feet Liver injury--right upper belly pain, loss of appetite, nausea, light-colored stool, dark yellow or brown urine, yellowing skin or eyes, unusual weakness or fatigue Pain, tingling, or numbness in the hands or feet, muscle weakness, change in vision, confusion or trouble speaking, loss of balance or coordination, trouble walking, seizures Rash, fever, and swollen lymph nodes Redness, blistering, peeling, or loosening of the skin, including inside the mouth Sudden or severe stomach pain, bloody diarrhea, fever, nausea, vomiting Side effects that usually do not require medical attention (report these to your care team if they continue or are bothersome): Bone, joint, or muscle pain Diarrhea Fatigue Loss of appetite Nausea Skin rash This list may not describe all possible side effects. Call your doctor for medical advice about side effects. You may report side effects to FDA at 1-800-FDA-1088. Where should I keep my medication? This medication is given in a hospital or clinic. It will not be stored at home. NOTE: This sheet is a summary. It may not cover all possible information. If you have questions about this medicine, talk to your doctor, pharmacist, or health care provider.  2024 Elsevier/Gold Standard (2022-04-08 00:00:00)

## 2024-08-13 ENCOUNTER — Other Ambulatory Visit: Payer: Self-pay

## 2024-08-13 LAB — T4: T4, Total: 5.5 ug/dL (ref 4.5–12.0)

## 2024-08-28 ENCOUNTER — Other Ambulatory Visit: Payer: Self-pay

## 2024-09-08 DIAGNOSIS — H4720 Unspecified optic atrophy: Secondary | ICD-10-CM | POA: Diagnosis not present

## 2024-09-08 DIAGNOSIS — H524 Presbyopia: Secondary | ICD-10-CM | POA: Diagnosis not present

## 2024-09-08 DIAGNOSIS — H25813 Combined forms of age-related cataract, bilateral: Secondary | ICD-10-CM | POA: Diagnosis not present

## 2024-09-08 DIAGNOSIS — H52223 Regular astigmatism, bilateral: Secondary | ICD-10-CM | POA: Diagnosis not present

## 2024-09-08 DIAGNOSIS — H5231 Anisometropia: Secondary | ICD-10-CM | POA: Diagnosis not present

## 2024-09-08 DIAGNOSIS — H47091 Other disorders of optic nerve, not elsewhere classified, right eye: Secondary | ICD-10-CM | POA: Diagnosis not present

## 2024-09-08 DIAGNOSIS — H04123 Dry eye syndrome of bilateral lacrimal glands: Secondary | ICD-10-CM | POA: Diagnosis not present

## 2024-09-08 NOTE — Progress Notes (Unsigned)
 [facility] at [facility] [address] [place],  [place]  [address] [phone]  Clinic Day:  09/09/2024  Referring physician: Ezzard Valaria LABOR, MD  HISTORY OF PRESENT ILLNESS:  The patient is a 73 y.o. female with metastatic renal cell carcinoma, proven per cytology from a right-sided thoracentesis in early December 2021.  She comes in today to be evaluated before heading into her 44th cycle of maintenance nivolumab . The patient tolerated her last cycle well without having any significant difficulties.  She denies having any worsening cough, shortness of breath, or other systemic symptoms which concern her for overt signs of disease progression.  This patient's maintenance nivolumab  was preceded by 3 cycles of nivolumab /ipilimumab .  A 4th cycle was not given due to her being hospitalized for severe colitis related to the ipilimumab  component of her combination immunotherapy.     PHYSICAL EXAM:  Blood pressure 125/67, pulse 68, temperature 97.8 F (36.6 C), temperature source Oral, resp. rate 14, height 5' 1 (1.549 m), weight 136 lb (61.7 kg), SpO2 96%. Wt Readings from Last 3 Encounters:  09/09/24 136 lb (61.7 kg)  08/12/24 137 lb 3.2 oz (62.2 kg)  07/15/24 140 lb 0.6 oz (63.5 kg)   Body mass index is 25.7 kg/m. Performance status (ECOG): 1 - Symptomatic but completely ambulatory Physical Exam Constitutional:      Appearance: Normal appearance. She is not ill-appearing.  HENT:     Mouth/Throat:     Mouth: Mucous membranes are moist.     Pharynx: Oropharynx is clear. No oropharyngeal exudate or posterior oropharyngeal erythema.  Cardiovascular:     Rate and Rhythm: Normal rate and regular rhythm.     Heart sounds: No murmur heard.    No friction rub. No gallop.  Pulmonary:     Effort: Pulmonary effort is normal. No respiratory distress.     Breath sounds: Examination of the right-lower field reveals decreased breath sounds. Decreased breath sounds present.  No wheezing, rhonchi or rales.  Abdominal:     General: Bowel sounds are normal. There is no distension.     Palpations: Abdomen is soft. There is no mass.     Tenderness: There is no abdominal tenderness.  Musculoskeletal:        General: No swelling.     Right lower leg: No edema.     Left lower leg: No edema.  Lymphadenopathy:     Cervical: No cervical adenopathy.     Upper Body:     Right upper body: No supraclavicular or axillary adenopathy.     Left upper body: No supraclavicular or axillary adenopathy.     Lower Body: No right inguinal adenopathy. No left inguinal adenopathy.  Skin:    General: Skin is warm.     Coloration: Skin is not jaundiced.     Findings: No lesion or rash.  Neurological:     General: No focal deficit present.     Mental Status: She is alert and oriented to person, place, and time. Mental status is at baseline.  Psychiatric:        Mood and Affect: Mood normal.        Behavior: Behavior normal.        Thought Content: Thought content normal.    LABS:      Latest Ref Rng & Units 09/09/2024   10:42 AM 08/12/2024    9:54 AM 07/14/2024   11:03 AM  CBC  WBC 4.0 - 10.5 K/uL 7.2  6.4  7.7   Hemoglobin 12.0 - 15.0 g/dL 86.8  86.6  86.6   Hematocrit 36.0 - 46.0 % 39.2  39.3  40.0   Platelets 150 - 400 K/uL 205  220  214       Latest Ref Rng & Units 09/09/2024   10:42 AM 08/12/2024    9:54 AM 07/14/2024   11:03 AM  CMP  Glucose 70 - 99 mg/dL 889  894  893   BUN 8 - 23 mg/dL 9  10  14    Creatinine 0.44 - 1.00 mg/dL 9.33  9.24  9.27   Sodium 135 - 145 mmol/L 140  142  140   Potassium 3.5 - 5.1 mmol/L 4.0  3.9  4.0   Chloride 98 - 111 mmol/L 104  105  104   CO2 22 - 32 mmol/L 23  23  23    Calcium 8.9 - 10.3 mg/dL 9.0  9.0  8.9   Total Protein 6.5 - 8.1 g/dL 7.0  6.9  6.8   Total Bilirubin 0.0 - 1.2 mg/dL 0.4  0.4  0.3   Alkaline Phos 38 - 126 U/L 78  72  75   AST 15 - 41 U/L 30  25  26    ALT 0 - 44 U/L 19  20  23     ASSESSMENT & PLAN:   Assessment/Plan:  A 73 y.o. female with metastatic renal cell carcinoma.  She will proceed with her 44th cycle of maintenance nivolumab  today.  Clinically, the patient is doing well.  I will see her back in 4 weeks before she heads into her 45th cycle of maintenance nivolumab .  The patient understands all the plans discussed today and is in agreement with them.    Johney Perotti DELENA Kerns, MD

## 2024-09-09 ENCOUNTER — Inpatient Hospital Stay: Attending: Oncology

## 2024-09-09 ENCOUNTER — Inpatient Hospital Stay (HOSPITAL_BASED_OUTPATIENT_CLINIC_OR_DEPARTMENT_OTHER): Admitting: Oncology

## 2024-09-09 ENCOUNTER — Inpatient Hospital Stay

## 2024-09-09 ENCOUNTER — Encounter: Payer: Self-pay | Admitting: Oncology

## 2024-09-09 VITALS — BP 125/67 | HR 68 | Temp 97.8°F | Resp 14 | Ht 61.0 in | Wt 136.0 lb

## 2024-09-09 DIAGNOSIS — C641 Malignant neoplasm of right kidney, except renal pelvis: Secondary | ICD-10-CM | POA: Diagnosis not present

## 2024-09-09 DIAGNOSIS — D3001 Benign neoplasm of right kidney: Secondary | ICD-10-CM

## 2024-09-09 DIAGNOSIS — Z5112 Encounter for antineoplastic immunotherapy: Secondary | ICD-10-CM | POA: Diagnosis not present

## 2024-09-09 DIAGNOSIS — Z79620 Long term (current) use of immunosuppressive biologic: Secondary | ICD-10-CM | POA: Insufficient documentation

## 2024-09-09 LAB — CBC WITH DIFFERENTIAL (CANCER CENTER ONLY)
Abs Immature Granulocytes: 0.03 K/uL (ref 0.00–0.07)
Basophils Absolute: 0.1 K/uL (ref 0.0–0.1)
Basophils Relative: 1 %
Eosinophils Absolute: 0.3 K/uL (ref 0.0–0.5)
Eosinophils Relative: 4 %
HCT: 39.2 % (ref 36.0–46.0)
Hemoglobin: 13.1 g/dL (ref 12.0–15.0)
Immature Granulocytes: 0 %
Lymphocytes Relative: 16 %
Lymphs Abs: 1.2 K/uL (ref 0.7–4.0)
MCH: 33.1 pg (ref 26.0–34.0)
MCHC: 33.4 g/dL (ref 30.0–36.0)
MCV: 99 fL (ref 80.0–100.0)
Monocytes Absolute: 0.7 K/uL (ref 0.1–1.0)
Monocytes Relative: 10 %
Neutro Abs: 4.9 K/uL (ref 1.7–7.7)
Neutrophils Relative %: 69 %
Platelet Count: 205 K/uL (ref 150–400)
RBC: 3.96 MIL/uL (ref 3.87–5.11)
RDW: 13.1 % (ref 11.5–15.5)
WBC Count: 7.2 K/uL (ref 4.0–10.5)
nRBC: 0 % (ref 0.0–0.2)

## 2024-09-09 LAB — CMP (CANCER CENTER ONLY)
ALT: 19 U/L (ref 0–44)
AST: 30 U/L (ref 15–41)
Albumin: 4.2 g/dL (ref 3.5–5.0)
Alkaline Phosphatase: 78 U/L (ref 38–126)
Anion gap: 13 (ref 5–15)
BUN: 9 mg/dL (ref 8–23)
CO2: 23 mmol/L (ref 22–32)
Calcium: 9 mg/dL (ref 8.9–10.3)
Chloride: 104 mmol/L (ref 98–111)
Creatinine: 0.66 mg/dL (ref 0.44–1.00)
GFR, Estimated: >60 mL/min (ref >60)
Glucose, Bld: 110 mg/dL — ABNORMAL HIGH (ref 70–99)
Potassium: 4 mmol/L (ref 3.5–5.1)
Sodium: 140 mmol/L (ref 135–145)
Total Bilirubin: 0.4 mg/dL (ref 0.0–1.2)
Total Protein: 7 g/dL (ref 6.5–8.1)

## 2024-09-09 LAB — TSH: TSH: 1.33 u[IU]/mL (ref 0.350–4.500)

## 2024-09-09 MED ORDER — SODIUM CHLORIDE 0.9 % IV SOLN: Freq: Once | INTRAVENOUS | Status: AC

## 2024-09-09 MED ORDER — SODIUM CHLORIDE 0.9 % IV SOLN
480.0000 mg | Freq: Once | INTRAVENOUS | Status: AC
  Administered 2024-09-09: 480 mg via INTRAVENOUS
  Filled 2024-09-09: qty 48

## 2024-09-09 NOTE — Patient Instructions (Signed)
 Nivolumab Injection What is this medication? NIVOLUMAB (nye VOL ue mab) treats some types of cancer. It works by helping your immune system slow or stop the spread of cancer cells. It is a monoclonal antibody. This medicine may be used for other purposes; ask your health care provider or pharmacist if you have questions. COMMON BRAND NAME(S): Opdivo What should I tell my care team before I take this medication? They need to know if you have any of these conditions: Allogeneic stem cell transplant (uses someone else's stem cells) Autoimmune diseases, such as Crohn disease, ulcerative colitis, lupus History of chest radiation Nervous system problems, such as Guillain-Barre syndrome or myasthenia gravis Organ transplant An unusual or allergic reaction to nivolumab, other medications, foods, dyes, or preservatives Pregnant or trying to get pregnant Breast-feeding How should I use this medication? This medication is infused into a vein. It is given in a hospital or clinic setting. A special MedGuide will be given to you before each treatment. Be sure to read this information carefully each time. Talk to your care team about the use of this medication in children. While it may be prescribed for children as young as 12 years for selected conditions, precautions do apply. Overdosage: If you think you have taken too much of this medicine contact a poison control center or emergency room at once. NOTE: This medicine is only for you. Do not share this medicine with others. What if I miss a dose? Keep appointments for follow-up doses. It is important not to miss your dose. Call your care team if you are unable to keep an appointment. What may interact with this medication? Interactions have not been studied. This list may not describe all possible interactions. Give your health care provider a list of all the medicines, herbs, non-prescription drugs, or dietary supplements you use. Also tell them if you  smoke, drink alcohol, or use illegal drugs. Some items may interact with your medicine. What should I watch for while using this medication? Your condition will be monitored carefully while you are receiving this medication. You may need blood work while taking this medication. This medication may cause serious skin reactions. They can happen weeks to months after starting the medication. Contact your care team right away if you notice fevers or flu-like symptoms with a rash. The rash may be red or purple and then turn into blisters or peeling of the skin. You may also notice a red rash with swelling of the face, lips, or lymph nodes in your neck or under your arms. Tell your care team right away if you have any change in your eyesight. Talk to your care team if you are pregnant or think you might be pregnant. A negative pregnancy test is required before starting this medication. A reliable form of contraception is recommended while taking this medication and for 5 months after the last dose. Talk to your care team about effective forms of contraception. Do not breast-feed while taking this medication and for 5 months after the last dose. What side effects may I notice from receiving this medication? Side effects that you should report to your care team as soon as possible: Allergic reactions--skin rash, itching, hives, swelling of the face, lips, tongue, or throat Dry cough, shortness of breath or trouble breathing Eye pain, redness, irritation, or discharge with blurry or decreased vision Heart muscle inflammation--unusual weakness or fatigue, shortness of breath, chest pain, fast or irregular heartbeat, dizziness, swelling of the ankles, feet, or hands Hormone  gland problems--headache, sensitivity to light, unusual weakness or fatigue, dizziness, fast or irregular heartbeat, increased sensitivity to cold or heat, excessive sweating, constipation, hair loss, increased thirst or amount of urine,  tremors or shaking, irritability Infusion reactions--chest pain, shortness of breath or trouble breathing, feeling faint or lightheaded Kidney injury (glomerulonephritis)--decrease in the amount of urine, red or dark brown urine, foamy or bubbly urine, swelling of the ankles, hands, or feet Liver injury--right upper belly pain, loss of appetite, nausea, light-colored stool, dark yellow or brown urine, yellowing skin or eyes, unusual weakness or fatigue Pain, tingling, or numbness in the hands or feet, muscle weakness, change in vision, confusion or trouble speaking, loss of balance or coordination, trouble walking, seizures Rash, fever, and swollen lymph nodes Redness, blistering, peeling, or loosening of the skin, including inside the mouth Sudden or severe stomach pain, bloody diarrhea, fever, nausea, vomiting Side effects that usually do not require medical attention (report these to your care team if they continue or are bothersome): Bone, joint, or muscle pain Diarrhea Fatigue Loss of appetite Nausea Skin rash This list may not describe all possible side effects. Call your doctor for medical advice about side effects. You may report side effects to FDA at 1-800-FDA-1088. Where should I keep my medication? This medication is given in a hospital or clinic. It will not be stored at home. NOTE: This sheet is a summary. It may not cover all possible information. If you have questions about this medicine, talk to your doctor, pharmacist, or health care provider.  2024 Elsevier/Gold Standard (2022-04-08 00:00:00)   [facility] [place] [facility]  Discharge Instructions: Thank you for choosing [facility] to provide your oncology and hematology care.  If you have a lab appointment with the Cancer Center, please go directly to the Cancer Center and check in at the registration area.   Wear comfortable clothing and clothing appropriate for easy  access to any Portacath or PICC line.   We strive to give you quality time with your provider. You may need to reschedule your appointment if you arrive late (15 or more minutes).  Arriving late affects you and other patients whose appointments are after yours.  Also, if you miss three or more appointments without notifying the office, you may be dismissed from the clinic at the provider's discretion.      For prescription refill requests, have your pharmacy contact our office and allow 72 hours for refills to be completed.    Today you received the following chemotherapy and/or immunotherapy agents NIVOLUMAB      To help prevent nausea and vomiting after your treatment, we encourage you to take your nausea medication as directed.  BELOW ARE SYMPTOMS THAT SHOULD BE REPORTED IMMEDIATELY: *FEVER GREATER THAN 100.4 F (38 C) OR HIGHER *CHILLS OR SWEATING *NAUSEA AND VOMITING THAT IS NOT CONTROLLED WITH YOUR NAUSEA MEDICATION *UNUSUAL SHORTNESS OF BREATH *UNUSUAL BRUISING OR BLEEDING *URINARY PROBLEMS (pain or burning when urinating, or frequent urination) *BOWEL PROBLEMS (unusual diarrhea, constipation, pain near the anus) TENDERNESS IN MOUTH AND THROAT WITH OR WITHOUT PRESENCE OF ULCERS (sore throat, sores in mouth, or a toothache) UNUSUAL RASH, SWELLING OR PAIN  UNUSUAL VAGINAL DISCHARGE OR ITCHING   Items with * indicate a potential emergency and should be followed up as soon as possible or go to the Emergency Department if any problems should occur.  Please show the CHEMOTHERAPY ALERT CARD or IMMUNOTHERAPY ALERT CARD at  check-in to the Emergency Department and triage nurse.  Should you have questions after your visit or need to cancel or reschedule your appointment, please contact [facility] [place] - [facility]  Dept: [phone]  and follow the prompts.  Office hours are 8:00 a.m. to 4:30 p.m. Monday - Friday. Please note that voicemails left after  4:00 p.m. may not be returned until the following business day.  We are closed weekends and major holidays. You have access to a nurse at all times for urgent questions. Please call the main number to the clinic Dept: [phone] and follow the prompts.  For any non-urgent questions, you may also contact your provider using MyChart. We now offer e-Visits for anyone 58 and older to request care online for non-urgent symptoms. For details visit [email or web address].   Also download the MyChart app! Go to the app store, search "MyChart", open the app, select [place], and log in with your MyChart username and password.

## 2024-09-10 LAB — T4: T4, Total: 5.5 ug/dL (ref 4.5–12.0)

## 2024-09-15 DIAGNOSIS — Z6824 Body mass index (BMI) 24.0-24.9, adult: Secondary | ICD-10-CM | POA: Diagnosis not present

## 2024-09-15 DIAGNOSIS — I889 Nonspecific lymphadenitis, unspecified: Secondary | ICD-10-CM | POA: Diagnosis not present

## 2024-09-15 DIAGNOSIS — C641 Malignant neoplasm of right kidney, except renal pelvis: Secondary | ICD-10-CM | POA: Diagnosis not present

## 2024-10-06 NOTE — Progress Notes (Signed)
 [facility] [address] [place],  [place]  [address] [phone]  Clinic Day:  10/07/2024  Referring physician: Ezzard Valaria LABOR, MD  HISTORY OF PRESENT ILLNESS:  The patient is a 73 y.o. female with metastatic renal cell carcinoma, proven per cytology from a right-sided thoracentesis in early December 2021.  She comes in today to be evaluated before heading into her 45th cycle of maintenance nivolumab . The patient tolerated her last cycle well without having any significant difficulties.  She denies having any worsening cough, shortness of breath, or other systemic symptoms which concern her for overt signs of disease progression.  This patient's maintenance nivolumab  was preceded by 3 cycles of nivolumab /ipilimumab .  A 4th cycle was not given due to her being hospitalized for severe colitis related to the ipilimumab  component of her combination immunotherapy.     PHYSICAL EXAM:  Blood pressure (!) 143/66, pulse 63, temperature 98.6 F (37 C), temperature source Oral, resp. rate 14, height 5' 1 (1.549 m), weight 135 lb 9.6 oz (61.5 kg), SpO2 95%. Wt Readings from Last 3 Encounters:  10/19/24 133 lb 14.4 oz (60.7 kg)  10/07/24 135 lb 9.6 oz (61.5 kg)  09/09/24 136 lb (61.7 kg)   Body mass index is 25.62 kg/m. Performance status (ECOG): 1 - Symptomatic but completely ambulatory Physical Exam Constitutional:      Appearance: Normal appearance. She is not ill-appearing.  HENT:     Mouth/Throat:     Mouth: Mucous membranes are moist.     Pharynx: Oropharynx is clear. No oropharyngeal exudate or posterior oropharyngeal erythema.  Cardiovascular:     Rate and Rhythm: Normal rate and regular rhythm.     Heart sounds: No murmur heard.    No friction rub. No gallop.  Pulmonary:     Effort: Pulmonary effort is normal. No respiratory distress.     Breath sounds: Examination of the right-lower field reveals decreased breath sounds. Decreased breath  sounds present. No wheezing, rhonchi or rales.  Abdominal:     General: Bowel sounds are normal. There is no distension.     Palpations: Abdomen is soft. There is no mass.     Tenderness: There is no abdominal tenderness.  Musculoskeletal:        General: No swelling.     Right lower leg: No edema.     Left lower leg: No edema.  Lymphadenopathy:     Cervical: Cervical adenopathy present.     Right cervical: Superficial cervical adenopathy (2-3 cm node palpated) present.     Upper Body:     Right upper body: No supraclavicular or axillary adenopathy.     Left upper body: No supraclavicular or axillary adenopathy.     Lower Body: No right inguinal adenopathy. No left inguinal adenopathy.  Skin:    General: Skin is warm.     Coloration: Skin is not jaundiced.     Findings: No lesion or rash.  Neurological:     General: No focal deficit present.     Mental Status: She is alert and oriented to person, place, and time. Mental status is at baseline.  Psychiatric:        Mood and Affect: Mood normal.        Behavior: Behavior normal.        Thought Content: Thought content normal.    LABS:      Latest Ref Rng & Units 10/07/2024    9:57 AM 09/09/2024   10:42 AM  08/12/2024    9:54 AM  CBC  WBC 4.0 - 10.5 K/uL 5.9  7.2  6.4   Hemoglobin 12.0 - 15.0 g/dL 86.8  86.8  86.6   Hematocrit 36.0 - 46.0 % 39.6  39.2  39.3   Platelets 150 - 400 K/uL 202  205  220       Latest Ref Rng & Units 10/07/2024    9:57 AM 09/09/2024   10:42 AM 08/12/2024    9:54 AM  CMP  Glucose 70 - 99 mg/dL 865  889  894   BUN 8 - 23 mg/dL 12  9  10    Creatinine 0.44 - 1.00 mg/dL 9.27  9.33  9.24   Sodium 135 - 145 mmol/L 141  140  142   Potassium 3.5 - 5.1 mmol/L 3.7  4.0  3.9   Chloride 98 - 111 mmol/L 105  104  105   CO2 22 - 32 mmol/L 24  23  23    Calcium 8.9 - 10.3 mg/dL 9.1  9.0  9.0   Total Protein 6.5 - 8.1 g/dL 6.8  7.0  6.9   Total Bilirubin 0.0 - 1.2 mg/dL 0.4  0.4  0.4   Alkaline Phos 38 - 126  U/L 65  78  72   AST 15 - 41 U/L 28  30  25    ALT 0 - 44 U/L 19  19  20     ASSESSMENT & PLAN:  Assessment/Plan:  A 73 y.o. female with metastatic renal cell carcinoma.  Her exam was particularly concerning today as I could feel a firm, right cervical lymph node.  As this is new, I will have her undergo a PET scan within the week to see if this and other lesions light up, which would be concerning for disease progression.  Nevertheless, she will proceed with her 45th cycle of maintenance nivolumab  today.  Clinically, the patient is doing well.  I will see her back in approximately 2 weeks to go over her PET scan images and their implications.  The patient understands all the plans discussed today and is in agreement with them.    Theodor Mustin DELENA Kerns, MD

## 2024-10-07 ENCOUNTER — Encounter: Payer: Self-pay | Admitting: Oncology

## 2024-10-07 ENCOUNTER — Inpatient Hospital Stay (HOSPITAL_BASED_OUTPATIENT_CLINIC_OR_DEPARTMENT_OTHER): Admitting: Oncology

## 2024-10-07 ENCOUNTER — Inpatient Hospital Stay

## 2024-10-07 ENCOUNTER — Other Ambulatory Visit: Payer: Self-pay | Admitting: Oncology

## 2024-10-07 ENCOUNTER — Inpatient Hospital Stay: Attending: Oncology

## 2024-10-07 VITALS — BP 143/66 | HR 63 | Temp 98.6°F | Resp 14 | Ht 61.0 in | Wt 135.6 lb

## 2024-10-07 DIAGNOSIS — D3001 Benign neoplasm of right kidney: Secondary | ICD-10-CM

## 2024-10-07 DIAGNOSIS — Z5112 Encounter for antineoplastic immunotherapy: Secondary | ICD-10-CM | POA: Insufficient documentation

## 2024-10-07 DIAGNOSIS — C641 Malignant neoplasm of right kidney, except renal pelvis: Secondary | ICD-10-CM | POA: Diagnosis not present

## 2024-10-07 DIAGNOSIS — Z79620 Long term (current) use of immunosuppressive biologic: Secondary | ICD-10-CM | POA: Diagnosis not present

## 2024-10-07 LAB — CBC WITH DIFFERENTIAL (CANCER CENTER ONLY)
Abs Immature Granulocytes: 0.01 K/uL (ref 0.00–0.07)
Basophils Absolute: 0 K/uL (ref 0.0–0.1)
Basophils Relative: 1 %
Eosinophils Absolute: 0.4 K/uL (ref 0.0–0.5)
Eosinophils Relative: 6 %
HCT: 39.6 % (ref 36.0–46.0)
Hemoglobin: 13.1 g/dL (ref 12.0–15.0)
Immature Granulocytes: 0 %
Lymphocytes Relative: 21 %
Lymphs Abs: 1.3 K/uL (ref 0.7–4.0)
MCH: 32.2 pg (ref 26.0–34.0)
MCHC: 33.1 g/dL (ref 30.0–36.0)
MCV: 97.3 fL (ref 80.0–100.0)
Monocytes Absolute: 0.6 K/uL (ref 0.1–1.0)
Monocytes Relative: 10 %
Neutro Abs: 3.7 K/uL (ref 1.7–7.7)
Neutrophils Relative %: 62 %
Platelet Count: 202 K/uL (ref 150–400)
RBC: 4.07 MIL/uL (ref 3.87–5.11)
RDW: 13.3 % (ref 11.5–15.5)
WBC Count: 5.9 K/uL (ref 4.0–10.5)
nRBC: 0 % (ref 0.0–0.2)

## 2024-10-07 LAB — TSH: TSH: 1.99 u[IU]/mL (ref 0.350–4.500)

## 2024-10-07 LAB — CMP (CANCER CENTER ONLY)
ALT: 19 U/L (ref 0–44)
AST: 28 U/L (ref 15–41)
Albumin: 4.4 g/dL (ref 3.5–5.0)
Alkaline Phosphatase: 65 U/L (ref 38–126)
Anion gap: 11 (ref 5–15)
BUN: 12 mg/dL (ref 8–23)
CO2: 24 mmol/L (ref 22–32)
Calcium: 9.1 mg/dL (ref 8.9–10.3)
Chloride: 105 mmol/L (ref 98–111)
Creatinine: 0.72 mg/dL (ref 0.44–1.00)
GFR, Estimated: >60 mL/min (ref >60)
Glucose, Bld: 134 mg/dL — ABNORMAL HIGH (ref 70–99)
Potassium: 3.7 mmol/L (ref 3.5–5.1)
Sodium: 141 mmol/L (ref 135–145)
Total Bilirubin: 0.4 mg/dL (ref 0.0–1.2)
Total Protein: 6.8 g/dL (ref 6.5–8.1)

## 2024-10-07 MED ORDER — SODIUM CHLORIDE 0.9 % IV SOLN
480.0000 mg | Freq: Once | INTRAVENOUS | Status: AC
  Administered 2024-10-07: 480 mg via INTRAVENOUS
  Filled 2024-10-07: qty 48

## 2024-10-07 MED ORDER — SODIUM CHLORIDE 0.9 % IV SOLN: Freq: Once | INTRAVENOUS | Status: AC

## 2024-10-07 NOTE — Patient Instructions (Signed)
 Nivolumab Injection What is this medication? NIVOLUMAB (nye VOL ue mab) treats some types of cancer. It works by helping your immune system slow or stop the spread of cancer cells. It is a monoclonal antibody. This medicine may be used for other purposes; ask your health care provider or pharmacist if you have questions. COMMON BRAND NAME(S): Opdivo What should I tell my care team before I take this medication? They need to know if you have any of these conditions: Allogeneic stem cell transplant (uses someone else's stem cells) Autoimmune diseases, such as Crohn disease, ulcerative colitis, lupus History of chest radiation Nervous system problems, such as Guillain-Barre syndrome or myasthenia gravis Organ transplant An unusual or allergic reaction to nivolumab, other medications, foods, dyes, or preservatives Pregnant or trying to get pregnant Breast-feeding How should I use this medication? This medication is infused into a vein. It is given in a hospital or clinic setting. A special MedGuide will be given to you before each treatment. Be sure to read this information carefully each time. Talk to your care team about the use of this medication in children. While it may be prescribed for children as young as 12 years for selected conditions, precautions do apply. Overdosage: If you think you have taken too much of this medicine contact a poison control center or emergency room at once. NOTE: This medicine is only for you. Do not share this medicine with others. What if I miss a dose? Keep appointments for follow-up doses. It is important not to miss your dose. Call your care team if you are unable to keep an appointment. What may interact with this medication? Interactions have not been studied. This list may not describe all possible interactions. Give your health care provider a list of all the medicines, herbs, non-prescription drugs, or dietary supplements you use. Also tell them if you  smoke, drink alcohol, or use illegal drugs. Some items may interact with your medicine. What should I watch for while using this medication? Your condition will be monitored carefully while you are receiving this medication. You may need blood work while taking this medication. This medication may cause serious skin reactions. They can happen weeks to months after starting the medication. Contact your care team right away if you notice fevers or flu-like symptoms with a rash. The rash may be red or purple and then turn into blisters or peeling of the skin. You may also notice a red rash with swelling of the face, lips, or lymph nodes in your neck or under your arms. Tell your care team right away if you have any change in your eyesight. Talk to your care team if you are pregnant or think you might be pregnant. A negative pregnancy test is required before starting this medication. A reliable form of contraception is recommended while taking this medication and for 5 months after the last dose. Talk to your care team about effective forms of contraception. Do not breast-feed while taking this medication and for 5 months after the last dose. What side effects may I notice from receiving this medication? Side effects that you should report to your care team as soon as possible: Allergic reactions--skin rash, itching, hives, swelling of the face, lips, tongue, or throat Dry cough, shortness of breath or trouble breathing Eye pain, redness, irritation, or discharge with blurry or decreased vision Heart muscle inflammation--unusual weakness or fatigue, shortness of breath, chest pain, fast or irregular heartbeat, dizziness, swelling of the ankles, feet, or hands Hormone  gland problems--headache, sensitivity to light, unusual weakness or fatigue, dizziness, fast or irregular heartbeat, increased sensitivity to cold or heat, excessive sweating, constipation, hair loss, increased thirst or amount of urine,  tremors or shaking, irritability Infusion reactions--chest pain, shortness of breath or trouble breathing, feeling faint or lightheaded Kidney injury (glomerulonephritis)--decrease in the amount of urine, red or dark brown urine, foamy or bubbly urine, swelling of the ankles, hands, or feet Liver injury--right upper belly pain, loss of appetite, nausea, light-colored stool, dark yellow or brown urine, yellowing skin or eyes, unusual weakness or fatigue Pain, tingling, or numbness in the hands or feet, muscle weakness, change in vision, confusion or trouble speaking, loss of balance or coordination, trouble walking, seizures Rash, fever, and swollen lymph nodes Redness, blistering, peeling, or loosening of the skin, including inside the mouth Sudden or severe stomach pain, bloody diarrhea, fever, nausea, vomiting Side effects that usually do not require medical attention (report these to your care team if they continue or are bothersome): Bone, joint, or muscle pain Diarrhea Fatigue Loss of appetite Nausea Skin rash This list may not describe all possible side effects. Call your doctor for medical advice about side effects. You may report side effects to FDA at 1-800-FDA-1088. Where should I keep my medication? This medication is given in a hospital or clinic. It will not be stored at home. NOTE: This sheet is a summary. It may not cover all possible information. If you have questions about this medicine, talk to your doctor, pharmacist, or health care provider.  2024 Elsevier/Gold Standard (2022-04-08 00:00:00)

## 2024-10-08 ENCOUNTER — Other Ambulatory Visit: Payer: Self-pay

## 2024-10-08 LAB — T4: T4, Total: 6.2 ug/dL (ref 4.5–12.0)

## 2024-10-15 ENCOUNTER — Encounter (HOSPITAL_BASED_OUTPATIENT_CLINIC_OR_DEPARTMENT_OTHER): Payer: Self-pay | Admitting: Radiology

## 2024-10-18 ENCOUNTER — Ambulatory Visit (HOSPITAL_BASED_OUTPATIENT_CLINIC_OR_DEPARTMENT_OTHER)
Admission: RE | Admit: 2024-10-18 | Discharge: 2024-10-18 | Disposition: A | Source: Ambulatory Visit | Attending: Oncology | Admitting: Oncology

## 2024-10-18 DIAGNOSIS — D3001 Benign neoplasm of right kidney: Secondary | ICD-10-CM | POA: Diagnosis not present

## 2024-10-18 MED ORDER — HEPARIN SOD (PORK) LOCK FLUSH 100 UNIT/ML IV SOLN
500.0000 [IU] | Freq: Once | INTRAVENOUS | Status: AC
  Administered 2024-10-18: 500 [IU] via INTRAVENOUS

## 2024-10-18 MED ORDER — FLUDEOXYGLUCOSE F - 18 (FDG) INJECTION
7.7000 | Freq: Once | INTRAVENOUS | Status: AC | PRN
  Administered 2024-10-18: 7.17 via INTRAVENOUS

## 2024-10-18 NOTE — Progress Notes (Unsigned)
 [facility] [address] [place],  [place]  [address] [phone]  Clinic Day:  10/19/2024  Referring physician: Fernand Tracey LABOR, MD  HISTORY OF PRESENT ILLNESS:  The patient is a 73 y.o. female with metastatic renal cell carcinoma, proven per cytology from a right-sided thoracentesis in early December 2021.  She comes in today to go over her PET scan images.  A PET scan was recently done as the patient had a hard, palpable mass/lymph node in her right upper cervical region.  For the last visit, the patient has been doing fairly well.  She denies having other symptoms or findings which concerned her for overt signs of disease progression.    Of note, this patient has had 46 cycles of maintenance nivolumab .  He is this patient's maintenance nivolumab  was preceded by 3 cycles of nivolumab /ipilimumab .  A 4th cycle was not given due to her being hospitalized for severe colitis related to the ipilimumab  component of her combination immunotherapy.     PHYSICAL EXAM:  Blood pressure (!) 143/67, pulse 77, temperature 98.3 F (36.8 C), temperature source Oral, resp. rate 16, height 5' 1 (1.549 m), weight 133 lb 14.4 oz (60.7 kg), SpO2 97%. Wt Readings from Last 3 Encounters:  10/19/24 133 lb 14.4 oz (60.7 kg)  10/07/24 135 lb 9.6 oz (61.5 kg)  09/09/24 136 lb (61.7 kg)   Body mass index is 25.3 kg/m. Performance status (ECOG): 1 - Symptomatic but completely ambulatory Physical Exam Constitutional:      Appearance: Normal appearance. She is not ill-appearing.  HENT:     Mouth/Throat:     Mouth: Mucous membranes are moist.     Pharynx: Oropharynx is clear. No oropharyngeal exudate or posterior oropharyngeal erythema.  Cardiovascular:     Rate and Rhythm: Normal rate and regular rhythm.     Heart sounds: No murmur heard.    No friction rub. No gallop.  Pulmonary:     Effort: Pulmonary effort is normal. No respiratory distress.     Breath sounds:  Examination of the right-lower field reveals decreased breath sounds. Decreased breath sounds present. No wheezing, rhonchi or rales.  Abdominal:     General: Bowel sounds are normal. There is no distension.     Palpations: Abdomen is soft. There is no mass.     Tenderness: There is no abdominal tenderness.  Musculoskeletal:        General: No swelling.     Right lower leg: No edema.     Left lower leg: No edema.  Lymphadenopathy:     Cervical: Cervical adenopathy present.     Right cervical: Superficial cervical adenopathy (firm, 2-3 cm node) present.     Upper Body:     Right upper body: No supraclavicular or axillary adenopathy.     Left upper body: No supraclavicular or axillary adenopathy.     Lower Body: No right inguinal adenopathy. No left inguinal adenopathy.  Skin:    General: Skin is warm.     Coloration: Skin is not jaundiced.     Findings: No lesion or rash.  Neurological:     General: No focal deficit present.     Mental Status: She is alert and oriented to person, place, and time. Mental status is at baseline.  Psychiatric:        Mood and Affect: Mood normal.        Behavior: Behavior normal.        Thought Content: Thought  content normal.   SCANS:  Her PET scan done on 10-18-24 revealed the following: FINDINGS:   HEAD AND NECK: Lymph node or nodule along the inferior margin of the parotid gland and adjacent to the external jugular vein has a short axis diameter of 1.0 cm on image 35 series 4 with maximum SUV 10.0. This is new compared to the previous PET CT. Substantial pannus posterior to the odontoid with associated erosion of the posterior odontoid better shown on the dedicated cervical spine CT from 08/10/2024. Bilateral common carotid atheromatous vascular calcification.   CHEST: Pleural thickening and calcification at the left lung apex and extending over the adjacent upper mediastinum, maximum SUV 6.0 (formerly 5.7). No morphologic change from  12/25/2020. Other pleural plaques on the left likewise demonstrate low grade metabolic activity similar to previous 2022 exam. Activity along the resected segment of the right 4th rib maximum SUV 3.9 (formerly 1.4 in this vicinity). Low grade activity along with pleural thickening posterolaterally along the right lung base with maximum SUV 4.0. Moderate to large right pleural effusion. Volume loss in the right middle lobe and right lower lobe. Wedge resection staple line at the right lung apex and at the left lung apex. Emphysema. Right port-a-cath tip in the lower SVC. Aortic, branch vessel, coronary, and systemic atherosclerosis. No metabolically active pulmonary nodules. No metabolically active lymphadenopathy.   ABDOMEN AND PELVIS: Representative section of the right kidney lower pole mass with a maximum SUV of 5.0 corresponding to the known renal cell carcinoma. This mass measures about 4.4 cm in diameter. Prominence of stool throughout the colon, favoring constipation. Small but abnormal amount of free pelvic fluid. No metabolically active lymphadenopathy. Physiologic activity within the genitourinary systems.   BONES AND SOFT TISSUE: Dextroconvex thoracolumbar scoliosis. Activity along the resected segment of the right 4th rib maximum SUV 3.9 (formerly 1.4 in this vicinity). Substantial pannus posterior to the odontoid with associated erosion of the posterior odontoid better shown on the dedicated cervical spine CT from 08/10/2024.   IMPRESSION: 1. New hypermetabolic 1.0 cm node or nodule inferior to the parotid gland adjacent to the external jugular vein, SUV max 10.0, new compared to prior study. 2. Right kidney lower pole mass corresponding to known renal cell carcinoma, SUV max 5.0, measuring approximately 4.4 cm. 3. Moderate to large right pleural effusion with associated volume loss in the right middle and lower lobes. 4. Atherosclerosis. 5. Pleural thickening and  calcification at the left lung apex and adjacent upper mediastinum, SUV max 6.0, stable compared to 12/25/20. Other left pleural plaques with similar low-grade metabolic activity as 2022. Increased activity along the resected segment of the right 4th rib, SUV max 3.9, increased from 1.4. Low-grade activity with pleural thickening at the right lung base, SUV max 4.0. Surveillance of these pleural lesions suggested. 6. Emphysema. 7. Dextroconvex thoracolumbar scoliosis. LABS:      Latest Ref Rng & Units 10/07/2024    9:57 AM 09/09/2024   10:42 AM 08/12/2024    9:54 AM  CBC  WBC 4.0 - 10.5 K/uL 5.9  7.2  6.4   Hemoglobin 12.0 - 15.0 g/dL 86.8  86.8  86.6   Hematocrit 36.0 - 46.0 % 39.6  39.2  39.3   Platelets 150 - 400 K/uL 202  205  220       Latest Ref Rng & Units 10/07/2024    9:57 AM 09/09/2024   10:42 AM 08/12/2024    9:54 AM  CMP  Glucose 70 - 99  mg/dL 865  889  894   BUN 8 - 23 mg/dL 12  9  10    Creatinine 0.44 - 1.00 mg/dL 9.27  9.33  9.24   Sodium 135 - 145 mmol/L 141  140  142   Potassium 3.5 - 5.1 mmol/L 3.7  4.0  3.9   Chloride 98 - 111 mmol/L 105  104  105   CO2 22 - 32 mmol/L 24  23  23    Calcium 8.9 - 10.3 mg/dL 9.1  9.0  9.0   Total Protein 6.5 - 8.1 g/dL 6.8  7.0  6.9   Total Bilirubin 0.0 - 1.2 mg/dL 0.4  0.4  0.4   Alkaline Phos 38 - 126 U/L 65  78  72   AST 15 - 41 U/L 28  30  25    ALT 0 - 44 U/L 19  19  20     ASSESSMENT & PLAN:  Assessment/Plan:  A 73 y.o. female with metastatic renal cell carcinoma.  In clinic today, I went over her PET scan images with her, for which the lesion palpated in her right cervical area is clearly hypermetabolic.  It represents the only definitive site of new disease, when compared to a PET scan she had done in January 2022.  I will have interventional radiology biopsy this lesion to determine its etiology.  I will see her back on Novermber 13th to go over her biopsy results, as well as to determine if she will proceed with cycle 47  of maintenance nivolumab . The patient understands all the plans discussed today and is in agreement with them.    Renee Erb DELENA Kerns, MD

## 2024-10-19 ENCOUNTER — Inpatient Hospital Stay (HOSPITAL_BASED_OUTPATIENT_CLINIC_OR_DEPARTMENT_OTHER): Admitting: Oncology

## 2024-10-19 ENCOUNTER — Other Ambulatory Visit: Payer: Self-pay | Admitting: Oncology

## 2024-10-19 VITALS — BP 143/67 | HR 77 | Temp 98.3°F | Resp 16 | Ht 61.0 in | Wt 133.9 lb

## 2024-10-19 DIAGNOSIS — Z5112 Encounter for antineoplastic immunotherapy: Secondary | ICD-10-CM | POA: Diagnosis not present

## 2024-10-19 DIAGNOSIS — C641 Malignant neoplasm of right kidney, except renal pelvis: Secondary | ICD-10-CM | POA: Diagnosis not present

## 2024-10-19 DIAGNOSIS — Z79620 Long term (current) use of immunosuppressive biologic: Secondary | ICD-10-CM | POA: Diagnosis not present

## 2024-10-26 ENCOUNTER — Encounter: Payer: Self-pay | Admitting: Oncology

## 2024-10-26 ENCOUNTER — Encounter: Payer: Self-pay | Admitting: Hematology and Oncology

## 2024-11-03 ENCOUNTER — Encounter: Payer: Self-pay | Admitting: Oncology

## 2024-11-03 NOTE — Progress Notes (Signed)
 [facility] at [facility] [address] [place],  [place]  [address] [phone]  Clinic Day:  11/04/2024  Referring physician: Fernand Tracey LABOR, MD  HISTORY OF PRESENT ILLNESS:  The patient is a 73 y.o. female with metastatic renal cell carcinoma, proven per cytology from a right-sided thoracentesis in early December 2021.  She comes in today to be evaluated before heading into her 47th cycle of maintenance nivolumab .  Overall, she continues to tolerate her therapy very well.  Of note, the patient had a recent PET scan which showed a hypermetabolic  node/mass in her right cervical chain which was concerning for some type of malignant process.  The hope was that this lesion would be biopsied before this visit.  However, this biopsy would not happen until early December 2025.  The patient denies that the area is causing her any particular problems.  With respect to her metastatic renal cell carcinoma treatment, this patient's maintenance nivolumab  was preceded by 3 cycles of nivolumab /ipilimumab .  A 4th cycle was not given due to her being hospitalized for severe colitis related to the ipilimumab  component of her combination immunotherapy.     PHYSICAL EXAM:  Blood pressure (!) 156/55, pulse 67, temperature 98.4 F (36.9 C), temperature source Oral, resp. rate 16, height 5' 1 (1.549 m), weight 134 lb 6.4 oz (61 kg), SpO2 97%. Wt Readings from Last 3 Encounters:  11/04/24 134 lb (60.8 kg)  11/04/24 134 lb 6.4 oz (61 kg)  10/19/24 133 lb 14.4 oz (60.7 kg)   Body mass index is 25.39 kg/m. Performance status (ECOG): 1 - Symptomatic but completely ambulatory Physical Exam Constitutional:      Appearance: Normal appearance. She is not ill-appearing.  HENT:     Mouth/Throat:     Mouth: Mucous membranes are moist.     Pharynx: Oropharynx is clear. No oropharyngeal exudate or posterior oropharyngeal erythema.  Cardiovascular:     Rate and Rhythm: Normal rate and regular  rhythm.     Heart sounds: No murmur heard.    No friction rub. No gallop.  Pulmonary:     Effort: Pulmonary effort is normal. No respiratory distress.     Breath sounds: Examination of the right-lower field reveals decreased breath sounds. Decreased breath sounds present. No wheezing, rhonchi or rales.  Abdominal:     General: Bowel sounds are normal. There is no distension.     Palpations: Abdomen is soft. There is no mass.     Tenderness: There is no abdominal tenderness.  Musculoskeletal:        General: No swelling.     Right lower leg: No edema.     Left lower leg: No edema.  Lymphadenopathy:     Cervical: Cervical adenopathy present.     Right cervical: Superficial cervical adenopathy (Still present and firm, but smaller in size at 1-2 cm) present.     Upper Body:     Right upper body: No supraclavicular or axillary adenopathy.     Left upper body: No supraclavicular or axillary adenopathy.     Lower Body: No right inguinal adenopathy. No left inguinal adenopathy.  Skin:    General: Skin is warm.     Coloration: Skin is not jaundiced.     Findings: No lesion or rash.  Neurological:     General: No focal deficit present.     Mental Status: She is alert and oriented to person, place, and time. Mental status is at baseline.  Psychiatric:  Mood and Affect: Mood normal.        Behavior: Behavior normal.        Thought Content: Thought content normal.   SCANS:  Her PET scan done on 10-18-24 revealed the following: FINDINGS:   HEAD AND NECK: Lymph node or nodule along the inferior margin of the parotid gland and adjacent to the external jugular vein has a short axis diameter of 1.0 cm on image 35 series 4 with maximum SUV 10.0. This is new compared to the previous PET CT. Substantial pannus posterior to the odontoid with associated erosion of the posterior odontoid better shown on the dedicated cervical spine CT from 08/10/2024. Bilateral common carotid atheromatous  vascular calcification.   CHEST: Pleural thickening and calcification at the left lung apex and extending over the adjacent upper mediastinum, maximum SUV 6.0 (formerly 5.7). No morphologic change from 12/25/2020. Other pleural plaques on the left likewise demonstrate low grade metabolic activity similar to previous 2022 exam. Activity along the resected segment of the right 4th rib maximum SUV 3.9 (formerly 1.4 in this vicinity). Low grade activity along with pleural thickening posterolaterally along the right lung base with maximum SUV 4.0. Moderate to large right pleural effusion. Volume loss in the right middle lobe and right lower lobe. Wedge resection staple line at the right lung apex and at the left lung apex. Emphysema. Right port-a-cath tip in the lower SVC. Aortic, branch vessel, coronary, and systemic atherosclerosis. No metabolically active pulmonary nodules. No metabolically active lymphadenopathy.   ABDOMEN AND PELVIS: Representative section of the right kidney lower pole mass with a maximum SUV of 5.0 corresponding to the known renal cell carcinoma. This mass measures about 4.4 cm in diameter. Prominence of stool throughout the colon, favoring constipation. Small but abnormal amount of free pelvic fluid. No metabolically active lymphadenopathy. Physiologic activity within the genitourinary systems.   BONES AND SOFT TISSUE: Dextroconvex thoracolumbar scoliosis. Activity along the resected segment of the right 4th rib maximum SUV 3.9 (formerly 1.4 in this vicinity). Substantial pannus posterior to the odontoid with associated erosion of the posterior odontoid better shown on the dedicated cervical spine CT from 08/10/2024.   IMPRESSION: 1. New hypermetabolic 1.0 cm node or nodule inferior to the parotid gland adjacent to the external jugular vein, SUV max 10.0, new compared to prior study. 2. Right kidney lower pole mass corresponding to known renal cell carcinoma, SUV  max 5.0, measuring approximately 4.4 cm. 3. Moderate to large right pleural effusion with associated volume loss in the right middle and lower lobes. 4. Atherosclerosis. 5. Pleural thickening and calcification at the left lung apex and adjacent upper mediastinum, SUV max 6.0, stable compared to 12/25/20. Other left pleural plaques with similar low-grade metabolic activity as 2022. Increased activity along the resected segment of the right 4th rib, SUV max 3.9, increased from 1.4. Low-grade activity with pleural thickening at the right lung base, SUV max 4.0. Surveillance of these pleural lesions suggested. 6. Emphysema. 7. Dextroconvex thoracolumbar scoliosis.  LABS:      Latest Ref Rng & Units 11/04/2024   10:05 AM 10/07/2024    9:57 AM 09/09/2024   10:42 AM  CBC  WBC 4.0 - 10.5 K/uL 6.1  5.9  7.2   Hemoglobin 12.0 - 15.0 g/dL 87.1  86.8  86.8   Hematocrit 36.0 - 46.0 % 38.4  39.6  39.2   Platelets 150 - 400 K/uL 189  202  205       Latest Ref Rng &  Units 11/04/2024   10:05 AM 10/07/2024    9:57 AM 09/09/2024   10:42 AM  CMP  Glucose 70 - 99 mg/dL 98  865  889   BUN 8 - 23 mg/dL 10  12  9    Creatinine 0.44 - 1.00 mg/dL 9.31  9.27  9.33   Sodium 135 - 145 mmol/L 141  141  140   Potassium 3.5 - 5.1 mmol/L 4.3  3.7  4.0   Chloride 98 - 111 mmol/L 105  105  104   CO2 22 - 32 mmol/L 26  24  23    Calcium 8.9 - 10.3 mg/dL 8.9  9.1  9.0   Total Protein 6.5 - 8.1 g/dL 6.8  6.8  7.0   Total Bilirubin 0.0 - 1.2 mg/dL 0.4  0.4  0.4   Alkaline Phos 38 - 126 U/L 67  65  78   AST 15 - 41 U/L 35  28  30   ALT 0 - 44 U/L 25  19  19     ASSESSMENT & PLAN:  Assessment/Plan:  A 73 y.o. female with metastatic renal cell carcinoma.  The patient will proceed with her 47th cycle of maintenance nivolumab  immunotherapy today.  Of note, the lesion palpated in her right cervical area appears to be smaller versus previously.  Nevertheless, as it is still firm, it warrants a biopsy to ensure it is not a  neoplastic process.  Overall, the patient is doing well.  I will see her back in 4 weeks before she heads into her 48th cycle of maintenance nivolumab . The patient understands all the plans discussed today and is in agreement with them.    Lynette Noah DELENA Kerns, MD

## 2024-11-04 ENCOUNTER — Other Ambulatory Visit: Payer: Self-pay

## 2024-11-04 ENCOUNTER — Other Ambulatory Visit: Payer: Self-pay | Admitting: Oncology

## 2024-11-04 ENCOUNTER — Inpatient Hospital Stay (HOSPITAL_BASED_OUTPATIENT_CLINIC_OR_DEPARTMENT_OTHER): Admitting: Oncology

## 2024-11-04 ENCOUNTER — Encounter: Payer: Self-pay | Admitting: Oncology

## 2024-11-04 ENCOUNTER — Inpatient Hospital Stay

## 2024-11-04 ENCOUNTER — Inpatient Hospital Stay: Attending: Oncology

## 2024-11-04 VITALS — BP 156/55 | HR 67 | Temp 98.4°F | Resp 16 | Ht 61.0 in | Wt 134.4 lb

## 2024-11-04 VITALS — BP 156/55 | HR 67 | Temp 98.4°F | Resp 18 | Ht 61.0 in | Wt 134.0 lb

## 2024-11-04 DIAGNOSIS — D3001 Benign neoplasm of right kidney: Secondary | ICD-10-CM

## 2024-11-04 DIAGNOSIS — Z79620 Long term (current) use of immunosuppressive biologic: Secondary | ICD-10-CM | POA: Insufficient documentation

## 2024-11-04 DIAGNOSIS — C641 Malignant neoplasm of right kidney, except renal pelvis: Secondary | ICD-10-CM

## 2024-11-04 DIAGNOSIS — Z5112 Encounter for antineoplastic immunotherapy: Secondary | ICD-10-CM | POA: Insufficient documentation

## 2024-11-04 LAB — CMP (CANCER CENTER ONLY)
ALT: 25 U/L (ref 0–44)
AST: 35 U/L (ref 15–41)
Albumin: 4.3 g/dL (ref 3.5–5.0)
Alkaline Phosphatase: 67 U/L (ref 38–126)
Anion gap: 11 (ref 5–15)
BUN: 10 mg/dL (ref 8–23)
CO2: 26 mmol/L (ref 22–32)
Calcium: 8.9 mg/dL (ref 8.9–10.3)
Chloride: 105 mmol/L (ref 98–111)
Creatinine: 0.68 mg/dL (ref 0.44–1.00)
GFR, Estimated: >60 mL/min (ref >60)
Glucose, Bld: 98 mg/dL (ref 70–99)
Potassium: 4.3 mmol/L (ref 3.5–5.1)
Sodium: 141 mmol/L (ref 135–145)
Total Bilirubin: 0.4 mg/dL (ref 0.0–1.2)
Total Protein: 6.8 g/dL (ref 6.5–8.1)

## 2024-11-04 LAB — CBC WITH DIFFERENTIAL (CANCER CENTER ONLY)
Abs Immature Granulocytes: 0.02 K/uL (ref 0.00–0.07)
Basophils Absolute: 0.1 K/uL (ref 0.0–0.1)
Basophils Relative: 1 %
Eosinophils Absolute: 0.4 K/uL (ref 0.0–0.5)
Eosinophils Relative: 6 %
HCT: 38.4 % (ref 36.0–46.0)
Hemoglobin: 12.8 g/dL (ref 12.0–15.0)
Immature Granulocytes: 0 %
Lymphocytes Relative: 21 %
Lymphs Abs: 1.3 K/uL (ref 0.7–4.0)
MCH: 32.2 pg (ref 26.0–34.0)
MCHC: 33.3 g/dL (ref 30.0–36.0)
MCV: 96.7 fL (ref 80.0–100.0)
Monocytes Absolute: 0.6 K/uL (ref 0.1–1.0)
Monocytes Relative: 9 %
Neutro Abs: 3.8 K/uL (ref 1.7–7.7)
Neutrophils Relative %: 63 %
Platelet Count: 189 K/uL (ref 150–400)
RBC: 3.97 MIL/uL (ref 3.87–5.11)
RDW: 13.2 % (ref 11.5–15.5)
WBC Count: 6.1 K/uL (ref 4.0–10.5)
nRBC: 0 % (ref 0.0–0.2)

## 2024-11-04 LAB — TSH: TSH: 1.88 u[IU]/mL (ref 0.350–4.500)

## 2024-11-04 MED ORDER — SODIUM CHLORIDE 0.9 % IV SOLN
480.0000 mg | Freq: Once | INTRAVENOUS | Status: AC
  Administered 2024-11-04: 480 mg via INTRAVENOUS
  Filled 2024-11-04: qty 48

## 2024-11-04 MED ORDER — SODIUM CHLORIDE 0.9 % IV SOLN: Freq: Once | INTRAVENOUS | Status: AC

## 2024-11-04 NOTE — Patient Instructions (Signed)
 Nivolumab Injection What is this medication? NIVOLUMAB (nye VOL ue mab) treats some types of cancer. It works by helping your immune system slow or stop the spread of cancer cells. It is a monoclonal antibody. This medicine may be used for other purposes; ask your health care provider or pharmacist if you have questions. COMMON BRAND NAME(S): Opdivo What should I tell my care team before I take this medication? They need to know if you have any of these conditions: Allogeneic stem cell transplant (uses someone else's stem cells) Autoimmune diseases, such as Crohn disease, ulcerative colitis, lupus History of chest radiation Nervous system problems, such as Guillain-Barre syndrome or myasthenia gravis Organ transplant An unusual or allergic reaction to nivolumab, other medications, foods, dyes, or preservatives Pregnant or trying to get pregnant Breast-feeding How should I use this medication? This medication is infused into a vein. It is given in a hospital or clinic setting. A special MedGuide will be given to you before each treatment. Be sure to read this information carefully each time. Talk to your care team about the use of this medication in children. While it may be prescribed for children as young as 12 years for selected conditions, precautions do apply. Overdosage: If you think you have taken too much of this medicine contact a poison control center or emergency room at once. NOTE: This medicine is only for you. Do not share this medicine with others. What if I miss a dose? Keep appointments for follow-up doses. It is important not to miss your dose. Call your care team if you are unable to keep an appointment. What may interact with this medication? Interactions have not been studied. This list may not describe all possible interactions. Give your health care provider a list of all the medicines, herbs, non-prescription drugs, or dietary supplements you use. Also tell them if you  smoke, drink alcohol, or use illegal drugs. Some items may interact with your medicine. What should I watch for while using this medication? Your condition will be monitored carefully while you are receiving this medication. You may need blood work while taking this medication. This medication may cause serious skin reactions. They can happen weeks to months after starting the medication. Contact your care team right away if you notice fevers or flu-like symptoms with a rash. The rash may be red or purple and then turn into blisters or peeling of the skin. You may also notice a red rash with swelling of the face, lips, or lymph nodes in your neck or under your arms. Tell your care team right away if you have any change in your eyesight. Talk to your care team if you are pregnant or think you might be pregnant. A negative pregnancy test is required before starting this medication. A reliable form of contraception is recommended while taking this medication and for 5 months after the last dose. Talk to your care team about effective forms of contraception. Do not breast-feed while taking this medication and for 5 months after the last dose. What side effects may I notice from receiving this medication? Side effects that you should report to your care team as soon as possible: Allergic reactions--skin rash, itching, hives, swelling of the face, lips, tongue, or throat Dry cough, shortness of breath or trouble breathing Eye pain, redness, irritation, or discharge with blurry or decreased vision Heart muscle inflammation--unusual weakness or fatigue, shortness of breath, chest pain, fast or irregular heartbeat, dizziness, swelling of the ankles, feet, or hands Hormone  gland problems--headache, sensitivity to light, unusual weakness or fatigue, dizziness, fast or irregular heartbeat, increased sensitivity to cold or heat, excessive sweating, constipation, hair loss, increased thirst or amount of urine,  tremors or shaking, irritability Infusion reactions--chest pain, shortness of breath or trouble breathing, feeling faint or lightheaded Kidney injury (glomerulonephritis)--decrease in the amount of urine, red or dark brown urine, foamy or bubbly urine, swelling of the ankles, hands, or feet Liver injury--right upper belly pain, loss of appetite, nausea, light-colored stool, dark yellow or brown urine, yellowing skin or eyes, unusual weakness or fatigue Pain, tingling, or numbness in the hands or feet, muscle weakness, change in vision, confusion or trouble speaking, loss of balance or coordination, trouble walking, seizures Rash, fever, and swollen lymph nodes Redness, blistering, peeling, or loosening of the skin, including inside the mouth Sudden or severe stomach pain, bloody diarrhea, fever, nausea, vomiting Side effects that usually do not require medical attention (report these to your care team if they continue or are bothersome): Bone, joint, or muscle pain Diarrhea Fatigue Loss of appetite Nausea Skin rash This list may not describe all possible side effects. Call your doctor for medical advice about side effects. You may report side effects to FDA at 1-800-FDA-1088. Where should I keep my medication? This medication is given in a hospital or clinic. It will not be stored at home. NOTE: This sheet is a summary. It may not cover all possible information. If you have questions about this medicine, talk to your doctor, pharmacist, or health care provider.  2024 Elsevier/Gold Standard (2022-04-08 00:00:00)

## 2024-11-05 ENCOUNTER — Other Ambulatory Visit: Payer: Self-pay

## 2024-11-05 LAB — T4: T4, Total: 5.5 ug/dL (ref 4.5–12.0)

## 2024-11-29 DIAGNOSIS — J301 Allergic rhinitis due to pollen: Secondary | ICD-10-CM | POA: Diagnosis not present

## 2024-11-29 NOTE — Progress Notes (Signed)
 I acknowledge supervision of allergy services. This encounter and charge are for mixing only at the [facility] office.

## 2024-12-01 NOTE — Progress Notes (Signed)
 [facility] at [facility] [address] [place],  [place]  [address] [phone]  Clinic Day:  12/02/2024  Referring physician: Fernand Tracey LABOR, MD  HISTORY OF PRESENT ILLNESS:  The patient is a 73 y.o. female with metastatic renal cell carcinoma, proven per cytology from a right-sided thoracentesis in early December 2021.  She comes in today to be evaluated before heading into her 48th cycle of maintenance nivolumab .  She also comes in today to go over her right salivary gland biopsy results and their implications.  The patient had a PET scan in October 2025 which showed a hypermetabolic  node/mass in her right upper cervical region which was concerning for some type of malignant process.  This scan was ordered after her physical exam was pertinent for this lesion being palpable.  The patient denies that the area is causing her any particular problems.    With respect to her metastatic renal cell carcinoma treatment, this patient's maintenance nivolumab  was preceded by 3 cycles of nivolumab /ipilimumab .  A 4th cycle was not given due to her being hospitalized for severe colitis related to the ipilimumab  component of her combination immunotherapy.     PHYSICAL EXAM:  Blood pressure (!) 152/92, pulse 69, temperature (!) 97.4 F (36.3 C), temperature source Oral, resp. rate 16, height 5' 1 (1.549 m), weight 134 lb 14.4 oz (61.2 kg), SpO2 96%. Wt Readings from Last 3 Encounters:  12/02/24 134 lb 14.4 oz (61.2 kg)  12/02/24 134 lb 14.4 oz (61.2 kg)  11/04/24 134 lb (60.8 kg)   Body mass index is 25.49 kg/m. Performance status (ECOG): 1 - Symptomatic but completely ambulatory Physical Exam Constitutional:      Appearance: Normal appearance. She is not ill-appearing.  HENT:     Mouth/Throat:     Mouth: Mucous membranes are moist.     Pharynx: Oropharynx is clear. No oropharyngeal exudate or posterior oropharyngeal erythema.  Cardiovascular:     Rate and Rhythm: Normal  rate and regular rhythm.     Heart sounds: No murmur heard.    No friction rub. No gallop.  Pulmonary:     Effort: Pulmonary effort is normal. No respiratory distress.     Breath sounds: Examination of the right-lower field reveals decreased breath sounds. Decreased breath sounds present. No wheezing, rhonchi or rales.  Abdominal:     General: Bowel sounds are normal. There is no distension.     Palpations: Abdomen is soft. There is no mass.     Tenderness: There is no abdominal tenderness.  Musculoskeletal:        General: No swelling.     Right lower leg: No edema.     Left lower leg: No edema.  Lymphadenopathy:     Cervical: Cervical adenopathy present.     Right cervical: Superficial cervical adenopathy (Still present and firm, but smaller in size at 1-2 cm) present.     Upper Body:     Right upper body: No supraclavicular or axillary adenopathy.     Left upper body: No supraclavicular or axillary adenopathy.     Lower Body: No right inguinal adenopathy. No left inguinal adenopathy.  Skin:    General: Skin is warm.     Coloration: Skin is not jaundiced.     Findings: No lesion or rash.  Neurological:     General: No focal deficit present.     Mental Status: She is alert and oriented to person, place, and time. Mental status is at  baseline.  Psychiatric:        Mood and Affect: Mood normal.        Behavior: Behavior normal.        Thought Content: Thought content normal.   PATHOLOGY:  Her right cervical lymph node biopsy on 11-23-24 revealed the following:   SCANS:  Her PET scan done on 10-18-24 revealed the following: FINDINGS:   HEAD AND NECK: Lymph node or nodule along the inferior margin of the parotid gland and adjacent to the external jugular vein has a short axis diameter of 1.0 cm on image 35 series 4 with maximum SUV 10.0. This is new compared to the previous PET CT. Substantial pannus posterior to the odontoid with associated erosion of the posterior odontoid  better shown on the dedicated cervical spine CT from 08/10/2024. Bilateral common carotid atheromatous vascular calcification.   CHEST: Pleural thickening and calcification at the left lung apex and extending over the adjacent upper mediastinum, maximum SUV 6.0 (formerly 5.7). No morphologic change from 12/25/2020. Other pleural plaques on the left likewise demonstrate low grade metabolic activity similar to previous 2022 exam. Activity along the resected segment of the right 4th rib maximum SUV 3.9 (formerly 1.4 in this vicinity). Low grade activity along with pleural thickening posterolaterally along the right lung base with maximum SUV 4.0. Moderate to large right pleural effusion. Volume loss in the right middle lobe and right lower lobe. Wedge resection staple line at the right lung apex and at the left lung apex. Emphysema. Right port-a-cath tip in the lower SVC. Aortic, branch vessel, coronary, and systemic atherosclerosis. No metabolically active pulmonary nodules. No metabolically active lymphadenopathy.   ABDOMEN AND PELVIS: Representative section of the right kidney lower pole mass with a maximum SUV of 5.0 corresponding to the known renal cell carcinoma. This mass measures about 4.4 cm in diameter. Prominence of stool throughout the colon, favoring constipation. Small but abnormal amount of free pelvic fluid. No metabolically active lymphadenopathy. Physiologic activity within the genitourinary systems.   BONES AND SOFT TISSUE: Dextroconvex thoracolumbar scoliosis. Activity along the resected segment of the right 4th rib maximum SUV 3.9 (formerly 1.4 in this vicinity). Substantial pannus posterior to the odontoid with associated erosion of the posterior odontoid better shown on the dedicated cervical spine CT from 08/10/2024.   IMPRESSION: 1. New hypermetabolic 1.0 cm node or nodule inferior to the parotid gland adjacent to the external jugular vein, SUV max 10.0, new  compared to prior study. 2. Right kidney lower pole mass corresponding to known renal cell carcinoma, SUV max 5.0, measuring approximately 4.4 cm. 3. Moderate to large right pleural effusion with associated volume loss in the right middle and lower lobes. 4. Atherosclerosis. 5. Pleural thickening and calcification at the left lung apex and adjacent upper mediastinum, SUV max 6.0, stable compared to 12/25/20. Other left pleural plaques with similar low-grade metabolic activity as 2022. Increased activity along the resected segment of the right 4th rib, SUV max 3.9, increased from 1.4. Low-grade activity with pleural thickening at the right lung base, SUV max 4.0. Surveillance of these pleural lesions suggested. 6. Emphysema. 7. Dextroconvex thoracolumbar scoliosis.  LABS:      Latest Ref Rng & Units 12/02/2024    9:47 AM 11/04/2024   10:05 AM 10/07/2024    9:57 AM  CBC  WBC 4.0 - 10.5 K/uL 6.0  6.1  5.9   Hemoglobin 12.0 - 15.0 g/dL 87.2  87.1  86.8   Hematocrit 36.0 - 46.0 % 38.5  38.4  39.6   Platelets 150 - 400 K/uL 210  189  202       Latest Ref Rng & Units 12/02/2024    9:47 AM 11/04/2024   10:05 AM 10/07/2024    9:57 AM  CMP  Glucose 70 - 99 mg/dL 861  98  865   BUN 8 - 23 mg/dL 15  10  12    Creatinine 0.44 - 1.00 mg/dL 9.33  9.31  9.27   Sodium 135 - 145 mmol/L 138  141  141   Potassium 3.5 - 5.1 mmol/L 4.0  4.3  3.7   Chloride 98 - 111 mmol/L 103  105  105   CO2 22 - 32 mmol/L 24  26  24    Calcium 8.9 - 10.3 mg/dL 8.9  8.9  9.1   Total Protein 6.5 - 8.1 g/dL 6.6  6.8  6.8   Total Bilirubin 0.0 - 1.2 mg/dL 0.4  0.4  0.4   Alkaline Phos 38 - 126 U/L 72  67  65   AST 15 - 41 U/L 32  35  28   ALT 0 - 44 U/L 21  25  19     ASSESSMENT & PLAN:  Assessment/Plan:  A 73 y.o. female with metastatic renal cell carcinoma.  In clinic today, I went over her biopsy results with her, who pathology definitely believes is a metastatic lesion related to her renal cell carcinoma.   Although this lesion developed while on her immunotherapy, over the past few months, it has gotten smaller without any local intervention.  For now, the patient will continue taking her nivolumab .  I would consider adding an oral TKI agent in the future if there are signs of disease progression, either locally or elsewhere.  Her previous PET scan did not show this to be the case.  For now, the patient will proceed with her 48th cycle of maintenance nivolumab  immunotherapy today.  Overall, the patient is still doing well.  I will see her back in 4 weeks before she heads into her 49th cycle of maintenance nivolumab . The patient understands all the plans discussed today and is in agreement with them.    Skyelyn Scruggs DELENA Kerns, MD

## 2024-12-02 ENCOUNTER — Inpatient Hospital Stay: Attending: Oncology

## 2024-12-02 ENCOUNTER — Telehealth: Payer: Self-pay

## 2024-12-02 ENCOUNTER — Other Ambulatory Visit: Payer: Self-pay

## 2024-12-02 ENCOUNTER — Inpatient Hospital Stay: Attending: Oncology | Admitting: Oncology

## 2024-12-02 ENCOUNTER — Inpatient Hospital Stay

## 2024-12-02 VITALS — BP 160/68 | HR 66 | Temp 97.5°F | Resp 18 | Ht 61.0 in | Wt 134.9 lb

## 2024-12-02 VITALS — BP 152/92 | HR 69 | Temp 97.4°F | Resp 16 | Ht 61.0 in | Wt 134.9 lb

## 2024-12-02 DIAGNOSIS — C641 Malignant neoplasm of right kidney, except renal pelvis: Secondary | ICD-10-CM

## 2024-12-02 DIAGNOSIS — D3001 Benign neoplasm of right kidney: Secondary | ICD-10-CM

## 2024-12-02 DIAGNOSIS — Z79620 Long term (current) use of immunosuppressive biologic: Secondary | ICD-10-CM | POA: Diagnosis not present

## 2024-12-02 DIAGNOSIS — Z5112 Encounter for antineoplastic immunotherapy: Secondary | ICD-10-CM | POA: Diagnosis present

## 2024-12-02 LAB — CBC WITH DIFFERENTIAL (CANCER CENTER ONLY)
Abs Immature Granulocytes: 0.02 K/uL (ref 0.00–0.07)
Basophils Absolute: 0 K/uL (ref 0.0–0.1)
Basophils Relative: 1 %
Eosinophils Absolute: 0.4 K/uL (ref 0.0–0.5)
Eosinophils Relative: 6 %
HCT: 38.5 % (ref 36.0–46.0)
Hemoglobin: 12.7 g/dL (ref 12.0–15.0)
Immature Granulocytes: 0 %
Lymphocytes Relative: 22 %
Lymphs Abs: 1.3 K/uL (ref 0.7–4.0)
MCH: 32.6 pg (ref 26.0–34.0)
MCHC: 33 g/dL (ref 30.0–36.0)
MCV: 98.7 fL (ref 80.0–100.0)
Monocytes Absolute: 0.6 K/uL (ref 0.1–1.0)
Monocytes Relative: 10 %
Neutro Abs: 3.7 K/uL (ref 1.7–7.7)
Neutrophils Relative %: 61 %
Platelet Count: 210 K/uL (ref 150–400)
RBC: 3.9 MIL/uL (ref 3.87–5.11)
RDW: 14 % (ref 11.5–15.5)
WBC Count: 6 K/uL (ref 4.0–10.5)
nRBC: 0 % (ref 0.0–0.2)

## 2024-12-02 LAB — CMP (CANCER CENTER ONLY)
ALT: 21 U/L (ref 0–44)
AST: 32 U/L (ref 15–41)
Albumin: 4.5 g/dL (ref 3.5–5.0)
Alkaline Phosphatase: 72 U/L (ref 38–126)
Anion gap: 11 (ref 5–15)
BUN: 15 mg/dL (ref 8–23)
CO2: 24 mmol/L (ref 22–32)
Calcium: 8.9 mg/dL (ref 8.9–10.3)
Chloride: 103 mmol/L (ref 98–111)
Creatinine: 0.66 mg/dL (ref 0.44–1.00)
GFR, Estimated: >60 mL/min (ref >60)
Glucose, Bld: 138 mg/dL — ABNORMAL HIGH (ref 70–99)
Potassium: 4 mmol/L (ref 3.5–5.1)
Sodium: 138 mmol/L (ref 135–145)
Total Bilirubin: 0.4 mg/dL (ref 0.0–1.2)
Total Protein: 6.6 g/dL (ref 6.5–8.1)

## 2024-12-02 LAB — TSH: TSH: 1.89 u[IU]/mL (ref 0.350–4.500)

## 2024-12-02 MED ORDER — SODIUM CHLORIDE 0.9 % IV SOLN
480.0000 mg | Freq: Once | INTRAVENOUS | Status: AC
  Administered 2024-12-02: 480 mg via INTRAVENOUS
  Filled 2024-12-02: qty 48

## 2024-12-02 NOTE — Progress Notes (Signed)
 The proposed treatment discussed in conference is for discussion purpose only and is not a binding recommendation.  The patients have not been physically examined, or presented with their treatment options.  Therefore, final treatment plans cannot be decided.

## 2024-12-02 NOTE — Telephone Encounter (Signed)
 [facility]  [name] [name] will be presented on the [facility] tumor board today. [name] [name] has a personal history of Kidney Cancer.  [name] [name] meets National Comprehensive Cancer Network (NCCN) criteria for genetic testing for Hereditary Breast and Ovarian Cancer Syndrome based on her personal history of and pancreatic cancer in her mother. She also has a family history of breast cancer and melanoma in her mother.  [name] Fryer, MS, CGC  Certified Genetic Counselor  Email: [name]  Phone: [phone]-

## 2024-12-02 NOTE — Patient Instructions (Signed)
 Nivolumab Injection What is this medication? NIVOLUMAB (nye VOL ue mab) treats some types of cancer. It works by helping your immune system slow or stop the spread of cancer cells. It is a monoclonal antibody. This medicine may be used for other purposes; ask your health care provider or pharmacist if you have questions. COMMON BRAND NAME(S): Opdivo What should I tell my care team before I take this medication? They need to know if you have any of these conditions: Allogeneic stem cell transplant (uses someone else's stem cells) Autoimmune diseases, such as Crohn disease, ulcerative colitis, lupus History of chest radiation Nervous system problems, such as Guillain-Barre syndrome or myasthenia gravis Organ transplant An unusual or allergic reaction to nivolumab, other medications, foods, dyes, or preservatives Pregnant or trying to get pregnant Breast-feeding How should I use this medication? This medication is infused into a vein. It is given in a hospital or clinic setting. A special MedGuide will be given to you before each treatment. Be sure to read this information carefully each time. Talk to your care team about the use of this medication in children. While it may be prescribed for children as young as 12 years for selected conditions, precautions do apply. Overdosage: If you think you have taken too much of this medicine contact a poison control center or emergency room at once. NOTE: This medicine is only for you. Do not share this medicine with others. What if I miss a dose? Keep appointments for follow-up doses. It is important not to miss your dose. Call your care team if you are unable to keep an appointment. What may interact with this medication? Interactions have not been studied. This list may not describe all possible interactions. Give your health care provider a list of all the medicines, herbs, non-prescription drugs, or dietary supplements you use. Also tell them if you  smoke, drink alcohol, or use illegal drugs. Some items may interact with your medicine. What should I watch for while using this medication? Your condition will be monitored carefully while you are receiving this medication. You may need blood work while taking this medication. This medication may cause serious skin reactions. They can happen weeks to months after starting the medication. Contact your care team right away if you notice fevers or flu-like symptoms with a rash. The rash may be red or purple and then turn into blisters or peeling of the skin. You may also notice a red rash with swelling of the face, lips, or lymph nodes in your neck or under your arms. Tell your care team right away if you have any change in your eyesight. Talk to your care team if you are pregnant or think you might be pregnant. A negative pregnancy test is required before starting this medication. A reliable form of contraception is recommended while taking this medication and for 5 months after the last dose. Talk to your care team about effective forms of contraception. Do not breast-feed while taking this medication and for 5 months after the last dose. What side effects may I notice from receiving this medication? Side effects that you should report to your care team as soon as possible: Allergic reactions--skin rash, itching, hives, swelling of the face, lips, tongue, or throat Dry cough, shortness of breath or trouble breathing Eye pain, redness, irritation, or discharge with blurry or decreased vision Heart muscle inflammation--unusual weakness or fatigue, shortness of breath, chest pain, fast or irregular heartbeat, dizziness, swelling of the ankles, feet, or hands Hormone  gland problems--headache, sensitivity to light, unusual weakness or fatigue, dizziness, fast or irregular heartbeat, increased sensitivity to cold or heat, excessive sweating, constipation, hair loss, increased thirst or amount of urine,  tremors or shaking, irritability Infusion reactions--chest pain, shortness of breath or trouble breathing, feeling faint or lightheaded Kidney injury (glomerulonephritis)--decrease in the amount of urine, red or dark brown urine, foamy or bubbly urine, swelling of the ankles, hands, or feet Liver injury--right upper belly pain, loss of appetite, nausea, light-colored stool, dark yellow or brown urine, yellowing skin or eyes, unusual weakness or fatigue Pain, tingling, or numbness in the hands or feet, muscle weakness, change in vision, confusion or trouble speaking, loss of balance or coordination, trouble walking, seizures Rash, fever, and swollen lymph nodes Redness, blistering, peeling, or loosening of the skin, including inside the mouth Sudden or severe stomach pain, bloody diarrhea, fever, nausea, vomiting Side effects that usually do not require medical attention (report these to your care team if they continue or are bothersome): Bone, joint, or muscle pain Diarrhea Fatigue Loss of appetite Nausea Skin rash This list may not describe all possible side effects. Call your doctor for medical advice about side effects. You may report side effects to FDA at 1-800-FDA-1088. Where should I keep my medication? This medication is given in a hospital or clinic. It will not be stored at home. NOTE: This sheet is a summary. It may not cover all possible information. If you have questions about this medicine, talk to your doctor, pharmacist, or health care provider.  2024 Elsevier/Gold Standard (2022-04-08 00:00:00)  [facility] [place] - [facility] DEPT OF [facility]  Discharge Instructions: Thank you for choosing [facility] to provide your oncology and hematology care.  If you have a lab appointment with the Cancer Center, please go directly to the Cancer Center and check in at the registration area.   Wear comfortable clothing and clothing appropriate for easy  access to any Portacath or PICC line.   We strive to give you quality time with your provider. You may need to reschedule your appointment if you arrive late (15 or more minutes).  Arriving late affects you and other patients whose appointments are after yours.  Also, if you miss three or more appointments without notifying the office, you may be dismissed from the clinic at the provider's discretion.      For prescription refill requests, have your pharmacy contact our office and allow 72 hours for refills to be completed.    Today you received the following chemotherapy and/or immunotherapy agents nivolumab      To help prevent nausea and vomiting after your treatment, we encourage you to take your nausea medication as directed.  BELOW ARE SYMPTOMS THAT SHOULD BE REPORTED IMMEDIATELY: *FEVER GREATER THAN 100.4 F (38 C) OR HIGHER *CHILLS OR SWEATING *NAUSEA AND VOMITING THAT IS NOT CONTROLLED WITH YOUR NAUSEA MEDICATION *UNUSUAL SHORTNESS OF BREATH *UNUSUAL BRUISING OR BLEEDING *URINARY PROBLEMS (pain or burning when urinating, or frequent urination) *BOWEL PROBLEMS (unusual diarrhea, constipation, pain near the anus) TENDERNESS IN MOUTH AND THROAT WITH OR WITHOUT PRESENCE OF ULCERS (sore throat, sores in mouth, or a toothache) UNUSUAL RASH, SWELLING OR PAIN  UNUSUAL VAGINAL DISCHARGE OR ITCHING   Items with * indicate a potential emergency and should be followed up as soon as possible or go to the Emergency Department if any problems should occur.  Please show the CHEMOTHERAPY ALERT CARD or IMMUNOTHERAPY ALERT CARD at check-in  to the Emergency Department and triage nurse.  Should you have questions after your visit or need to cancel or reschedule your appointment, please contact [facility] [place] [facility]  Dept: [phone]  and follow the prompts.  Office hours are 8:00 a.m. to 4:30 p.m. Monday - Friday. Please note that voicemails left after  4:00 p.m. may not be returned until the following business day.  We are closed weekends and major holidays. You have access to a nurse at all times for urgent questions. Please call the main number to the clinic Dept: [phone] and follow the prompts.  For any non-urgent questions, you may also contact your provider using MyChart. We now offer e-Visits for anyone 45 and older to request care online for non-urgent symptoms. For details visit [email or web address].   Also download the MyChart app! Go to the app store, search "MyChart", open the app, select [facility], and log in with your MyChart username and password.

## 2024-12-03 ENCOUNTER — Other Ambulatory Visit: Payer: Self-pay

## 2024-12-03 LAB — T4: T4, Total: 5.1 ug/dL (ref 4.5–12.0)

## 2024-12-20 ENCOUNTER — Encounter: Payer: Self-pay | Admitting: Oncology

## 2024-12-23 ENCOUNTER — Encounter: Payer: Self-pay | Admitting: Oncology

## 2024-12-23 ENCOUNTER — Encounter: Payer: Self-pay | Admitting: Hematology and Oncology

## 2024-12-29 NOTE — Progress Notes (Signed)
 " [facility] at [facility] [address] [place],  [place]  [address] [phone]  Clinic Day:  12/30/2024  Referring physician: Ezzard Valaria LABOR, MD  HISTORY OF PRESENT ILLNESS:  The patient is a 74 y.o. female with metastatic renal cell carcinoma, proven per cytology from a right-sided thoracentesis in early December 2021.  She comes in today to be evaluated before heading into her 49th cycle of maintenance nivolumab .  Of note, a recent right salivary gland biopsy suggested metastatic renal cell cancer to this area.  However, over these past few months, this area has progressively dissipated to where she no longer notices this lesion.  This area progressively dissipated despite not making any changes in her therapy, which has been single-agent nivolumab .  Since her last visit, the patient has been doing fairly well.  She denies having other new symptoms or findings which concern her for overt signs of disease progression.  With respect to her metastatic renal cell carcinoma treatment, this patient's maintenance nivolumab  was preceded by 3 cycles of nivolumab /ipilimumab .  A 4th cycle was not given due to her being hospitalized for severe colitis related to the ipilimumab  component of her combination immunotherapy.     PHYSICAL EXAM:  Blood pressure (!) 157/59, pulse 62, temperature 97.9 F (36.6 C), temperature source Oral, resp. rate 16, height 5' 1 (1.549 m), weight 136 lb 3.2 oz (61.8 kg), SpO2 96%. Wt Readings from Last 3 Encounters:  12/30/24 136 lb 3.2 oz (61.8 kg)  12/02/24 134 lb 14.4 oz (61.2 kg)  12/02/24 134 lb 14.4 oz (61.2 kg)   Body mass index is 25.73 kg/m. Performance status (ECOG): 1 - Symptomatic but completely ambulatory Physical Exam Constitutional:      Appearance: Normal appearance. She is not ill-appearing.  HENT:     Mouth/Throat:     Mouth: Mucous membranes are moist.     Pharynx: Oropharynx is clear. No oropharyngeal exudate or posterior  oropharyngeal erythema.  Cardiovascular:     Rate and Rhythm: Normal rate and regular rhythm.     Heart sounds: No murmur heard.    No friction rub. No gallop.  Pulmonary:     Effort: Pulmonary effort is normal. No respiratory distress.     Breath sounds: Examination of the right-lower field reveals decreased breath sounds. Decreased breath sounds present. No wheezing, rhonchi or rales.  Abdominal:     General: Bowel sounds are normal. There is no distension.     Palpations: Abdomen is soft. There is no mass.     Tenderness: There is no abdominal tenderness.  Musculoskeletal:        General: No swelling.     Right lower leg: No edema.     Left lower leg: No edema.  Lymphadenopathy:     Cervical: No cervical adenopathy.     Right cervical: No superficial (Lymph node in this area is no longer palpated) cervical adenopathy.    Upper Body:     Right upper body: No supraclavicular or axillary adenopathy.     Left upper body: No supraclavicular or axillary adenopathy.     Lower Body: No right inguinal adenopathy. No left inguinal adenopathy.  Skin:    General: Skin is warm.     Coloration: Skin is not jaundiced.     Findings: No lesion or rash.  Neurological:     General: No focal deficit present.     Mental Status: She is alert and oriented to person, place, and time.  Mental status is at baseline.  Psychiatric:        Mood and Affect: Mood normal.        Behavior: Behavior normal.        Thought Content: Thought content normal.   PATHOLOGY:  Her right cervical lymph node biopsy on 11-23-24 revealed the following:   SCANS:  Her PET scan done on 10-18-24 revealed the following: FINDINGS:   HEAD AND NECK: Lymph node or nodule along the inferior margin of the parotid gland and adjacent to the external jugular vein has a short axis diameter of 1.0 cm on image 35 series 4 with maximum SUV 10.0. This is new compared to the previous PET CT. Substantial pannus posterior to the odontoid  with associated erosion of the posterior odontoid better shown on the dedicated cervical spine CT from 08/10/2024. Bilateral common carotid atheromatous vascular calcification.   CHEST: Pleural thickening and calcification at the left lung apex and extending over the adjacent upper mediastinum, maximum SUV 6.0 (formerly 5.7). No morphologic change from 12/25/2020. Other pleural plaques on the left likewise demonstrate low grade metabolic activity similar to previous 2022 exam. Activity along the resected segment of the right 4th rib maximum SUV 3.9 (formerly 1.4 in this vicinity). Low grade activity along with pleural thickening posterolaterally along the right lung base with maximum SUV 4.0. Moderate to large right pleural effusion. Volume loss in the right middle lobe and right lower lobe. Wedge resection staple line at the right lung apex and at the left lung apex. Emphysema. Right port-a-cath tip in the lower SVC. Aortic, branch vessel, coronary, and systemic atherosclerosis. No metabolically active pulmonary nodules. No metabolically active lymphadenopathy.   ABDOMEN AND PELVIS: Representative section of the right kidney lower pole mass with a maximum SUV of 5.0 corresponding to the known renal cell carcinoma. This mass measures about 4.4 cm in diameter. Prominence of stool throughout the colon, favoring constipation. Small but abnormal amount of free pelvic fluid. No metabolically active lymphadenopathy. Physiologic activity within the genitourinary systems.   BONES AND SOFT TISSUE: Dextroconvex thoracolumbar scoliosis. Activity along the resected segment of the right 4th rib maximum SUV 3.9 (formerly 1.4 in this vicinity). Substantial pannus posterior to the odontoid with associated erosion of the posterior odontoid better shown on the dedicated cervical spine CT from 08/10/2024.   IMPRESSION: 1. New hypermetabolic 1.0 cm node or nodule inferior to the parotid gland adjacent  to the external jugular vein, SUV max 10.0, new compared to prior study. 2. Right kidney lower pole mass corresponding to known renal cell carcinoma, SUV max 5.0, measuring approximately 4.4 cm. 3. Moderate to large right pleural effusion with associated volume loss in the right middle and lower lobes. 4. Atherosclerosis. 5. Pleural thickening and calcification at the left lung apex and adjacent upper mediastinum, SUV max 6.0, stable compared to 12/25/20. Other left pleural plaques with similar low-grade metabolic activity as 2022. Increased activity along the resected segment of the right 4th rib, SUV max 3.9, increased from 1.4. Low-grade activity with pleural thickening at the right lung base, SUV max 4.0. Surveillance of these pleural lesions suggested. 6. Emphysema. 7. Dextroconvex thoracolumbar scoliosis.  LABS:      Latest Ref Rng & Units 12/30/2024   10:18 AM 12/02/2024    9:47 AM 11/04/2024   10:05 AM  CBC  WBC 4.0 - 10.5 K/uL 5.7  6.0  6.1   Hemoglobin 12.0 - 15.0 g/dL 87.5  87.2  87.1   Hematocrit 36.0 -  46.0 % 37.2  38.5  38.4   Platelets 150 - 400 K/uL 181  210  189       Latest Ref Rng & Units 12/30/2024   10:18 AM 12/02/2024    9:47 AM 11/04/2024   10:05 AM  CMP  Glucose 70 - 99 mg/dL 852  861  98   BUN 8 - 23 mg/dL 16  15  10    Creatinine 0.44 - 1.00 mg/dL 9.29  9.33  9.31   Sodium 135 - 145 mmol/L 140  138  141   Potassium 3.5 - 5.1 mmol/L 4.2  4.0  4.3   Chloride 98 - 111 mmol/L 104  103  105   CO2 22 - 32 mmol/L 24  24  26    Calcium 8.9 - 10.3 mg/dL 9.1  8.9  8.9   Total Protein 6.5 - 8.1 g/dL 6.5  6.6  6.8   Total Bilirubin 0.0 - 1.2 mg/dL 0.3  0.4  0.4   Alkaline Phos 38 - 126 U/L 74  72  67   AST 15 - 41 U/L 28  32  35   ALT 0 - 44 U/L 18  21  25     ASSESSMENT & PLAN:  Assessment/Plan:  A 74 y.o. female with metastatic renal cell carcinoma.  She will proceed with her 49th cycle of maintenance nivolumab  immunotherapy today.  Overall, I am pleased that  her previous right cervical/carotid node has essentially dissipated over time without a change in therapy.  If this area regrows at this anytime or new areas of metastatic disease develop, the patient understands that I would likely add a VEGF TKI with her nivolumab  to hopefully ascertain a deeper response to therapy.  Otherwise, as she is doing well, I will see her back in 4 weeks before she heads into her 50th cycle of maintenance nivolumab . The patient understands all the plans discussed today and is in agreement with them.    Deleah Tison DELENA Kerns, MD       "

## 2024-12-30 ENCOUNTER — Inpatient Hospital Stay: Attending: Oncology | Admitting: Oncology

## 2024-12-30 ENCOUNTER — Inpatient Hospital Stay

## 2024-12-30 ENCOUNTER — Inpatient Hospital Stay: Attending: Oncology

## 2024-12-30 ENCOUNTER — Telehealth: Payer: Self-pay | Admitting: Oncology

## 2024-12-30 VITALS — BP 141/60 | HR 64 | Resp 18 | Ht 61.0 in

## 2024-12-30 VITALS — BP 157/59 | HR 62 | Temp 97.9°F | Resp 16 | Ht 61.0 in | Wt 136.2 lb

## 2024-12-30 DIAGNOSIS — Z5112 Encounter for antineoplastic immunotherapy: Secondary | ICD-10-CM | POA: Insufficient documentation

## 2024-12-30 DIAGNOSIS — J910 Malignant pleural effusion: Secondary | ICD-10-CM | POA: Insufficient documentation

## 2024-12-30 DIAGNOSIS — C641 Malignant neoplasm of right kidney, except renal pelvis: Secondary | ICD-10-CM | POA: Insufficient documentation

## 2024-12-30 DIAGNOSIS — Z79620 Long term (current) use of immunosuppressive biologic: Secondary | ICD-10-CM | POA: Diagnosis not present

## 2024-12-30 DIAGNOSIS — D3001 Benign neoplasm of right kidney: Secondary | ICD-10-CM

## 2024-12-30 LAB — CMP (CANCER CENTER ONLY)
ALT: 18 U/L (ref 0–44)
AST: 28 U/L (ref 15–41)
Albumin: 4.3 g/dL (ref 3.5–5.0)
Alkaline Phosphatase: 74 U/L (ref 38–126)
Anion gap: 12 (ref 5–15)
BUN: 16 mg/dL (ref 8–23)
CO2: 24 mmol/L (ref 22–32)
Calcium: 9.1 mg/dL (ref 8.9–10.3)
Chloride: 104 mmol/L (ref 98–111)
Creatinine: 0.7 mg/dL (ref 0.44–1.00)
GFR, Estimated: >60 mL/min
Glucose, Bld: 147 mg/dL — ABNORMAL HIGH (ref 70–99)
Potassium: 4.2 mmol/L (ref 3.5–5.1)
Sodium: 140 mmol/L (ref 135–145)
Total Bilirubin: 0.3 mg/dL (ref 0.0–1.2)
Total Protein: 6.5 g/dL (ref 6.5–8.1)

## 2024-12-30 LAB — CBC WITH DIFFERENTIAL (CANCER CENTER ONLY)
Abs Immature Granulocytes: 0.02 K/uL (ref 0.00–0.07)
Basophils Absolute: 0.1 K/uL (ref 0.0–0.1)
Basophils Relative: 1 %
Eosinophils Absolute: 0.3 K/uL (ref 0.0–0.5)
Eosinophils Relative: 5 %
HCT: 37.2 % (ref 36.0–46.0)
Hemoglobin: 12.4 g/dL (ref 12.0–15.0)
Immature Granulocytes: 0 %
Lymphocytes Relative: 19 %
Lymphs Abs: 1.1 K/uL (ref 0.7–4.0)
MCH: 32.9 pg (ref 26.0–34.0)
MCHC: 33.3 g/dL (ref 30.0–36.0)
MCV: 98.7 fL (ref 80.0–100.0)
Monocytes Absolute: 0.5 K/uL (ref 0.1–1.0)
Monocytes Relative: 10 %
Neutro Abs: 3.7 K/uL (ref 1.7–7.7)
Neutrophils Relative %: 65 %
Platelet Count: 181 K/uL (ref 150–400)
RBC: 3.77 MIL/uL — ABNORMAL LOW (ref 3.87–5.11)
RDW: 14 % (ref 11.5–15.5)
WBC Count: 5.7 K/uL (ref 4.0–10.5)
nRBC: 0 % (ref 0.0–0.2)

## 2024-12-30 LAB — TSH: TSH: 1.83 u[IU]/mL (ref 0.350–4.500)

## 2024-12-30 MED ORDER — SODIUM CHLORIDE 0.9 % IV SOLN: Freq: Once | INTRAVENOUS | Status: AC

## 2024-12-30 MED ORDER — SODIUM CHLORIDE 0.9 % IV SOLN
480.0000 mg | Freq: Once | INTRAVENOUS | Status: AC
  Administered 2024-12-30: 480 mg via INTRAVENOUS
  Filled 2024-12-30: qty 48

## 2024-12-30 NOTE — Telephone Encounter (Signed)
 Patient has been scheduled for follow-up visit per 12/30/2024 LOS.  Pt given an appt calendar with date and time.

## 2024-12-30 NOTE — Patient Instructions (Signed)
 Nivolumab Injection What is this medication? NIVOLUMAB (nye VOL ue mab) treats some types of cancer. It works by helping your immune system slow or stop the spread of cancer cells. It is a monoclonal antibody. This medicine may be used for other purposes; ask your health care provider or pharmacist if you have questions. COMMON BRAND NAME(S): Opdivo What should I tell my care team before I take this medication? They need to know if you have any of these conditions: Allogeneic stem cell transplant (uses someone else's stem cells) Autoimmune diseases, such as Crohn disease, ulcerative colitis, lupus History of chest radiation Nervous system problems, such as Guillain-Barre syndrome or myasthenia gravis Organ transplant An unusual or allergic reaction to nivolumab, other medications, foods, dyes, or preservatives Pregnant or trying to get pregnant Breast-feeding How should I use this medication? This medication is infused into a vein. It is given in a hospital or clinic setting. A special MedGuide will be given to you before each treatment. Be sure to read this information carefully each time. Talk to your care team about the use of this medication in children. While it may be prescribed for children as young as 12 years for selected conditions, precautions do apply. Overdosage: If you think you have taken too much of this medicine contact a poison control center or emergency room at once. NOTE: This medicine is only for you. Do not share this medicine with others. What if I miss a dose? Keep appointments for follow-up doses. It is important not to miss your dose. Call your care team if you are unable to keep an appointment. What may interact with this medication? Interactions have not been studied. This list may not describe all possible interactions. Give your health care provider a list of all the medicines, herbs, non-prescription drugs, or dietary supplements you use. Also tell them if you  smoke, drink alcohol, or use illegal drugs. Some items may interact with your medicine. What should I watch for while using this medication? Your condition will be monitored carefully while you are receiving this medication. You may need blood work while taking this medication. This medication may cause serious skin reactions. They can happen weeks to months after starting the medication. Contact your care team right away if you notice fevers or flu-like symptoms with a rash. The rash may be red or purple and then turn into blisters or peeling of the skin. You may also notice a red rash with swelling of the face, lips, or lymph nodes in your neck or under your arms. Tell your care team right away if you have any change in your eyesight. Talk to your care team if you are pregnant or think you might be pregnant. A negative pregnancy test is required before starting this medication. A reliable form of contraception is recommended while taking this medication and for 5 months after the last dose. Talk to your care team about effective forms of contraception. Do not breast-feed while taking this medication and for 5 months after the last dose. What side effects may I notice from receiving this medication? Side effects that you should report to your care team as soon as possible: Allergic reactions--skin rash, itching, hives, swelling of the face, lips, tongue, or throat Dry cough, shortness of breath or trouble breathing Eye pain, redness, irritation, or discharge with blurry or decreased vision Heart muscle inflammation--unusual weakness or fatigue, shortness of breath, chest pain, fast or irregular heartbeat, dizziness, swelling of the ankles, feet, or hands Hormone  gland problems--headache, sensitivity to light, unusual weakness or fatigue, dizziness, fast or irregular heartbeat, increased sensitivity to cold or heat, excessive sweating, constipation, hair loss, increased thirst or amount of urine,  tremors or shaking, irritability Infusion reactions--chest pain, shortness of breath or trouble breathing, feeling faint or lightheaded Kidney injury (glomerulonephritis)--decrease in the amount of urine, red or dark brown urine, foamy or bubbly urine, swelling of the ankles, hands, or feet Liver injury--right upper belly pain, loss of appetite, nausea, light-colored stool, dark yellow or brown urine, yellowing skin or eyes, unusual weakness or fatigue Pain, tingling, or numbness in the hands or feet, muscle weakness, change in vision, confusion or trouble speaking, loss of balance or coordination, trouble walking, seizures Rash, fever, and swollen lymph nodes Redness, blistering, peeling, or loosening of the skin, including inside the mouth Sudden or severe stomach pain, bloody diarrhea, fever, nausea, vomiting Side effects that usually do not require medical attention (report these to your care team if they continue or are bothersome): Bone, joint, or muscle pain Diarrhea Fatigue Loss of appetite Nausea Skin rash This list may not describe all possible side effects. Call your doctor for medical advice about side effects. You may report side effects to FDA at 1-800-FDA-1088. Where should I keep my medication? This medication is given in a hospital or clinic. It will not be stored at home. NOTE: This sheet is a summary. It may not cover all possible information. If you have questions about this medicine, talk to your doctor, pharmacist, or health care provider.  2024 Elsevier/Gold Standard (2022-04-08 00:00:00)   [facility] [place] [facility]  Discharge Instructions: Thank you for choosing [facility] to provide your oncology and hematology care.  If you have a lab appointment with the Cancer Center, please go directly to the Cancer Center and check in at the registration area.   Wear comfortable clothing and clothing appropriate for easy  access to any Portacath or PICC line.   We strive to give you quality time with your provider. You may need to reschedule your appointment if you arrive late (15 or more minutes).  Arriving late affects you and other patients whose appointments are after yours.  Also, if you miss three or more appointments without notifying the office, you may be dismissed from the clinic at the provider's discretion.      For prescription refill requests, have your pharmacy contact our office and allow 72 hours for refills to be completed.    Today you received the following chemotherapy and/or immunotherapy agents NIVOLUMAB      To help prevent nausea and vomiting after your treatment, we encourage you to take your nausea medication as directed.  BELOW ARE SYMPTOMS THAT SHOULD BE REPORTED IMMEDIATELY: *FEVER GREATER THAN 100.4 F (38 C) OR HIGHER *CHILLS OR SWEATING *NAUSEA AND VOMITING THAT IS NOT CONTROLLED WITH YOUR NAUSEA MEDICATION *UNUSUAL SHORTNESS OF BREATH *UNUSUAL BRUISING OR BLEEDING *URINARY PROBLEMS (pain or burning when urinating, or frequent urination) *BOWEL PROBLEMS (unusual diarrhea, constipation, pain near the anus) TENDERNESS IN MOUTH AND THROAT WITH OR WITHOUT PRESENCE OF ULCERS (sore throat, sores in mouth, or a toothache) UNUSUAL RASH, SWELLING OR PAIN  UNUSUAL VAGINAL DISCHARGE OR ITCHING   Items with * indicate a potential emergency and should be followed up as soon as possible or go to the Emergency Department if any problems should occur.  Please show the CHEMOTHERAPY ALERT CARD or IMMUNOTHERAPY ALERT CARD at  check-in to the Emergency Department and triage nurse.  Should you have questions after your visit or need to cancel or reschedule your appointment, please contact [facility] [place] - [facility]  Dept: [phone]  and follow the prompts.  Office hours are 8:00 a.m. to 4:30 p.m. Monday - Friday. Please note that voicemails left after  4:00 p.m. may not be returned until the following business day.  We are closed weekends and major holidays. You have access to a nurse at all times for urgent questions. Please call the main number to the clinic Dept: [phone] and follow the prompts.  For any non-urgent questions, you may also contact your provider using MyChart. We now offer e-Visits for anyone 58 and older to request care online for non-urgent symptoms. For details visit [email or web address].   Also download the MyChart app! Go to the app store, search "MyChart", open the app, select [place], and log in with your MyChart username and password.

## 2024-12-31 ENCOUNTER — Other Ambulatory Visit: Payer: Self-pay

## 2024-12-31 LAB — T4: T4, Total: 5.3 ug/dL (ref 4.5–12.0)

## 2025-01-01 ENCOUNTER — Other Ambulatory Visit: Payer: Self-pay

## 2025-01-26 ENCOUNTER — Encounter: Payer: Self-pay | Admitting: Oncology

## 2025-01-27 ENCOUNTER — Inpatient Hospital Stay

## 2025-01-27 ENCOUNTER — Encounter: Payer: Self-pay | Admitting: Oncology

## 2025-01-27 ENCOUNTER — Inpatient Hospital Stay: Admitting: Oncology

## 2025-01-27 ENCOUNTER — Other Ambulatory Visit: Payer: Self-pay

## 2025-02-03 ENCOUNTER — Inpatient Hospital Stay: Attending: Oncology

## 2025-02-03 ENCOUNTER — Inpatient Hospital Stay: Admitting: Oncology

## 2025-02-03 ENCOUNTER — Inpatient Hospital Stay

## 2025-02-03 DIAGNOSIS — D3001 Benign neoplasm of right kidney: Secondary | ICD-10-CM
